# Patient Record
Sex: Male | Born: 1955 | Race: Black or African American | Hispanic: No | Marital: Single
Health system: Southern US, Community
[De-identification: ages and names within clinical notes are randomized; demographics above are authoritative.]

---

## 2004-05-12 ENCOUNTER — Other Ambulatory Visit: Payer: Self-pay

## 2004-06-02 ENCOUNTER — Ambulatory Visit: Payer: Self-pay | Admitting: Family Medicine

## 2004-06-14 ENCOUNTER — Ambulatory Visit: Payer: Self-pay | Admitting: Family Medicine

## 2004-07-14 ENCOUNTER — Ambulatory Visit: Payer: Self-pay | Admitting: Family Medicine

## 2004-09-04 ENCOUNTER — Inpatient Hospital Stay: Payer: Self-pay | Admitting: Anesthesiology

## 2004-11-14 ENCOUNTER — Emergency Department: Payer: Self-pay | Admitting: Internal Medicine

## 2007-03-08 ENCOUNTER — Other Ambulatory Visit: Payer: Self-pay

## 2007-03-08 ENCOUNTER — Inpatient Hospital Stay: Payer: Self-pay | Admitting: Internal Medicine

## 2008-03-02 ENCOUNTER — Ambulatory Visit: Payer: Self-pay | Admitting: Family Medicine

## 2008-12-02 ENCOUNTER — Emergency Department: Payer: Self-pay | Admitting: Emergency Medicine

## 2008-12-05 ENCOUNTER — Emergency Department: Payer: Self-pay | Admitting: Emergency Medicine

## 2009-04-26 ENCOUNTER — Ambulatory Visit: Payer: Self-pay | Admitting: Gastroenterology

## 2010-02-15 ENCOUNTER — Ambulatory Visit: Payer: Self-pay | Admitting: Family Medicine

## 2010-07-04 ENCOUNTER — Emergency Department: Payer: Self-pay | Admitting: Emergency Medicine

## 2010-10-16 ENCOUNTER — Emergency Department: Payer: Self-pay | Admitting: Emergency Medicine

## 2010-12-31 ENCOUNTER — Emergency Department: Payer: Self-pay | Admitting: Emergency Medicine

## 2011-01-03 ENCOUNTER — Encounter: Payer: Self-pay | Admitting: Student

## 2011-01-13 ENCOUNTER — Encounter: Payer: Self-pay | Admitting: Student

## 2011-02-06 ENCOUNTER — Emergency Department: Payer: Self-pay | Admitting: Emergency Medicine

## 2011-06-29 ENCOUNTER — Ambulatory Visit: Payer: Self-pay | Admitting: Family Medicine

## 2012-05-01 ENCOUNTER — Inpatient Hospital Stay: Payer: Self-pay | Admitting: Specialist

## 2012-05-01 LAB — CBC WITH DIFFERENTIAL/PLATELET
Basophil #: 0 10*3/uL (ref 0.0–0.1)
Eosinophil #: 0.2 10*3/uL (ref 0.0–0.7)
Eosinophil %: 0.8 %
HCT: 51.1 % (ref 40.0–52.0)
Lymphocyte %: 3.5 %
MCV: 78 fL — ABNORMAL LOW (ref 80–100)
Neutrophil #: 19.6 10*3/uL — ABNORMAL HIGH (ref 1.4–6.5)
Neutrophil %: 89.3 %
RDW: 16.9 % — ABNORMAL HIGH (ref 11.5–14.5)
WBC: 22 10*3/uL — ABNORMAL HIGH (ref 3.8–10.6)

## 2012-05-01 LAB — URINALYSIS, COMPLETE
Nitrite: NEGATIVE
Ph: 6 (ref 4.5–8.0)
Protein: 100
Specific Gravity: 1.016 (ref 1.003–1.030)

## 2012-05-01 LAB — COMPREHENSIVE METABOLIC PANEL
BUN: 70 mg/dL — ABNORMAL HIGH (ref 7–18)
Bilirubin,Total: 0.9 mg/dL (ref 0.2–1.0)
Chloride: 80 mmol/L — ABNORMAL LOW (ref 98–107)
Co2: 15 mmol/L — ABNORMAL LOW (ref 21–32)
Creatinine: 2.88 mg/dL — ABNORMAL HIGH (ref 0.60–1.30)
EGFR (African American): 27 — ABNORMAL LOW
Glucose: 753 mg/dL (ref 65–99)
Potassium: 4.4 mmol/L (ref 3.5–5.1)
SGOT(AST): 15 U/L (ref 15–37)
Sodium: 123 mmol/L — ABNORMAL LOW (ref 136–145)
Total Protein: 7.5 g/dL (ref 6.4–8.2)

## 2012-05-01 LAB — TROPONIN I: Troponin-I: <0.02 ng/mL

## 2012-05-02 LAB — COMPREHENSIVE METABOLIC PANEL
Anion Gap: 9 (ref 7–16)
BUN: 73 mg/dL — ABNORMAL HIGH (ref 7–18)
Bilirubin,Total: 0.4 mg/dL (ref 0.2–1.0)
Chloride: 102 mmol/L (ref 98–107)
Co2: 29 mmol/L (ref 21–32)
Creatinine: 2.78 mg/dL — ABNORMAL HIGH (ref 0.60–1.30)
EGFR (African American): 29 — ABNORMAL LOW
EGFR (Non-African Amer.): 25 — ABNORMAL LOW
Glucose: 67 mg/dL (ref 65–99)
Potassium: 3.7 mmol/L (ref 3.5–5.1)
SGOT(AST): 19 U/L (ref 15–37)
SGPT (ALT): 28 U/L (ref 12–78)
Sodium: 140 mmol/L (ref 136–145)
Total Protein: 6.1 g/dL — ABNORMAL LOW (ref 6.4–8.2)

## 2012-05-02 LAB — CBC WITH DIFFERENTIAL/PLATELET
Basophil #: 0 10*3/uL (ref 0.0–0.1)
Basophil %: 0.1 %
Eosinophil #: 0.2 10*3/uL (ref 0.0–0.7)
HCT: 40.8 % (ref 40.0–52.0)
HGB: 13.4 g/dL (ref 13.0–18.0)
Lymphocyte #: 1.4 10*3/uL (ref 1.0–3.6)
MCH: 23.5 pg — ABNORMAL LOW (ref 26.0–34.0)
MCHC: 32.9 g/dL (ref 32.0–36.0)
Monocyte #: 3 x10 3/mm — ABNORMAL HIGH (ref 0.2–1.0)
Neutrophil #: 18.4 10*3/uL — ABNORMAL HIGH (ref 1.4–6.5)
RBC: 5.73 10*6/uL (ref 4.40–5.90)
RDW: 15.7 % — ABNORMAL HIGH (ref 11.5–14.5)

## 2012-05-02 LAB — LIPID PANEL
Cholesterol: 114 mg/dL (ref 0–200)
HDL Cholesterol: 6 mg/dL — ABNORMAL LOW (ref 40–60)
Triglycerides: 191 mg/dL (ref 0–200)
VLDL Cholesterol, Calc: 38 mg/dL (ref 5–40)

## 2012-05-02 LAB — PHOSPHORUS: Phosphorus: 1.4 mg/dL — ABNORMAL LOW (ref 2.5–4.9)

## 2012-05-02 LAB — MAGNESIUM: Magnesium: 2.3 mg/dL

## 2012-05-03 LAB — CBC WITH DIFFERENTIAL/PLATELET
Basophil #: 0.1 10*3/uL (ref 0.0–0.1)
Eosinophil #: 0.3 10*3/uL (ref 0.0–0.7)
Eosinophil %: 1.8 %
HCT: 38.9 % — ABNORMAL LOW (ref 40.0–52.0)
HGB: 12.4 g/dL — ABNORMAL LOW (ref 13.0–18.0)
Lymphocyte %: 12 %
Monocyte %: 10.4 %
Neutrophil #: 11 10*3/uL — ABNORMAL HIGH (ref 1.4–6.5)
Neutrophil %: 75.4 %
Platelet: 79 10*3/uL — ABNORMAL LOW (ref 150–440)
RBC: 5.43 10*6/uL (ref 4.40–5.90)
RDW: 16.2 % — ABNORMAL HIGH (ref 11.5–14.5)
WBC: 14.6 10*3/uL — ABNORMAL HIGH (ref 3.8–10.6)

## 2012-05-03 LAB — BASIC METABOLIC PANEL
Anion Gap: 7 (ref 7–16)
BUN: 48 mg/dL — ABNORMAL HIGH (ref 7–18)
EGFR (African American): 57 — ABNORMAL LOW
EGFR (Non-African Amer.): 49 — ABNORMAL LOW
Glucose: 238 mg/dL — ABNORMAL HIGH (ref 65–99)
Osmolality: 296 (ref 275–301)
Sodium: 138 mmol/L (ref 136–145)

## 2012-05-03 LAB — URINE CULTURE

## 2012-09-21 ENCOUNTER — Inpatient Hospital Stay: Payer: Self-pay | Admitting: Internal Medicine

## 2012-09-21 LAB — COMPREHENSIVE METABOLIC PANEL
Albumin: 2.3 g/dL — ABNORMAL LOW (ref 3.4–5.0)
Bilirubin,Total: 0.2 mg/dL (ref 0.2–1.0)
Calcium, Total: 8.3 mg/dL — ABNORMAL LOW (ref 8.5–10.1)
Chloride: 109 mmol/L — ABNORMAL HIGH (ref 98–107)
Co2: 25 mmol/L (ref 21–32)
Creatinine: 2.07 mg/dL — ABNORMAL HIGH (ref 0.60–1.30)
EGFR (Non-African Amer.): 35 — ABNORMAL LOW
Glucose: 149 mg/dL — ABNORMAL HIGH (ref 65–99)
Potassium: 4.8 mmol/L (ref 3.5–5.1)
SGOT(AST): 69 U/L — ABNORMAL HIGH (ref 15–37)
SGPT (ALT): 36 U/L (ref 12–78)
Total Protein: 6.6 g/dL (ref 6.4–8.2)

## 2012-09-21 LAB — CBC WITH DIFFERENTIAL/PLATELET
Basophil #: 0.1 x10 3/mm 3 (ref 0.0–0.1)
Basophil %: 1 %
Eosinophil #: 0.1 x10 3/mm 3 (ref 0.0–0.7)
Eosinophil %: 0.5 %
HCT: 37.3 % — ABNORMAL LOW (ref 40.0–52.0)
HGB: 11.9 g/dL — ABNORMAL LOW (ref 13.0–18.0)
Lymphocyte %: 17.6 %
Lymphs Abs: 2.3 x10 3/mm 3 (ref 1.0–3.6)
MCH: 23.4 pg — ABNORMAL LOW (ref 26.0–34.0)
MCHC: 32 g/dL (ref 32.0–36.0)
MCV: 73 fL — ABNORMAL LOW (ref 80–100)
Monocyte #: 0.6 x10 3/mm (ref 0.2–1.0)
Monocyte %: 4.4 %
Neutrophil #: 10 x10 3/mm 3 — ABNORMAL HIGH (ref 1.4–6.5)
Neutrophil %: 76.5 %
Platelet: 248 x10 3/mm 3 (ref 150–440)
RBC: 5.1 x10 6/mm 3 (ref 4.40–5.90)
RDW: 14.8 % — ABNORMAL HIGH (ref 11.5–14.5)
WBC: 13.1 x10 3/mm 3 — ABNORMAL HIGH (ref 3.8–10.6)

## 2012-09-21 LAB — URINALYSIS, COMPLETE
Ketone: NEGATIVE
Nitrite: POSITIVE
Ph: 6 (ref 4.5–8.0)
Protein: 100
Specific Gravity: 1.01 (ref 1.003–1.030)
Squamous Epithelial: 2

## 2012-09-22 LAB — CBC WITH DIFFERENTIAL/PLATELET
Basophil: 4 %
Eosinophil: 6 %
HCT: 35.7 % — ABNORMAL LOW (ref 40.0–52.0)
HGB: 11.5 g/dL — ABNORMAL LOW (ref 13.0–18.0)
Lymphocytes: 40 %
MCH: 23.9 pg — ABNORMAL LOW (ref 26.0–34.0)
MCHC: 32.2 g/dL (ref 32.0–36.0)
MCV: 74 fL — ABNORMAL LOW (ref 80–100)
Monocytes: 5 %
Platelet: 273 10*3/uL (ref 150–440)
RBC: 4.82 10*6/uL (ref 4.40–5.90)
RDW: 15 % — ABNORMAL HIGH (ref 11.5–14.5)
Segmented Neutrophils: 31 %
Variant Lymphocyte - H1-Rlymph: 14 %
WBC: 10.3 10*3/uL (ref 3.8–10.6)

## 2012-09-22 LAB — BASIC METABOLIC PANEL
Calcium, Total: 7.9 mg/dL — ABNORMAL LOW (ref 8.5–10.1)
Chloride: 113 mmol/L — ABNORMAL HIGH (ref 98–107)
Co2: 24 mmol/L (ref 21–32)
Creatinine: 2.11 mg/dL — ABNORMAL HIGH (ref 0.60–1.30)
EGFR (African American): 40 — ABNORMAL LOW
EGFR (Non-African Amer.): 34 — ABNORMAL LOW
Osmolality: 282 (ref 275–301)
Potassium: 5.1 mmol/L (ref 3.5–5.1)
Sodium: 142 mmol/L (ref 136–145)

## 2012-09-22 LAB — CK: CK, Total: 1458 U/L — ABNORMAL HIGH (ref 35–232)

## 2012-09-23 LAB — BASIC METABOLIC PANEL
Creatinine: 2 mg/dL — ABNORMAL HIGH (ref 0.60–1.30)
EGFR (Non-African Amer.): 37 — ABNORMAL LOW
Osmolality: 292 (ref 275–301)
Sodium: 142 mmol/L (ref 136–145)

## 2012-09-23 LAB — CK: CK, Total: 813 U/L — ABNORMAL HIGH (ref 35–232)

## 2012-09-24 LAB — BASIC METABOLIC PANEL
BUN: 28 mg/dL — ABNORMAL HIGH (ref 7–18)
Chloride: 109 mmol/L — ABNORMAL HIGH (ref 98–107)
Co2: 21 mmol/L (ref 21–32)
Creatinine: 2.26 mg/dL — ABNORMAL HIGH (ref 0.60–1.30)
EGFR (African American): 37 — ABNORMAL LOW
Glucose: 409 mg/dL — ABNORMAL HIGH (ref 65–99)
Osmolality: 297 (ref 275–301)
Potassium: 5.4 mmol/L — ABNORMAL HIGH (ref 3.5–5.1)
Sodium: 137 mmol/L (ref 136–145)

## 2012-09-24 LAB — CBC WITH DIFFERENTIAL/PLATELET
Basophil #: 0 10*3/uL (ref 0.0–0.1)
Basophil %: 0.4 %
Eosinophil #: 0 10*3/uL (ref 0.0–0.7)
Eosinophil %: 0.1 %
Lymphocyte %: 17.5 %
MCHC: 31.9 g/dL — ABNORMAL LOW (ref 32.0–36.0)
MCV: 74 fL — ABNORMAL LOW (ref 80–100)
Monocyte #: 0.4 x10 3/mm (ref 0.2–1.0)
Monocyte %: 3.4 %
Neutrophil #: 8.1 10*3/uL — ABNORMAL HIGH (ref 1.4–6.5)
Neutrophil %: 78.6 %
Platelet: 222 10*3/uL (ref 150–440)
RBC: 4.79 10*6/uL (ref 4.40–5.90)
WBC: 10.3 10*3/uL (ref 3.8–10.6)

## 2012-09-24 LAB — POTASSIUM: Potassium: 5 mmol/L (ref 3.5–5.1)

## 2012-09-26 LAB — CULTURE, BLOOD (SINGLE)

## 2012-11-09 ENCOUNTER — Other Ambulatory Visit: Payer: Self-pay

## 2012-11-09 LAB — URINALYSIS, COMPLETE
Glucose,UR: NEGATIVE mg/dL (ref 0–75)
Hyaline Cast: 5
Ketone: NEGATIVE
Nitrite: NEGATIVE
Protein: >=500
Squamous Epithelial: 1

## 2012-11-11 LAB — URINE CULTURE

## 2012-11-23 ENCOUNTER — Other Ambulatory Visit: Payer: Self-pay

## 2012-11-23 LAB — BASIC METABOLIC PANEL
Anion Gap: 2 — ABNORMAL LOW (ref 7–16)
Chloride: 111 mmol/L — ABNORMAL HIGH (ref 98–107)
Creatinine: 2.12 mg/dL — ABNORMAL HIGH (ref 0.60–1.30)
EGFR (African American): 39 — ABNORMAL LOW
EGFR (Non-African Amer.): 34 — ABNORMAL LOW
Potassium: 5.2 mmol/L — ABNORMAL HIGH (ref 3.5–5.1)
Sodium: 138 mmol/L (ref 136–145)

## 2012-12-22 LAB — CBC WITH DIFFERENTIAL/PLATELET
Basophil #: 0.1 10*3/uL (ref 0.0–0.1)
Basophil %: 0.7 %
Eosinophil %: 0.4 %
HCT: 41.3 % (ref 40.0–52.0)
Lymphocyte #: 2.2 10*3/uL (ref 1.0–3.6)
MCH: 23.3 pg — ABNORMAL LOW (ref 26.0–34.0)
MCHC: 32.3 g/dL (ref 32.0–36.0)
MCV: 72 fL — ABNORMAL LOW (ref 80–100)
Monocyte %: 7.4 %
Neutrophil %: 74 %
Platelet: 258 10*3/uL (ref 150–440)
RDW: 16.1 % — ABNORMAL HIGH (ref 11.5–14.5)
WBC: 12.7 10*3/uL — ABNORMAL HIGH (ref 3.8–10.6)

## 2012-12-22 LAB — URINALYSIS, COMPLETE
Glucose,UR: >=500 mg/dL (ref 0–75)
Leukocyte Esterase: NEGATIVE
Nitrite: NEGATIVE
RBC,UR: 13 /HPF (ref 0–5)
Specific Gravity: 1.014 (ref 1.003–1.030)
Squamous Epithelial: <1

## 2012-12-22 LAB — COMPREHENSIVE METABOLIC PANEL
Albumin: 2.2 g/dL — ABNORMAL LOW (ref 3.4–5.0)
Anion Gap: 6 — ABNORMAL LOW (ref 7–16)
Bilirubin,Total: 0.3 mg/dL (ref 0.2–1.0)
Co2: 25 mmol/L (ref 21–32)
EGFR (Non-African Amer.): 34 — ABNORMAL LOW
Glucose: 413 mg/dL — ABNORMAL HIGH (ref 65–99)
Osmolality: 297 (ref 275–301)
SGPT (ALT): 35 U/L (ref 12–78)
Total Protein: 6.3 g/dL — ABNORMAL LOW (ref 6.4–8.2)

## 2012-12-22 LAB — TSH: Thyroid Stimulating Horm: 1.26 u[IU]/mL

## 2012-12-22 LAB — BETA-HYDROXYBUTYRIC ACID: Beta-Hydroxybutyrate: 3.2 mg/dL — ABNORMAL HIGH (ref 0.2–2.8)

## 2012-12-23 ENCOUNTER — Inpatient Hospital Stay: Payer: Self-pay | Admitting: Internal Medicine

## 2012-12-24 LAB — DRUG SCREEN, URINE
Barbiturates, Ur Screen: NEGATIVE (ref ?–200)
Benzodiazepine, Ur Scrn: NEGATIVE (ref ?–200)
Cannabinoid 50 Ng, Ur ~~LOC~~: NEGATIVE (ref ?–50)
Opiate, Ur Screen: NEGATIVE (ref ?–300)

## 2012-12-24 LAB — CBC WITH DIFFERENTIAL/PLATELET
Basophil #: 0.1 10*3/uL (ref 0.0–0.1)
Eosinophil #: 0.3 10*3/uL (ref 0.0–0.7)
Eosinophil %: 2.6 %
Lymphocyte %: 34.5 %
MCH: 23.5 pg — ABNORMAL LOW (ref 26.0–34.0)
MCHC: 32.7 g/dL (ref 32.0–36.0)
MCV: 72 fL — ABNORMAL LOW (ref 80–100)
Monocyte #: 0.7 x10 3/mm (ref 0.2–1.0)
Neutrophil #: 5.8 10*3/uL (ref 1.4–6.5)
Neutrophil %: 54.9 %
Platelet: 253 10*3/uL (ref 150–440)
RBC: 5.81 10*6/uL (ref 4.40–5.90)

## 2012-12-24 LAB — BASIC METABOLIC PANEL
Anion Gap: 7 (ref 7–16)
BUN: 32 mg/dL — ABNORMAL HIGH (ref 7–18)
Chloride: 110 mmol/L — ABNORMAL HIGH (ref 98–107)
Co2: 22 mmol/L (ref 21–32)
Creatinine: 2.14 mg/dL — ABNORMAL HIGH (ref 0.60–1.30)
EGFR (African American): 39 — ABNORMAL LOW
EGFR (Non-African Amer.): 34 — ABNORMAL LOW
Glucose: 263 mg/dL — ABNORMAL HIGH (ref 65–99)
Sodium: 139 mmol/L (ref 136–145)

## 2012-12-28 LAB — CULTURE, BLOOD (SINGLE)

## 2013-02-20 ENCOUNTER — Inpatient Hospital Stay: Payer: Self-pay | Admitting: Specialist

## 2013-02-20 LAB — URINALYSIS, COMPLETE
Bilirubin,UR: NEGATIVE
Glucose,UR: NEGATIVE mg/dL (ref 0–75)
Nitrite: NEGATIVE
Ph: 5 (ref 4.5–8.0)
Protein: >=500
Specific Gravity: 1.018 (ref 1.003–1.030)
WBC UR: 688 /HPF (ref 0–5)

## 2013-02-20 LAB — DRUG SCREEN, URINE
Amphetamines, Ur Screen: NEGATIVE (ref ?–1000)
Barbiturates, Ur Screen: NEGATIVE (ref ?–200)
Benzodiazepine, Ur Scrn: NEGATIVE (ref ?–200)
Cocaine Metabolite,Ur ~~LOC~~: POSITIVE (ref ?–300)
Methadone, Ur Screen: NEGATIVE (ref ?–300)
Opiate, Ur Screen: NEGATIVE (ref ?–300)
Phencyclidine (PCP) Ur S: NEGATIVE (ref ?–25)

## 2013-02-20 LAB — BASIC METABOLIC PANEL
Anion Gap: 5 — ABNORMAL LOW (ref 7–16)
Chloride: 113 mmol/L — ABNORMAL HIGH (ref 98–107)
Creatinine: 2.21 mg/dL — ABNORMAL HIGH (ref 0.60–1.30)
EGFR (African American): 37 — ABNORMAL LOW
EGFR (Non-African Amer.): 32 — ABNORMAL LOW
Glucose: 41 mg/dL — ABNORMAL LOW (ref 65–99)
Osmolality: 289 (ref 275–301)
Potassium: 5.2 mmol/L — ABNORMAL HIGH (ref 3.5–5.1)
Sodium: 144 mmol/L (ref 136–145)

## 2013-02-20 LAB — CBC
HCT: 40.9 % (ref 40.0–52.0)
HGB: 13.2 g/dL (ref 13.0–18.0)
MCHC: 32.2 g/dL (ref 32.0–36.0)
MCV: 72 fL — ABNORMAL LOW (ref 80–100)
Platelet: 355 10*3/uL (ref 150–440)
RDW: 16 % — ABNORMAL HIGH (ref 11.5–14.5)

## 2013-02-20 LAB — DIFFERENTIAL
Eosinophil #: 0 10*3/uL (ref 0.0–0.7)
Lymphocyte #: 1.9 10*3/uL (ref 1.0–3.6)
Neutrophil %: 90.9 %

## 2013-02-21 LAB — CBC WITH DIFFERENTIAL/PLATELET
Basophil %: 0.9 %
Eosinophil #: 0.1 10*3/uL (ref 0.0–0.7)
HGB: 11.8 g/dL — ABNORMAL LOW (ref 13.0–18.0)
Lymphocyte #: 3.1 10*3/uL (ref 1.0–3.6)
MCH: 23.3 pg — ABNORMAL LOW (ref 26.0–34.0)
MCHC: 32 g/dL (ref 32.0–36.0)
MCV: 73 fL — ABNORMAL LOW (ref 80–100)
Monocyte #: 1.3 x10 3/mm — ABNORMAL HIGH (ref 0.2–1.0)
Monocyte %: 7.2 %
RBC: 5.07 10*6/uL (ref 4.40–5.90)

## 2013-02-21 LAB — BASIC METABOLIC PANEL
Anion Gap: 8 (ref 7–16)
BUN: 32 mg/dL — ABNORMAL HIGH (ref 7–18)
Calcium, Total: 7.9 mg/dL — ABNORMAL LOW (ref 8.5–10.1)
Chloride: 113 mmol/L — ABNORMAL HIGH (ref 98–107)
Creatinine: 2.44 mg/dL — ABNORMAL HIGH (ref 0.60–1.30)
EGFR (Non-African Amer.): 29 — ABNORMAL LOW
Glucose: 16 mg/dL — CL (ref 65–99)
Osmolality: 295 (ref 275–301)
Potassium: 3.9 mmol/L (ref 3.5–5.1)
Sodium: 147 mmol/L — ABNORMAL HIGH (ref 136–145)

## 2013-02-21 LAB — MAGNESIUM: Magnesium: 1.8 mg/dL

## 2013-02-21 LAB — HEMOGLOBIN A1C: Hemoglobin A1C: 9.3 % — ABNORMAL HIGH (ref 4.2–6.3)

## 2013-02-22 LAB — CBC WITH DIFFERENTIAL/PLATELET
Basophil #: 0.2 10*3/uL — ABNORMAL HIGH (ref 0.0–0.1)
Eosinophil #: 0.4 10*3/uL (ref 0.0–0.7)
Eosinophil %: 3 %
HCT: 35.4 % — ABNORMAL LOW (ref 40.0–52.0)
Lymphocyte #: 5.1 10*3/uL — ABNORMAL HIGH (ref 1.0–3.6)
MCV: 72 fL — ABNORMAL LOW (ref 80–100)
Monocyte %: 8 %
Neutrophil #: 7.8 10*3/uL — ABNORMAL HIGH (ref 1.4–6.5)
Neutrophil %: 53.2 %
Platelet: 277 10*3/uL (ref 150–440)
RBC: 4.9 10*6/uL (ref 4.40–5.90)
RDW: 15.7 % — ABNORMAL HIGH (ref 11.5–14.5)

## 2013-02-22 LAB — BASIC METABOLIC PANEL
Anion Gap: 4 — ABNORMAL LOW (ref 7–16)
Calcium, Total: 7.8 mg/dL — ABNORMAL LOW (ref 8.5–10.1)
EGFR (African American): 30 — ABNORMAL LOW
EGFR (Non-African Amer.): 26 — ABNORMAL LOW
Glucose: 124 mg/dL — ABNORMAL HIGH (ref 65–99)
Osmolality: 293 (ref 275–301)

## 2013-02-22 LAB — URINE CULTURE

## 2013-02-23 LAB — CBC WITH DIFFERENTIAL/PLATELET
Eosinophil #: 0.7 10*3/uL (ref 0.0–0.7)
HCT: 36.7 % — ABNORMAL LOW (ref 40.0–52.0)
HGB: 11.7 g/dL — ABNORMAL LOW (ref 13.0–18.0)
Lymphocyte #: 4 10*3/uL — ABNORMAL HIGH (ref 1.0–3.6)
MCHC: 31.9 g/dL — ABNORMAL LOW (ref 32.0–36.0)
MCV: 72 fL — ABNORMAL LOW (ref 80–100)
Monocyte #: 1 x10 3/mm (ref 0.2–1.0)
Monocyte %: 7.9 %
RBC: 5.09 10*6/uL (ref 4.40–5.90)
WBC: 12.7 10*3/uL — ABNORMAL HIGH (ref 3.8–10.6)

## 2013-02-23 LAB — BASIC METABOLIC PANEL
BUN: 31 mg/dL — ABNORMAL HIGH (ref 7–18)
Co2: 26 mmol/L (ref 21–32)
Osmolality: 294 (ref 275–301)
Sodium: 143 mmol/L (ref 136–145)

## 2013-05-13 ENCOUNTER — Inpatient Hospital Stay: Payer: Self-pay | Admitting: Internal Medicine

## 2013-05-13 LAB — PROTIME-INR
INR: 1.1
Prothrombin Time: 14.4 secs (ref 11.5–14.7)

## 2013-05-13 LAB — BASIC METABOLIC PANEL
Chloride: 114 mmol/L — ABNORMAL HIGH (ref 98–107)
EGFR (African American): 30 — ABNORMAL LOW
EGFR (Non-African Amer.): 26 — ABNORMAL LOW
Osmolality: 284 (ref 275–301)
Potassium: 4.4 mmol/L (ref 3.5–5.1)
Sodium: 143 mmol/L (ref 136–145)

## 2013-05-13 LAB — CBC WITH DIFFERENTIAL/PLATELET
Basophil #: 0.1 10*3/uL (ref 0.0–0.1)
Eosinophil %: 3.7 %
HCT: 38.4 % — ABNORMAL LOW (ref 40.0–52.0)
Lymphocyte #: 4.3 10*3/uL — ABNORMAL HIGH (ref 1.0–3.6)
Lymphocyte %: 41.9 %
MCH: 23.2 pg — ABNORMAL LOW (ref 26.0–34.0)
MCV: 72 fL — ABNORMAL LOW (ref 80–100)
Neutrophil #: 4.7 10*3/uL (ref 1.4–6.5)
RBC: 5.32 10*6/uL (ref 4.40–5.90)
RDW: 15.9 % — ABNORMAL HIGH (ref 11.5–14.5)

## 2013-05-13 LAB — MAGNESIUM: Magnesium: 1.5 mg/dL — ABNORMAL LOW

## 2013-05-13 LAB — APTT: Activated PTT: 36.5 secs — ABNORMAL HIGH (ref 23.6–35.9)

## 2013-05-14 LAB — TSH: Thyroid Stimulating Horm: 0.754 u[IU]/mL

## 2013-05-14 LAB — BASIC METABOLIC PANEL
Anion Gap: 5 — ABNORMAL LOW (ref 7–16)
Calcium, Total: 8.5 mg/dL (ref 8.5–10.1)
Co2: 24 mmol/L (ref 21–32)
EGFR (African American): 39 — ABNORMAL LOW
EGFR (Non-African Amer.): 34 — ABNORMAL LOW
Glucose: 134 mg/dL — ABNORMAL HIGH (ref 65–99)
Osmolality: 284 (ref 275–301)

## 2013-05-14 LAB — CBC WITH DIFFERENTIAL/PLATELET
HCT: 37.9 % — ABNORMAL LOW (ref 40.0–52.0)
HGB: 12.3 g/dL — ABNORMAL LOW (ref 13.0–18.0)
Lymphocyte #: 3.2 10*3/uL (ref 1.0–3.6)
Lymphocyte %: 22.9 %
MCH: 23.3 pg — ABNORMAL LOW (ref 26.0–34.0)
MCHC: 32.4 g/dL (ref 32.0–36.0)
MCV: 72 fL — ABNORMAL LOW (ref 80–100)
Monocyte #: 0.7 x10 3/mm (ref 0.2–1.0)
Neutrophil #: 9.8 10*3/uL — ABNORMAL HIGH (ref 1.4–6.5)
Neutrophil %: 69.6 %
Platelet: 284 10*3/uL (ref 150–440)
WBC: 14.1 10*3/uL — ABNORMAL HIGH (ref 3.8–10.6)

## 2013-05-14 LAB — LIPID PANEL
Cholesterol: 133 mg/dL (ref 0–200)
HDL Cholesterol: 29 mg/dL — ABNORMAL LOW (ref 40–60)
Ldl Cholesterol, Calc: 85 mg/dL (ref 0–100)
Triglycerides: 93 mg/dL (ref 0–200)

## 2013-05-16 ENCOUNTER — Ambulatory Visit: Payer: Self-pay | Admitting: Internal Medicine

## 2013-05-16 LAB — CBC WITH DIFFERENTIAL/PLATELET
Basophil #: 0.1 10*3/uL (ref 0.0–0.1)
Basophil %: 0.6 %
Eosinophil %: 0.9 %
HCT: 38.2 % — ABNORMAL LOW (ref 40.0–52.0)
HGB: 12.2 g/dL — ABNORMAL LOW (ref 13.0–18.0)
Lymphocyte #: 2.6 10*3/uL (ref 1.0–3.6)
Lymphocyte %: 18.7 %
MCH: 23.2 pg — ABNORMAL LOW (ref 26.0–34.0)
MCHC: 32 g/dL (ref 32.0–36.0)
Monocyte #: 0.9 x10 3/mm (ref 0.2–1.0)
Neutrophil #: 10.1 10*3/uL — ABNORMAL HIGH (ref 1.4–6.5)
Neutrophil %: 73.4 %
RBC: 5.26 10*6/uL (ref 4.40–5.90)

## 2013-05-16 LAB — BASIC METABOLIC PANEL
Anion Gap: 10 (ref 7–16)
Calcium, Total: 8.8 mg/dL (ref 8.5–10.1)
Chloride: 117 mmol/L — ABNORMAL HIGH (ref 98–107)
Co2: 18 mmol/L — ABNORMAL LOW (ref 21–32)
Creatinine: 2.29 mg/dL — ABNORMAL HIGH (ref 0.60–1.30)
EGFR (African American): 36 — ABNORMAL LOW
Glucose: 188 mg/dL — ABNORMAL HIGH (ref 65–99)
Osmolality: 298 (ref 275–301)
Potassium: 5.6 mmol/L — ABNORMAL HIGH (ref 3.5–5.1)

## 2013-05-16 LAB — MAGNESIUM: Magnesium: 2.1 mg/dL

## 2013-05-16 LAB — POTASSIUM: Potassium: 5.7 mmol/L — ABNORMAL HIGH (ref 3.5–5.1)

## 2013-05-17 LAB — CBC WITH DIFFERENTIAL/PLATELET
Basophil #: 0.1 10*3/uL (ref 0.0–0.1)
Basophil %: 0.5 %
Eosinophil %: 2.9 %
HGB: 11.8 g/dL — ABNORMAL LOW (ref 13.0–18.0)
Lymphocyte %: 25.8 %
MCH: 23.4 pg — ABNORMAL LOW (ref 26.0–34.0)
MCHC: 32.3 g/dL (ref 32.0–36.0)
Monocyte #: 1 x10 3/mm (ref 0.2–1.0)
Monocyte %: 7.9 %
Neutrophil %: 62.9 %
Platelet: 286 10*3/uL (ref 150–440)
RDW: 16 % — ABNORMAL HIGH (ref 11.5–14.5)
WBC: 12.2 10*3/uL — ABNORMAL HIGH (ref 3.8–10.6)

## 2013-05-17 LAB — BASIC METABOLIC PANEL
Anion Gap: 10 (ref 7–16)
BUN: 25 mg/dL — ABNORMAL HIGH (ref 7–18)
Calcium, Total: 8.7 mg/dL (ref 8.5–10.1)
Chloride: 122 mmol/L — ABNORMAL HIGH (ref 98–107)
Co2: 18 mmol/L — ABNORMAL LOW (ref 21–32)
EGFR (African American): 36 — ABNORMAL LOW
EGFR (Non-African Amer.): 31 — ABNORMAL LOW
Glucose: 165 mg/dL — ABNORMAL HIGH (ref 65–99)
Potassium: 5 mmol/L (ref 3.5–5.1)
Sodium: 150 mmol/L — ABNORMAL HIGH (ref 136–145)

## 2013-05-18 LAB — BASIC METABOLIC PANEL
Anion Gap: 11 (ref 7–16)
BUN: 23 mg/dL — ABNORMAL HIGH (ref 7–18)
Calcium, Total: 8.8 mg/dL (ref 8.5–10.1)
Chloride: 119 mmol/L — ABNORMAL HIGH (ref 98–107)
Creatinine: 2.2 mg/dL — ABNORMAL HIGH (ref 0.60–1.30)
EGFR (African American): 38 — ABNORMAL LOW
EGFR (Non-African Amer.): 33 — ABNORMAL LOW
Glucose: 198 mg/dL — ABNORMAL HIGH (ref 65–99)
Osmolality: 303 (ref 275–301)
Potassium: 4.4 mmol/L (ref 3.5–5.1)

## 2013-05-19 LAB — BASIC METABOLIC PANEL
Anion Gap: 9 (ref 7–16)
BUN: 25 mg/dL — ABNORMAL HIGH (ref 7–18)
Calcium, Total: 8.3 mg/dL — ABNORMAL LOW (ref 8.5–10.1)
Chloride: 122 mmol/L — ABNORMAL HIGH (ref 98–107)
Co2: 17 mmol/L — ABNORMAL LOW (ref 21–32)
Creatinine: 3.8 mg/dL — ABNORMAL HIGH (ref 0.60–1.30)
EGFR (Non-African Amer.): 17 — ABNORMAL LOW
Osmolality: 307 (ref 275–301)
Potassium: 5.8 mmol/L — ABNORMAL HIGH (ref 3.5–5.1)

## 2013-05-20 LAB — BASIC METABOLIC PANEL
Anion Gap: 7 (ref 7–16)
Chloride: 113 mmol/L — ABNORMAL HIGH (ref 98–107)
Co2: 21 mmol/L (ref 21–32)
Creatinine: 2.14 mg/dL — ABNORMAL HIGH (ref 0.60–1.30)
EGFR (Non-African Amer.): 34 — ABNORMAL LOW
Osmolality: 290 (ref 275–301)
Potassium: 3.9 mmol/L (ref 3.5–5.1)
Sodium: 141 mmol/L (ref 136–145)

## 2013-05-20 LAB — PHOSPHORUS: Phosphorus: 3.1 mg/dL (ref 2.5–4.9)

## 2013-05-21 LAB — PLATELET COUNT: Platelet: 202 10*3/uL (ref 150–440)

## 2013-06-04 LAB — COMPREHENSIVE METABOLIC PANEL
Albumin: 2.9 g/dL — ABNORMAL LOW (ref 3.4–5.0)
Anion Gap: 3 — ABNORMAL LOW (ref 7–16)
Calcium, Total: 10 mg/dL (ref 8.5–10.1)
Co2: 24 mmol/L (ref 21–32)
Creatinine: 3.69 mg/dL — ABNORMAL HIGH (ref 0.60–1.30)
EGFR (African American): 20 — ABNORMAL LOW
Glucose: 306 mg/dL — ABNORMAL HIGH (ref 65–99)
Osmolality: 325 (ref 275–301)
Potassium: 7.7 mmol/L (ref 3.5–5.1)
Sodium: 144 mmol/L (ref 136–145)

## 2013-06-04 LAB — CBC WITH DIFFERENTIAL/PLATELET
Eosinophil #: 0.1 10*3/uL (ref 0.0–0.7)
Eosinophil %: 0.8 %
HCT: 46.3 % (ref 40.0–52.0)
HGB: 14.7 g/dL (ref 13.0–18.0)
Lymphocyte #: 2.7 10*3/uL (ref 1.0–3.6)
MCHC: 31.7 g/dL — ABNORMAL LOW (ref 32.0–36.0)
MCV: 74 fL — ABNORMAL LOW (ref 80–100)
Monocyte #: 0.8 x10 3/mm (ref 0.2–1.0)
Monocyte %: 4.6 %
Neutrophil #: 13.9 10*3/uL — ABNORMAL HIGH (ref 1.4–6.5)
Neutrophil %: 79 %
RBC: 6.26 10*6/uL — ABNORMAL HIGH (ref 4.40–5.90)
WBC: 17.6 10*3/uL — ABNORMAL HIGH (ref 3.8–10.6)

## 2013-06-05 ENCOUNTER — Inpatient Hospital Stay: Payer: Self-pay | Admitting: Internal Medicine

## 2013-06-05 LAB — URINALYSIS, COMPLETE
Bilirubin,UR: NEGATIVE
Glucose,UR: NEGATIVE mg/dL (ref 0–75)
Hyaline Cast: 4
Ketone: NEGATIVE
Nitrite: NEGATIVE
Ph: 6 (ref 4.5–8.0)
RBC,UR: 6 /HPF (ref 0–5)

## 2013-06-05 LAB — BASIC METABOLIC PANEL
Anion Gap: 4 — ABNORMAL LOW (ref 7–16)
Anion Gap: 5 — ABNORMAL LOW (ref 7–16)
BUN: 90 mg/dL — ABNORMAL HIGH (ref 7–18)
Calcium, Total: 9.3 mg/dL (ref 8.5–10.1)
Chloride: 119 mmol/L — ABNORMAL HIGH (ref 98–107)
Chloride: 119 mmol/L — ABNORMAL HIGH (ref 98–107)
Co2: 24 mmol/L (ref 21–32)
Creatinine: 3.78 mg/dL — ABNORMAL HIGH (ref 0.60–1.30)
Creatinine: 3.85 mg/dL — ABNORMAL HIGH (ref 0.60–1.30)
EGFR (African American): 20 — ABNORMAL LOW
EGFR (African American): 19 — ABNORMAL LOW
Glucose: 101 mg/dL — ABNORMAL HIGH (ref 65–99)
Osmolality: 330 (ref 275–301)
Osmolality: 322 (ref 275–301)
Potassium: 7.1 mmol/L (ref 3.5–5.1)
Potassium: 7.9 mmol/L (ref 3.5–5.1)
Sodium: 148 mmol/L — ABNORMAL HIGH (ref 136–145)
Sodium: 148 mmol/L — ABNORMAL HIGH (ref 136–145)

## 2013-06-05 LAB — POTASSIUM
Potassium: 7.3 mmol/L (ref 3.5–5.1)
Potassium: 7.4 mmol/L (ref 3.5–5.1)

## 2013-06-05 LAB — CK: CK, Total: 166 U/L (ref 35–232)

## 2013-06-06 LAB — RENAL FUNCTION PANEL
Anion Gap: 6 — ABNORMAL LOW (ref 7–16)
Co2: 27 mmol/L (ref 21–32)
Creatinine: 3.74 mg/dL — ABNORMAL HIGH (ref 0.60–1.30)
EGFR (African American): 20 — ABNORMAL LOW
EGFR (Non-African Amer.): 17 — ABNORMAL LOW
Glucose: 483 mg/dL — ABNORMAL HIGH (ref 65–99)
Osmolality: 333 (ref 275–301)
Sodium: 146 mmol/L — ABNORMAL HIGH (ref 136–145)

## 2013-06-06 LAB — CBC WITH DIFFERENTIAL/PLATELET
Basophil #: 0.2 10*3/uL — ABNORMAL HIGH (ref 0.0–0.1)
Eosinophil %: 0.7 %
HCT: 36.4 % — ABNORMAL LOW (ref 40.0–52.0)
HGB: 11.3 g/dL — ABNORMAL LOW (ref 13.0–18.0)
Lymphocyte #: 1.8 10*3/uL (ref 1.0–3.6)
MCHC: 30.9 g/dL — ABNORMAL LOW (ref 32.0–36.0)
Monocyte #: 1.4 x10 3/mm — ABNORMAL HIGH (ref 0.2–1.0)
Neutrophil #: 17.8 10*3/uL — ABNORMAL HIGH (ref 1.4–6.5)
Platelet: 280 10*3/uL (ref 150–440)
RBC: 4.87 10*6/uL (ref 4.40–5.90)
RDW: 16 % — ABNORMAL HIGH (ref 11.5–14.5)
WBC: 21.3 10*3/uL — ABNORMAL HIGH (ref 3.8–10.6)

## 2013-06-07 LAB — RENAL FUNCTION PANEL
Albumin: 2 g/dL — ABNORMAL LOW (ref 3.4–5.0)
Anion Gap: 5 — ABNORMAL LOW (ref 7–16)
Calcium, Total: 8.5 mg/dL (ref 8.5–10.1)
Chloride: 110 mmol/L — ABNORMAL HIGH (ref 98–107)
Creatinine: 2.96 mg/dL — ABNORMAL HIGH (ref 0.60–1.30)
EGFR (African American): 26 — ABNORMAL LOW
EGFR (Non-African Amer.): 23 — ABNORMAL LOW
Glucose: 306 mg/dL — ABNORMAL HIGH (ref 65–99)
Osmolality: 313 (ref 275–301)
Potassium: 4.2 mmol/L (ref 3.5–5.1)

## 2013-06-08 LAB — CBC WITH DIFFERENTIAL/PLATELET
Basophil #: 0.1 10*3/uL (ref 0.0–0.1)
Basophil %: 0.8 %
HGB: 10.5 g/dL — ABNORMAL LOW (ref 13.0–18.0)
Lymphocyte #: 2.3 10*3/uL (ref 1.0–3.6)
Lymphocyte %: 12.7 %
MCHC: 31 g/dL — ABNORMAL LOW (ref 32.0–36.0)
MCV: 74 fL — ABNORMAL LOW (ref 80–100)
Monocyte #: 1.9 x10 3/mm — ABNORMAL HIGH (ref 0.2–1.0)
Neutrophil %: 74.6 %
Platelet: 224 10*3/uL (ref 150–440)

## 2013-06-08 LAB — BASIC METABOLIC PANEL
Anion Gap: 9 (ref 7–16)
BUN: 51 mg/dL — ABNORMAL HIGH (ref 7–18)
Co2: 25 mmol/L (ref 21–32)
Creatinine: 3.26 mg/dL — ABNORMAL HIGH (ref 0.60–1.30)
EGFR (African American): 23 — ABNORMAL LOW
EGFR (Non-African Amer.): 20 — ABNORMAL LOW
Glucose: 276 mg/dL — ABNORMAL HIGH (ref 65–99)
Osmolality: 318 (ref 275–301)
Potassium: 4.2 mmol/L (ref 3.5–5.1)
Sodium: 148 mmol/L — ABNORMAL HIGH (ref 136–145)

## 2013-06-09 LAB — CBC WITH DIFFERENTIAL/PLATELET
Basophil #: 0.2 10*3/uL — ABNORMAL HIGH (ref 0.0–0.1)
Eosinophil #: 0.9 10*3/uL — ABNORMAL HIGH (ref 0.0–0.7)
Eosinophil %: 6.5 %
HCT: 35.9 % — ABNORMAL LOW (ref 40.0–52.0)
HGB: 11.2 g/dL — ABNORMAL LOW (ref 13.0–18.0)
Lymphocyte #: 2 10*3/uL (ref 1.0–3.6)
MCHC: 31.1 g/dL — ABNORMAL LOW (ref 32.0–36.0)
MCV: 74 fL — ABNORMAL LOW (ref 80–100)
Monocyte %: 11.7 %
Neutrophil %: 66.5 %
Platelet: 234 10*3/uL (ref 150–440)
RDW: 16.1 % — ABNORMAL HIGH (ref 11.5–14.5)

## 2013-06-09 LAB — BASIC METABOLIC PANEL
Chloride: 116 mmol/L — ABNORMAL HIGH (ref 98–107)
Co2: 26 mmol/L (ref 21–32)
Creatinine: 2.9 mg/dL — ABNORMAL HIGH (ref 0.60–1.30)
EGFR (African American): 27 — ABNORMAL LOW
EGFR (Non-African Amer.): 23 — ABNORMAL LOW
Potassium: 3.7 mmol/L (ref 3.5–5.1)
Sodium: 150 mmol/L — ABNORMAL HIGH (ref 136–145)

## 2013-06-10 LAB — BASIC METABOLIC PANEL
Anion Gap: 5 — ABNORMAL LOW (ref 7–16)
Calcium, Total: 8.5 mg/dL (ref 8.5–10.1)
Chloride: 114 mmol/L — ABNORMAL HIGH (ref 98–107)
EGFR (African American): 27 — ABNORMAL LOW
Glucose: 260 mg/dL — ABNORMAL HIGH (ref 65–99)
Potassium: 3.5 mmol/L (ref 3.5–5.1)

## 2013-06-10 LAB — CULTURE, BLOOD (SINGLE)

## 2013-06-10 LAB — CBC WITH DIFFERENTIAL/PLATELET
Basophil #: 0.1 10*3/uL (ref 0.0–0.1)
Basophil %: 0.6 %
Eosinophil #: 1 10*3/uL — ABNORMAL HIGH (ref 0.0–0.7)
Eosinophil %: 8.7 %
HCT: 31.6 % — ABNORMAL LOW (ref 40.0–52.0)
HGB: 10.1 g/dL — ABNORMAL LOW (ref 13.0–18.0)
Lymphocyte #: 2.5 10*3/uL (ref 1.0–3.6)
Lymphocyte %: 22.6 %
MCH: 23.4 pg — ABNORMAL LOW (ref 26.0–34.0)
Monocyte %: 13 %
Neutrophil #: 6.1 10*3/uL (ref 1.4–6.5)
WBC: 11 10*3/uL — ABNORMAL HIGH (ref 3.8–10.6)

## 2013-06-11 LAB — BASIC METABOLIC PANEL
Anion Gap: 4 — ABNORMAL LOW (ref 7–16)
BUN: 33 mg/dL — ABNORMAL HIGH (ref 7–18)
Calcium, Total: 8.4 mg/dL — ABNORMAL LOW (ref 8.5–10.1)
Chloride: 111 mmol/L — ABNORMAL HIGH (ref 98–107)
EGFR (Non-African Amer.): 27 — ABNORMAL LOW
Glucose: 80 mg/dL (ref 65–99)
Osmolality: 295 (ref 275–301)

## 2013-06-11 LAB — CBC WITH DIFFERENTIAL/PLATELET
Basophil %: 1.1 %
Eosinophil #: 1.3 10*3/uL — ABNORMAL HIGH (ref 0.0–0.7)
HGB: 10.9 g/dL — ABNORMAL LOW (ref 13.0–18.0)
Lymphocyte %: 25.7 %
MCHC: 31.5 g/dL — ABNORMAL LOW (ref 32.0–36.0)
MCV: 73 fL — ABNORMAL LOW (ref 80–100)
Monocyte #: 1.5 x10 3/mm — ABNORMAL HIGH (ref 0.2–1.0)
Monocyte %: 12.1 %
Neutrophil #: 6.6 10*3/uL — ABNORMAL HIGH (ref 1.4–6.5)
Platelet: 247 10*3/uL (ref 150–440)
RBC: 4.7 10*6/uL (ref 4.40–5.90)
RDW: 15.5 % — ABNORMAL HIGH (ref 11.5–14.5)
WBC: 12.8 10*3/uL — ABNORMAL HIGH (ref 3.8–10.6)

## 2013-06-12 LAB — BASIC METABOLIC PANEL
Anion Gap: 6 — ABNORMAL LOW (ref 7–16)
BUN: 30 mg/dL — ABNORMAL HIGH (ref 7–18)
Calcium, Total: 8 mg/dL — ABNORMAL LOW (ref 8.5–10.1)
Co2: 26 mmol/L (ref 21–32)
Creatinine: 2.42 mg/dL — ABNORMAL HIGH (ref 0.60–1.30)
EGFR (African American): 34 — ABNORMAL LOW
EGFR (Non-African Amer.): 29 — ABNORMAL LOW
Glucose: 224 mg/dL — ABNORMAL HIGH (ref 65–99)
Potassium: 3.7 mmol/L (ref 3.5–5.1)

## 2013-06-13 LAB — BASIC METABOLIC PANEL
Anion Gap: 4 — ABNORMAL LOW (ref 7–16)
BUN: 25 mg/dL — ABNORMAL HIGH (ref 7–18)
Chloride: 107 mmol/L (ref 98–107)
Co2: 29 mmol/L (ref 21–32)
Creatinine: 2.12 mg/dL — ABNORMAL HIGH (ref 0.60–1.30)
EGFR (African American): 39 — ABNORMAL LOW
Glucose: 70 mg/dL (ref 65–99)
Osmolality: 282 (ref 275–301)
Potassium: 3.3 mmol/L — ABNORMAL LOW (ref 3.5–5.1)

## 2013-06-14 ENCOUNTER — Ambulatory Visit: Payer: Self-pay | Admitting: Internal Medicine

## 2013-06-14 LAB — CBC WITH DIFFERENTIAL/PLATELET
Basophil %: 1.4 %
Eosinophil #: 0.8 10*3/uL — ABNORMAL HIGH (ref 0.0–0.7)
HGB: 10.4 g/dL — ABNORMAL LOW (ref 13.0–18.0)
Lymphocyte #: 2.9 10*3/uL (ref 1.0–3.6)
Lymphocyte %: 31.4 %
MCH: 23.1 pg — ABNORMAL LOW (ref 26.0–34.0)
MCV: 72 fL — ABNORMAL LOW (ref 80–100)
Monocyte %: 8.2 %
Neutrophil #: 4.6 10*3/uL (ref 1.4–6.5)
Neutrophil %: 49.8 %
Platelet: 251 10*3/uL (ref 150–440)
RBC: 4.49 10*6/uL (ref 4.40–5.90)

## 2013-06-14 LAB — BASIC METABOLIC PANEL
Anion Gap: 3 — ABNORMAL LOW (ref 7–16)
Chloride: 106 mmol/L (ref 98–107)
Co2: 28 mmol/L (ref 21–32)
EGFR (Non-African Amer.): 38 — ABNORMAL LOW
Glucose: 348 mg/dL — ABNORMAL HIGH (ref 65–99)
Sodium: 137 mmol/L (ref 136–145)

## 2013-06-15 LAB — BASIC METABOLIC PANEL
BUN: 32 mg/dL — ABNORMAL HIGH (ref 7–18)
Calcium, Total: 8.3 mg/dL — ABNORMAL LOW (ref 8.5–10.1)
Chloride: 109 mmol/L — ABNORMAL HIGH (ref 98–107)
Creatinine: 2 mg/dL — ABNORMAL HIGH (ref 0.60–1.30)
EGFR (African American): 42 — ABNORMAL LOW
Osmolality: 298 (ref 275–301)
Potassium: 4.2 mmol/L (ref 3.5–5.1)
Sodium: 142 mmol/L (ref 136–145)

## 2013-06-16 LAB — CBC WITH DIFFERENTIAL/PLATELET
Basophil #: 0 10*3/uL (ref 0.0–0.1)
Basophil %: 0.2 %
Eosinophil %: 6.6 %
HCT: 32.6 % — ABNORMAL LOW (ref 40.0–52.0)
HGB: 10.4 g/dL — ABNORMAL LOW (ref 13.0–18.0)
Lymphocyte #: 3.3 10*3/uL (ref 1.0–3.6)
MCH: 23.3 pg — ABNORMAL LOW (ref 26.0–34.0)
MCHC: 32 g/dL (ref 32.0–36.0)
Monocyte %: 7.5 %
Neutrophil #: 7.7 10*3/uL — ABNORMAL HIGH (ref 1.4–6.5)
Platelet: 292 10*3/uL (ref 150–440)

## 2013-06-20 ENCOUNTER — Inpatient Hospital Stay: Payer: Self-pay | Admitting: Family Medicine

## 2013-06-20 LAB — CBC
HCT: 40.9 % (ref 40.0–52.0)
HGB: 13.1 g/dL (ref 13.0–18.0)
MCH: 23.2 pg — ABNORMAL LOW (ref 26.0–34.0)
MCHC: 32 g/dL (ref 32.0–36.0)
Platelet: 436 10*3/uL (ref 150–440)
RDW: 15.9 % — ABNORMAL HIGH (ref 11.5–14.5)
WBC: 25.1 10*3/uL — ABNORMAL HIGH (ref 3.8–10.6)

## 2013-06-20 LAB — COMPREHENSIVE METABOLIC PANEL
BUN: 47 mg/dL — ABNORMAL HIGH (ref 7–18)
Co2: 32 mmol/L (ref 21–32)
EGFR (African American): 28 — ABNORMAL LOW
EGFR (Non-African Amer.): 24 — ABNORMAL LOW
Glucose: 116 mg/dL — ABNORMAL HIGH (ref 65–99)
Osmolality: 315 (ref 275–301)
SGOT(AST): 31 U/L (ref 15–37)
SGPT (ALT): 43 U/L (ref 12–78)
Total Protein: 7.6 g/dL (ref 6.4–8.2)

## 2013-06-21 LAB — CLOSTRIDIUM DIFFICILE(ARMC)

## 2013-06-21 LAB — CBC WITH DIFFERENTIAL/PLATELET
Basophil #: 0.2 10*3/uL — ABNORMAL HIGH (ref 0.0–0.1)
Eosinophil #: 0.2 10*3/uL (ref 0.0–0.7)
Eosinophil %: 0.6 %
Lymphocyte #: 2.7 10*3/uL (ref 1.0–3.6)
Lymphocyte %: 9.7 %
MCH: 23.2 pg — ABNORMAL LOW (ref 26.0–34.0)
MCHC: 31.5 g/dL — ABNORMAL LOW (ref 32.0–36.0)
Neutrophil %: 84.8 %
Platelet: 355 10*3/uL (ref 150–440)
WBC: 28 10*3/uL — ABNORMAL HIGH (ref 3.8–10.6)

## 2013-06-21 LAB — BASIC METABOLIC PANEL
Anion Gap: 3 — ABNORMAL LOW (ref 7–16)
Calcium, Total: 8.8 mg/dL (ref 8.5–10.1)
Chloride: 118 mmol/L — ABNORMAL HIGH (ref 98–107)
Co2: 29 mmol/L (ref 21–32)
Creatinine: 3.23 mg/dL — ABNORMAL HIGH (ref 0.60–1.30)
EGFR (Non-African Amer.): 20 — ABNORMAL LOW
Glucose: 308 mg/dL — ABNORMAL HIGH (ref 65–99)
Osmolality: 325 (ref 275–301)

## 2013-06-22 LAB — SODIUM: Sodium: 155 mmol/L — ABNORMAL HIGH (ref 136–145)

## 2013-06-22 LAB — CBC WITH DIFFERENTIAL/PLATELET
Basophil #: 0.2 10*3/uL — ABNORMAL HIGH (ref 0.0–0.1)
Eosinophil #: 0.5 10*3/uL (ref 0.0–0.7)
Eosinophil %: 2.1 %
Eosinophil: 2 %
HCT: 30.6 % — ABNORMAL LOW (ref 40.0–52.0)
Lymphocyte #: 2.6 10*3/uL (ref 1.0–3.6)
Lymphocyte %: 10.8 %
Lymphocytes: 25 %
MCH: 23 pg — ABNORMAL LOW (ref 26.0–34.0)
MCV: 75 fL — ABNORMAL LOW (ref 80–100)
Monocyte #: 1.4 x10 3/mm — ABNORMAL HIGH (ref 0.2–1.0)
Monocytes: 9 %
Neutrophil #: 19.4 10*3/uL — ABNORMAL HIGH (ref 1.4–6.5)
Platelet: 336 10*3/uL (ref 150–440)

## 2013-06-22 LAB — HEPATIC FUNCTION PANEL A (ARMC)
Albumin: 1.8 g/dL — ABNORMAL LOW (ref 3.4–5.0)
Bilirubin,Total: 0.4 mg/dL (ref 0.2–1.0)
SGOT(AST): 22 U/L (ref 15–37)
SGPT (ALT): 20 U/L (ref 12–78)
Total Protein: 5.8 g/dL — ABNORMAL LOW (ref 6.4–8.2)

## 2013-06-22 LAB — URINALYSIS, COMPLETE
Bilirubin,UR: NEGATIVE
Ketone: NEGATIVE
Nitrite: NEGATIVE
Ph: 7 (ref 4.5–8.0)
Protein: >=500
RBC,UR: 39 /HPF (ref 0–5)
WBC UR: 12 /HPF (ref 0–5)

## 2013-06-22 LAB — PROTIME-INR
INR: 1.7
INR: 1.8
Prothrombin Time: 19.5 secs — ABNORMAL HIGH (ref 11.5–14.7)

## 2013-06-22 LAB — BASIC METABOLIC PANEL
Calcium, Total: 8.6 mg/dL (ref 8.5–10.1)
Glucose: 291 mg/dL — ABNORMAL HIGH (ref 65–99)
Sodium: 155 mmol/L — ABNORMAL HIGH (ref 136–145)

## 2013-06-22 LAB — AMMONIA: Ammonia, Plasma: <25 mcmol/L (ref 11–32)

## 2013-06-23 LAB — CBC WITH DIFFERENTIAL/PLATELET
Basophil %: 1 %
Eosinophil #: 0.6 10*3/uL (ref 0.0–0.7)
Eosinophil %: 2.6 %
HCT: 32.3 % — ABNORMAL LOW (ref 40.0–52.0)
Lymphocyte #: 3.6 10*3/uL (ref 1.0–3.6)
MCHC: 31.2 g/dL — ABNORMAL LOW (ref 32.0–36.0)
Monocyte #: 1.5 x10 3/mm — ABNORMAL HIGH (ref 0.2–1.0)
Monocyte %: 6.7 %
Neutrophil #: 16 10*3/uL — ABNORMAL HIGH (ref 1.4–6.5)
Neutrophil %: 73.1 %
Platelet: 352 10*3/uL (ref 150–440)
RBC: 4.32 10*6/uL — ABNORMAL LOW (ref 4.40–5.90)
RDW: 16.4 % — ABNORMAL HIGH (ref 11.5–14.5)
WBC: 21.8 10*3/uL — ABNORMAL HIGH (ref 3.8–10.6)

## 2013-06-23 LAB — COMPREHENSIVE METABOLIC PANEL
Albumin: 1.9 g/dL — ABNORMAL LOW (ref 3.4–5.0)
Alkaline Phosphatase: 215 U/L — ABNORMAL HIGH (ref 50–136)
Anion Gap: 5 — ABNORMAL LOW (ref 7–16)
BUN: 51 mg/dL — ABNORMAL HIGH (ref 7–18)
Calcium, Total: 8.6 mg/dL (ref 8.5–10.1)
Chloride: 122 mmol/L — ABNORMAL HIGH (ref 98–107)
Co2: 29 mmol/L (ref 21–32)
Creatinine: 3.71 mg/dL — ABNORMAL HIGH (ref 0.60–1.30)
EGFR (Non-African Amer.): 17 — ABNORMAL LOW
Osmolality: 327 (ref 275–301)
Potassium: 4.4 mmol/L (ref 3.5–5.1)
SGOT(AST): 28 U/L (ref 15–37)
SGPT (ALT): 20 U/L (ref 12–78)
Sodium: 156 mmol/L — ABNORMAL HIGH (ref 136–145)

## 2013-06-23 LAB — MAGNESIUM: Magnesium: 2.2 mg/dL

## 2013-06-23 LAB — PROTIME-INR
INR: 1.5
Prothrombin Time: 18.4 secs — ABNORMAL HIGH (ref 11.5–14.7)

## 2013-06-24 LAB — COMPREHENSIVE METABOLIC PANEL
Alkaline Phosphatase: 187 U/L — ABNORMAL HIGH (ref 50–136)
Anion Gap: 3 — ABNORMAL LOW (ref 7–16)
Calcium, Total: 8.1 mg/dL — ABNORMAL LOW (ref 8.5–10.1)
Chloride: 114 mmol/L — ABNORMAL HIGH (ref 98–107)
Co2: 31 mmol/L (ref 21–32)
EGFR (African American): 20 — ABNORMAL LOW
EGFR (Non-African Amer.): 18 — ABNORMAL LOW
Glucose: 229 mg/dL — ABNORMAL HIGH (ref 65–99)
Osmolality: 312 (ref 275–301)
Potassium: 4.2 mmol/L (ref 3.5–5.1)
SGOT(AST): 19 U/L (ref 15–37)
SGPT (ALT): 16 U/L (ref 12–78)
Total Protein: 6 g/dL — ABNORMAL LOW (ref 6.4–8.2)

## 2013-06-24 LAB — MAGNESIUM: Magnesium: 1.9 mg/dL

## 2013-06-24 LAB — CBC WITH DIFFERENTIAL/PLATELET
Basophil #: 0.2 10*3/uL — ABNORMAL HIGH (ref 0.0–0.1)
Basophil %: 1.7 %
Eosinophil #: 0.9 10*3/uL — ABNORMAL HIGH (ref 0.0–0.7)
HGB: 9.6 g/dL — ABNORMAL LOW (ref 13.0–18.0)
Lymphocyte #: 2.8 10*3/uL (ref 1.0–3.6)
Lymphocyte %: 20.3 %
MCH: 23.1 pg — ABNORMAL LOW (ref 26.0–34.0)
Monocyte %: 8.3 %
Neutrophil #: 8.6 10*3/uL — ABNORMAL HIGH (ref 1.4–6.5)
Neutrophil %: 63.1 %
Platelet: 326 10*3/uL (ref 150–440)
RDW: 16.1 % — ABNORMAL HIGH (ref 11.5–14.5)
WBC: 13.7 10*3/uL — ABNORMAL HIGH (ref 3.8–10.6)

## 2013-06-24 LAB — CLOSTRIDIUM DIFFICILE(ARMC)

## 2013-06-25 ENCOUNTER — Inpatient Hospital Stay
Admission: AD | Admit: 2013-06-25 | Discharge: 2013-08-11 | Disposition: A | Discharge status: Skilled Nursing Facility | Payer: Medicare Other | Source: Ambulatory Visit | Attending: Internal Medicine | Admitting: Internal Medicine

## 2013-06-25 ENCOUNTER — Other Ambulatory Visit (HOSPITAL_COMMUNITY): Payer: Medicare Other

## 2013-06-25 DIAGNOSIS — J9600 Acute respiratory failure, unspecified whether with hypoxia or hypercapnia: Secondary | ICD-10-CM

## 2013-06-25 DIAGNOSIS — J690 Pneumonitis due to inhalation of food and vomit: Secondary | ICD-10-CM

## 2013-06-25 LAB — CBC WITH DIFFERENTIAL/PLATELET
Eosinophil #: 1 10*3/uL — ABNORMAL HIGH (ref 0.0–0.7)
Eosinophil %: 9.8 %
Lymphocyte #: 2.6 10*3/uL (ref 1.0–3.6)
MCHC: 32.1 g/dL (ref 32.0–36.0)
MCV: 74 fL — ABNORMAL LOW (ref 80–100)
Neutrophil #: 5.9 10*3/uL (ref 1.4–6.5)
Neutrophil %: 56.9 %
Platelet: 274 10*3/uL (ref 150–440)
RBC: 3.98 10*6/uL — ABNORMAL LOW (ref 4.40–5.90)
RDW: 16.1 % — ABNORMAL HIGH (ref 11.5–14.5)
WBC: 10.4 10*3/uL (ref 3.8–10.6)

## 2013-06-25 LAB — CULTURE, BLOOD (SINGLE)

## 2013-06-25 LAB — BASIC METABOLIC PANEL
BUN: 43 mg/dL — ABNORMAL HIGH (ref 7–18)
Calcium, Total: 7.7 mg/dL — ABNORMAL LOW (ref 8.5–10.1)
Chloride: 109 mmol/L — ABNORMAL HIGH (ref 98–107)
Creatinine: 3.61 mg/dL — ABNORMAL HIGH (ref 0.60–1.30)
Glucose: 216 mg/dL — ABNORMAL HIGH (ref 65–99)
Osmolality: 299 (ref 275–301)
Sodium: 141 mmol/L (ref 136–145)

## 2013-06-25 MED ORDER — IOHEXOL 300 MG/ML  SOLN
30.0000 mL | Freq: Once | INTRAMUSCULAR | Status: AC | PRN
  Administered 2013-06-25: 30 mL

## 2013-06-26 LAB — CBC
HCT: 30.7 % — ABNORMAL LOW (ref 39.0–52.0)
MCHC: 31.9 g/dL (ref 30.0–36.0)
MCV: 73.6 fL — ABNORMAL LOW (ref 78.0–100.0)
Platelets: 312 10*3/uL (ref 150–400)
RBC: 4.17 MIL/uL — ABNORMAL LOW (ref 4.22–5.81)
RDW: 15.5 % (ref 11.5–15.5)
WBC: 10.8 10*3/uL — ABNORMAL HIGH (ref 4.0–10.5)

## 2013-06-26 LAB — COMPREHENSIVE METABOLIC PANEL
Albumin: 1.8 g/dL — ABNORMAL LOW (ref 3.5–5.2)
BUN: 42 mg/dL — ABNORMAL HIGH (ref 6–23)
Calcium: 7.8 mg/dL — ABNORMAL LOW (ref 8.4–10.5)
Chloride: 103 mEq/L (ref 96–112)
Creatinine, Ser: 3.52 mg/dL — ABNORMAL HIGH (ref 0.50–1.35)
GFR calc Af Amer: 21 mL/min — ABNORMAL LOW (ref >90)
Potassium: 3.7 mEq/L (ref 3.5–5.1)
Total Bilirubin: 0.2 mg/dL — ABNORMAL LOW (ref 0.3–1.2)
Total Protein: 5.8 g/dL — ABNORMAL LOW (ref 6.0–8.3)

## 2013-06-26 LAB — PROTIME-INR
INR: 1.29 (ref 0.00–1.49)
Prothrombin Time: 15.8 seconds — ABNORMAL HIGH (ref 11.6–15.2)

## 2013-06-26 LAB — CALCIUM, IONIZED: Calcium, Ion: 1.09 mmol/L — ABNORMAL LOW (ref 1.12–1.23)

## 2013-06-26 LAB — PHOSPHORUS: Phosphorus: 3.7 mg/dL (ref 2.3–4.6)

## 2013-06-27 LAB — ALBUMIN: Albumin: 1.6 g/dL — ABNORMAL LOW (ref 3.5–5.2)

## 2013-06-27 LAB — CULTURE, BLOOD (SINGLE)

## 2013-06-27 LAB — BASIC METABOLIC PANEL
CO2: 22 mEq/L (ref 19–32)
Calcium: 7.9 mg/dL — ABNORMAL LOW (ref 8.4–10.5)
Creatinine, Ser: 3.89 mg/dL — ABNORMAL HIGH (ref 0.50–1.35)
GFR calc Af Amer: 19 mL/min — ABNORMAL LOW (ref >90)

## 2013-06-27 LAB — MAGNESIUM: Magnesium: 2.3 mg/dL (ref 1.5–2.5)

## 2013-06-28 LAB — BASIC METABOLIC PANEL
BUN: 44 mg/dL — ABNORMAL HIGH (ref 6–23)
CO2: 23 mEq/L (ref 19–32)
Chloride: 105 mEq/L (ref 96–112)
GFR calc Af Amer: 19 mL/min — ABNORMAL LOW (ref >90)
GFR calc non Af Amer: 17 mL/min — ABNORMAL LOW (ref >90)
Potassium: 3.6 mEq/L (ref 3.5–5.1)
Sodium: 139 mEq/L (ref 135–145)

## 2013-06-29 LAB — CBC
HCT: 29.3 % — ABNORMAL LOW (ref 39.0–52.0)
Hemoglobin: 9.3 g/dL — ABNORMAL LOW (ref 13.0–17.0)
MCHC: 31.7 g/dL (ref 30.0–36.0)
RBC: 4 MIL/uL — ABNORMAL LOW (ref 4.22–5.81)
WBC: 12.7 10*3/uL — ABNORMAL HIGH (ref 4.0–10.5)

## 2013-06-29 LAB — HEMOGLOBIN A1C
Hgb A1c MFr Bld: 8.7 % — ABNORMAL HIGH (ref <5.7)
Mean Plasma Glucose: 203 mg/dL — ABNORMAL HIGH (ref <117)

## 2013-07-01 LAB — CBC
HCT: 28.3 % — ABNORMAL LOW (ref 39.0–52.0)
Hemoglobin: 9.2 g/dL — ABNORMAL LOW (ref 13.0–17.0)
MCHC: 32.5 g/dL (ref 30.0–36.0)
RBC: 3.92 MIL/uL — ABNORMAL LOW (ref 4.22–5.81)
WBC: 14.3 10*3/uL — ABNORMAL HIGH (ref 4.0–10.5)

## 2013-07-01 LAB — BASIC METABOLIC PANEL
BUN: 39 mg/dL — ABNORMAL HIGH (ref 6–23)
Calcium: 8.7 mg/dL (ref 8.4–10.5)
Chloride: 108 mEq/L (ref 96–112)
GFR calc non Af Amer: 23 mL/min — ABNORMAL LOW (ref >90)
Glucose, Bld: 49 mg/dL — ABNORMAL LOW (ref 70–99)
Potassium: 3.3 mEq/L — ABNORMAL LOW (ref 3.5–5.1)
Sodium: 144 mEq/L (ref 135–145)

## 2013-07-03 LAB — BASIC METABOLIC PANEL
BUN: 35 mg/dL — ABNORMAL HIGH (ref 6–23)
CO2: 26 mEq/L (ref 19–32)
Calcium: 8.7 mg/dL (ref 8.4–10.5)
GFR calc non Af Amer: 35 mL/min — ABNORMAL LOW (ref >90)
Glucose, Bld: 118 mg/dL — ABNORMAL HIGH (ref 70–99)
Potassium: 4.8 mEq/L (ref 3.5–5.1)
Sodium: 142 mEq/L (ref 135–145)

## 2013-07-03 LAB — CBC
HCT: 28.9 % — ABNORMAL LOW (ref 39.0–52.0)
Hemoglobin: 9.2 g/dL — ABNORMAL LOW (ref 13.0–17.0)
MCH: 23.5 pg — ABNORMAL LOW (ref 26.0–34.0)
MCHC: 31.8 g/dL (ref 30.0–36.0)
Platelets: 477 10*3/uL — ABNORMAL HIGH (ref 150–400)
RBC: 3.91 MIL/uL — ABNORMAL LOW (ref 4.22–5.81)

## 2013-07-06 LAB — BASIC METABOLIC PANEL
CO2: 27 mEq/L (ref 19–32)
Calcium: 8.7 mg/dL (ref 8.4–10.5)
Creatinine, Ser: 1.93 mg/dL — ABNORMAL HIGH (ref 0.50–1.35)
GFR calc non Af Amer: 37 mL/min — ABNORMAL LOW (ref >90)
Glucose, Bld: 161 mg/dL — ABNORMAL HIGH (ref 70–99)

## 2013-07-06 LAB — CBC
Hemoglobin: 9.3 g/dL — ABNORMAL LOW (ref 13.0–17.0)
MCH: 24 pg — ABNORMAL LOW (ref 26.0–34.0)
MCHC: 33 g/dL (ref 30.0–36.0)
MCV: 72.9 fL — ABNORMAL LOW (ref 78.0–100.0)
RBC: 3.87 MIL/uL — ABNORMAL LOW (ref 4.22–5.81)

## 2013-07-09 LAB — CBC
Hemoglobin: 9.1 g/dL — ABNORMAL LOW (ref 13.0–17.0)
MCH: 23.6 pg — ABNORMAL LOW (ref 26.0–34.0)
MCHC: 31.6 g/dL (ref 30.0–36.0)
Platelets: 406 10*3/uL — ABNORMAL HIGH (ref 150–400)
RDW: 16.8 % — ABNORMAL HIGH (ref 11.5–15.5)

## 2013-07-09 LAB — BASIC METABOLIC PANEL
BUN: 40 mg/dL — ABNORMAL HIGH (ref 6–23)
CO2: 28 mEq/L (ref 19–32)
Calcium: 8.8 mg/dL (ref 8.4–10.5)
Creatinine, Ser: 1.89 mg/dL — ABNORMAL HIGH (ref 0.50–1.35)
GFR calc Af Amer: 44 mL/min — ABNORMAL LOW (ref >90)
GFR calc non Af Amer: 38 mL/min — ABNORMAL LOW (ref >90)
Glucose, Bld: 296 mg/dL — ABNORMAL HIGH (ref 70–99)
Potassium: 4.5 mEq/L (ref 3.5–5.1)

## 2013-07-11 LAB — CBC
HCT: 28.7 % — ABNORMAL LOW (ref 39.0–52.0)
MCV: 74 fL — ABNORMAL LOW (ref 78.0–100.0)
Platelets: 378 10*3/uL (ref 150–400)
RDW: 17 % — ABNORMAL HIGH (ref 11.5–15.5)
WBC: 10.3 10*3/uL (ref 4.0–10.5)

## 2013-07-11 LAB — BASIC METABOLIC PANEL
Chloride: 103 mEq/L (ref 96–112)
Creatinine, Ser: 1.84 mg/dL — ABNORMAL HIGH (ref 0.50–1.35)
GFR calc Af Amer: 46 mL/min — ABNORMAL LOW (ref >90)
Potassium: 4 mEq/L (ref 3.5–5.1)
Sodium: 140 mEq/L (ref 135–145)

## 2013-07-11 LAB — LIPID PANEL
Cholesterol: 125 mg/dL (ref 0–200)
HDL: 28 mg/dL — ABNORMAL LOW (ref >39)
Total CHOL/HDL Ratio: 4.5 RATIO
Triglycerides: 127 mg/dL (ref <150)

## 2013-07-14 ENCOUNTER — Other Ambulatory Visit (HOSPITAL_COMMUNITY): Payer: Self-pay

## 2013-07-14 ENCOUNTER — Other Ambulatory Visit (HOSPITAL_COMMUNITY): Payer: Medicare Other

## 2013-07-14 ENCOUNTER — Ambulatory Visit: Payer: Self-pay | Admitting: Internal Medicine

## 2013-07-14 LAB — URINALYSIS, ROUTINE W REFLEX MICROSCOPIC
Ketones, ur: NEGATIVE mg/dL
Nitrite: NEGATIVE
Specific Gravity, Urine: 1.018 (ref 1.005–1.030)
pH: 6 (ref 5.0–8.0)

## 2013-07-14 LAB — CBC WITH DIFFERENTIAL/PLATELET
Eosinophils Absolute: 0.3 10*3/uL (ref 0.0–0.7)
Eosinophils Relative: 2 % (ref 0–5)
HCT: 32.3 % — ABNORMAL LOW (ref 39.0–52.0)
Hemoglobin: 10.4 g/dL — ABNORMAL LOW (ref 13.0–17.0)
Lymphs Abs: 3.1 10*3/uL (ref 0.7–4.0)
MCH: 23.8 pg — ABNORMAL LOW (ref 26.0–34.0)
MCV: 73.9 fL — ABNORMAL LOW (ref 78.0–100.0)
Monocytes Relative: 8 % (ref 3–12)
Platelets: 361 10*3/uL (ref 150–400)
RBC: 4.37 MIL/uL (ref 4.22–5.81)
WBC: 19.2 10*3/uL — ABNORMAL HIGH (ref 4.0–10.5)

## 2013-07-14 LAB — PREALBUMIN: Prealbumin: 16.3 mg/dL — ABNORMAL LOW (ref 17.0–34.0)

## 2013-07-14 LAB — COMPREHENSIVE METABOLIC PANEL
AST: 26 U/L (ref 0–37)
BUN: 48 mg/dL — ABNORMAL HIGH (ref 6–23)
CO2: 28 mEq/L (ref 19–32)
Calcium: 8.5 mg/dL (ref 8.4–10.5)
Chloride: 101 mEq/L (ref 96–112)
GFR calc Af Amer: 41 mL/min — ABNORMAL LOW (ref >90)
Glucose, Bld: 175 mg/dL — ABNORMAL HIGH (ref 70–99)
Total Bilirubin: 0.3 mg/dL (ref 0.3–1.2)
Total Protein: 7.2 g/dL (ref 6.0–8.3)

## 2013-07-14 LAB — URINE MICROSCOPIC-ADD ON

## 2013-07-15 LAB — CBC WITH DIFFERENTIAL/PLATELET
Basophils Relative: 1 % (ref 0–1)
Eosinophils Absolute: 1.1 10*3/uL — ABNORMAL HIGH (ref 0.0–0.7)
Hemoglobin: 9.1 g/dL — ABNORMAL LOW (ref 13.0–17.0)
Lymphocytes Relative: 13 % (ref 12–46)
MCHC: 31.6 g/dL (ref 30.0–36.0)
Monocytes Relative: 9 % (ref 3–12)
Neutro Abs: 13.5 10*3/uL — ABNORMAL HIGH (ref 1.7–7.7)
Neutrophils Relative %: 71 % (ref 43–77)
RBC: 3.83 MIL/uL — ABNORMAL LOW (ref 4.22–5.81)
WBC: 19 10*3/uL — ABNORMAL HIGH (ref 4.0–10.5)

## 2013-07-15 LAB — BASIC METABOLIC PANEL
BUN: 50 mg/dL — ABNORMAL HIGH (ref 6–23)
CO2: 28 mEq/L (ref 19–32)
Chloride: 102 mEq/L (ref 96–112)
GFR calc Af Amer: 34 mL/min — ABNORMAL LOW (ref >90)
Potassium: 4.4 mEq/L (ref 3.5–5.1)

## 2013-07-15 LAB — URINE CULTURE
Colony Count: NO GROWTH
Culture: NO GROWTH

## 2013-07-16 LAB — CBC WITH DIFFERENTIAL/PLATELET
HCT: 28.5 % — ABNORMAL LOW (ref 39.0–52.0)
Hemoglobin: 9 g/dL — ABNORMAL LOW (ref 13.0–17.0)
Lymphocytes Relative: 17 % (ref 12–46)
MCHC: 31.6 g/dL (ref 30.0–36.0)
Monocytes Absolute: 2 10*3/uL — ABNORMAL HIGH (ref 0.1–1.0)
Monocytes Relative: 13 % — ABNORMAL HIGH (ref 3–12)
Neutro Abs: 9.3 10*3/uL — ABNORMAL HIGH (ref 1.7–7.7)
RBC: 3.8 MIL/uL — ABNORMAL LOW (ref 4.22–5.81)
WBC: 14.9 10*3/uL — ABNORMAL HIGH (ref 4.0–10.5)

## 2013-07-16 LAB — CULTURE, RESPIRATORY W GRAM STAIN

## 2013-07-16 LAB — BASIC METABOLIC PANEL
BUN: 46 mg/dL — ABNORMAL HIGH (ref 6–23)
CO2: 27 mEq/L (ref 19–32)
Chloride: 107 mEq/L (ref 96–112)
Creatinine, Ser: 2.33 mg/dL — ABNORMAL HIGH (ref 0.50–1.35)
GFR calc non Af Amer: 30 mL/min — ABNORMAL LOW (ref >90)
Glucose, Bld: 97 mg/dL (ref 70–99)

## 2013-07-17 LAB — CBC WITH DIFFERENTIAL/PLATELET
Basophils Absolute: 0.1 10*3/uL (ref 0.0–0.1)
Basophils Relative: 1 % (ref 0–1)
Eosinophils Absolute: 1.2 10*3/uL — ABNORMAL HIGH (ref 0.0–0.7)
Eosinophils Relative: 10 % — ABNORMAL HIGH (ref 0–5)
HCT: 29.9 % — ABNORMAL LOW (ref 39.0–52.0)
Lymphocytes Relative: 23 % (ref 12–46)
MCH: 23.3 pg — ABNORMAL LOW (ref 26.0–34.0)
MCHC: 30.8 g/dL (ref 30.0–36.0)
MCV: 75.7 fL — ABNORMAL LOW (ref 78.0–100.0)
Monocytes Absolute: 1.5 10*3/uL — ABNORMAL HIGH (ref 0.1–1.0)
Neutrophils Relative %: 53 % (ref 43–77)
Platelets: 309 10*3/uL (ref 150–400)
RDW: 16.1 % — ABNORMAL HIGH (ref 11.5–15.5)

## 2013-07-17 LAB — BASIC METABOLIC PANEL
CO2: 27 mEq/L (ref 19–32)
Calcium: 8.5 mg/dL (ref 8.4–10.5)
Chloride: 104 mEq/L (ref 96–112)
Creatinine, Ser: 2.17 mg/dL — ABNORMAL HIGH (ref 0.50–1.35)
GFR calc non Af Amer: 32 mL/min — ABNORMAL LOW (ref >90)

## 2013-07-18 LAB — CBC
MCHC: 31.3 g/dL (ref 30.0–36.0)
MCV: 74.7 fL — ABNORMAL LOW (ref 78.0–100.0)
Platelets: 352 10*3/uL (ref 150–400)
RBC: 4.19 MIL/uL — ABNORMAL LOW (ref 4.22–5.81)
RDW: 16 % — ABNORMAL HIGH (ref 11.5–15.5)
WBC: 11.6 10*3/uL — ABNORMAL HIGH (ref 4.0–10.5)

## 2013-07-18 LAB — BASIC METABOLIC PANEL
BUN: 45 mg/dL — ABNORMAL HIGH (ref 6–23)
CO2: 30 mEq/L (ref 19–32)
Calcium: 8.5 mg/dL (ref 8.4–10.5)
Chloride: 105 mEq/L (ref 96–112)
Creatinine, Ser: 2.2 mg/dL — ABNORMAL HIGH (ref 0.50–1.35)
GFR calc Af Amer: 37 mL/min — ABNORMAL LOW (ref >90)
GFR calc non Af Amer: 32 mL/min — ABNORMAL LOW (ref >90)

## 2013-07-18 LAB — VANCOMYCIN, TROUGH: Vancomycin Tr: 29.3 ug/mL (ref 10.0–20.0)

## 2013-07-19 ENCOUNTER — Other Ambulatory Visit (HOSPITAL_COMMUNITY): Payer: Medicare Other

## 2013-07-19 LAB — BASIC METABOLIC PANEL
BUN: 41 mg/dL — ABNORMAL HIGH (ref 6–23)
CO2: 27 mEq/L (ref 19–32)
Calcium: 8.7 mg/dL (ref 8.4–10.5)
Chloride: 101 mEq/L (ref 96–112)
Creatinine, Ser: 2.27 mg/dL — ABNORMAL HIGH (ref 0.50–1.35)
Glucose, Bld: 130 mg/dL — ABNORMAL HIGH (ref 70–99)
Sodium: 139 mEq/L (ref 135–145)

## 2013-07-19 LAB — CBC
Hemoglobin: 10.6 g/dL — ABNORMAL LOW (ref 13.0–17.0)
MCH: 23.5 pg — ABNORMAL LOW (ref 26.0–34.0)
MCHC: 31.6 g/dL (ref 30.0–36.0)
MCV: 74.3 fL — ABNORMAL LOW (ref 78.0–100.0)
Platelets: 348 10*3/uL (ref 150–400)
RDW: 15.8 % — ABNORMAL HIGH (ref 11.5–15.5)

## 2013-07-20 LAB — CULTURE, BLOOD (ROUTINE X 2)
Culture: NO GROWTH
Culture: NO GROWTH

## 2013-07-21 LAB — CBC
HCT: 28.5 % — ABNORMAL LOW (ref 39.0–52.0)
Hemoglobin: 8.9 g/dL — ABNORMAL LOW (ref 13.0–17.0)
MCH: 22.9 pg — ABNORMAL LOW (ref 26.0–34.0)
MCHC: 31.2 g/dL (ref 30.0–36.0)
MCV: 73.3 fL — ABNORMAL LOW (ref 78.0–100.0)
RBC: 3.89 MIL/uL — ABNORMAL LOW (ref 4.22–5.81)

## 2013-07-21 LAB — BASIC METABOLIC PANEL
BUN: 30 mg/dL — ABNORMAL HIGH (ref 6–23)
CO2: 25 mEq/L (ref 19–32)
Creatinine, Ser: 2.12 mg/dL — ABNORMAL HIGH (ref 0.50–1.35)
GFR calc non Af Amer: 33 mL/min — ABNORMAL LOW (ref >90)
Glucose, Bld: 234 mg/dL — ABNORMAL HIGH (ref 70–99)
Potassium: 4.3 mEq/L (ref 3.5–5.1)

## 2013-07-23 LAB — BASIC METABOLIC PANEL
BUN: 25 mg/dL — ABNORMAL HIGH (ref 6–23)
Calcium: 8.4 mg/dL (ref 8.4–10.5)
GFR calc non Af Amer: 35 mL/min — ABNORMAL LOW (ref >90)
Glucose, Bld: 194 mg/dL — ABNORMAL HIGH (ref 70–99)
Sodium: 141 mEq/L (ref 135–145)

## 2013-07-23 LAB — CBC
HCT: 30.3 % — ABNORMAL LOW (ref 39.0–52.0)
Hemoglobin: 9.6 g/dL — ABNORMAL LOW (ref 13.0–17.0)
MCH: 23.2 pg — ABNORMAL LOW (ref 26.0–34.0)
MCHC: 31.7 g/dL (ref 30.0–36.0)
RDW: 15.7 % — ABNORMAL HIGH (ref 11.5–15.5)

## 2013-07-24 ENCOUNTER — Other Ambulatory Visit (HOSPITAL_COMMUNITY): Payer: Medicare Other

## 2013-07-24 LAB — CBC
Hemoglobin: 10.3 g/dL — ABNORMAL LOW (ref 13.0–17.0)
MCH: 23.9 pg — ABNORMAL LOW (ref 26.0–34.0)
MCHC: 32.1 g/dL (ref 30.0–36.0)
Platelets: 496 10*3/uL — ABNORMAL HIGH (ref 150–400)
RBC: 4.31 MIL/uL (ref 4.22–5.81)

## 2013-07-24 NOTE — H&P (Signed)
Referring Physician: [facility] HPI: Robert [name] is an 57 y.o. male who was transferred with [facility] after acute respiratory failure and acute on chronic renal failure. The patient has been treated for aspiration pneumonia with ongoing aspiration. The patient had a recent CVA with subsequent dysphagia. Per the sister, the patient had the percutaneous gastric tube placed the admission of 06/05/13 at [facility], no documents of this procedure are found in the chart. IR received request for image guided exchange of gastric tube to gastric jejunostomy tube for aspiration risk.    Past Medical History: CKD, PVD, HTN, DM, CVA, Neuropathy, Anemia of chronic disease  Past Surgical History: Percutaneous gastric tube 10/23 admission at [facility]  Family History: No family history on file.  Social History:  has no tobacco, alcohol, and drug history on file.  Allergies: Reglan  Medications:   Medication List    Notice   You have not been prescribed any medications.     Please HPI for pertinent positives, otherwise complete 10 system ROS negative.  Physical Exam: Temp: 20F, HR: 76bpm, BP: 130/59mmHg, O2 93 RA  General Appearance:  Alert, cooperative, no distress  Head:  Normocephalic, without obvious abnormality, atraumatic  ENT: Unremarkable  Neck: Supple, symmetrical, trachea midline  Lungs:   Rales bilaterally, respirations unlabored without use of accessory muscles.  Chest Wall:  No tenderness or deformity  Heart:  Regular rate and rhythm, S1, S2 normal, no murmur, rub or gallop.  Abdomen:   Soft, non-tender, non distended, (+) BS, percutaneous gastric tube intact.  Extremities: Left BKA, atraumatic, no cyanosis or edema   Results for orders placed during the hospital encounter of 06/25/13 (from the past 48 hour(s))  CBC     Status: Abnormal   Collection Time    07/23/13  6:30 AM      Result Value Range   WBC 14.3 (*) 4.0 - 10.5 K/uL   RBC 4.13 (*) 4.22 - 5.81 MIL/uL   Hemoglobin 9.6 (*) 13.0  - 17.0 g/dL   HCT 16.1 (*) 09.6 - 04.5 %   MCV 73.4 (*) 78.0 - 100.0 fL   MCH 23.2 (*) 26.0 - 34.0 pg   MCHC 31.7  30.0 - 36.0 g/dL   RDW 40.9 (*) 81.1 - 91.4 %   Platelets 401 (*) 150 - 400 K/uL  BASIC METABOLIC PANEL     Status: Abnormal   Collection Time    07/23/13  6:30 AM      Result Value Range   Sodium 141  135 - 145 mEq/L   Potassium 4.6  3.5 - 5.1 mEq/L   Chloride 105  96 - 112 mEq/L   CO2 26  19 - 32 mEq/L   Glucose, Bld 194 (*) 70 - 99 mg/dL   BUN 25 (*) 6 - 23 mg/dL   Creatinine, Ser 7.82 (*) 0.50 - 1.35 mg/dL   Calcium 8.4  8.4 - 95.6 mg/dL   GFR calc non Af Amer 35 (*) >90 mL/min   GFR calc Af Amer 41 (*) >90 mL/min   Comment: (NOTE)     The eGFR has been calculated using the CKD EPI equation.     This calculation has not been validated in all clinical situations.     eGFR's persistently <90 mL/min signify possible Chronic Kidney     Disease.   Dg Chest Port 1 View  07/24/2013   CLINICAL DATA:  Question of aspiration pneumonia.  EXAM: PORTABLE CHEST - 1 VIEW  COMPARISON:  Chest  radiograph performed 07/19/2013  FINDINGS: The lungs are well-aerated. Worsening bilateral airspace opacification is concerning for worsening multifocal pneumonia. The appearance is less typical for aspiration. No pleural effusion or pneumothorax is seen.  The cardiomediastinal silhouette is within normal limits. No acute osseous abnormalities are seen. A left subclavian line is noted ending about the proximal SVC.  IMPRESSION: Worsening bilateral airspace opacification, concerning for worsening multifocal pneumonia. The appearance is less typical for aspiration.   Electronically Signed   By: Roanna Raider M.D.   On: 07/24/2013 06:29    Assessment/Plan CVA s/p Percutaneous gastric tube placed at [place] admission of 06/05/13 for dysphagia DM Neuropathy Acute on CKD Aspiration pneumonia PVD s/p Left BKA HTN Request for image guided exchange of gastric tube to gastric jejunostomy  tube. Tube feeds held after midnight, patient is not on blood thinners. Risks and Benefits discussed with the patient's sister over the phone. All questions were answered, Patient's sister is agreeable to proceed. Consent signed and in IR.   Pattricia Boss D PA-C 07/24/2013, 4:41 PM

## 2013-07-25 ENCOUNTER — Other Ambulatory Visit (HOSPITAL_COMMUNITY): Payer: Self-pay

## 2013-07-25 ENCOUNTER — Other Ambulatory Visit (HOSPITAL_COMMUNITY): Payer: Medicare Other

## 2013-07-25 LAB — BLOOD GAS, ARTERIAL
Acid-Base Excess: 1.6 mmol/L (ref 0.0–2.0)
Acid-base deficit: 1.1 mmol/L (ref 0.0–2.0)
Bicarbonate: 26.5 mEq/L — ABNORMAL HIGH (ref 20.0–24.0)
Bicarbonate: 23 mEq/L (ref 20.0–24.0)
Drawn by: 222511
FIO2: 0.5 %
MECHVT: 500 mL
O2 Content: 2 L/min
O2 Saturation: 95 %
O2 Saturation: 99.7 %
PEEP: 5 cmH2O
TCO2: 27.9 mmol/L (ref 0–100)
TCO2: 24.1 mmol/L (ref 0–100)
pCO2 arterial: 49.7 mmHg — ABNORMAL HIGH (ref 35.0–45.0)
pCO2 arterial: 37.7 mmHg (ref 35.0–45.0)
pH, Arterial: 7.35 (ref 7.350–7.450)
pO2, Arterial: 78.3 mmHg — ABNORMAL LOW (ref 80.0–100.0)

## 2013-07-25 LAB — BASIC METABOLIC PANEL
BUN: 29 mg/dL — ABNORMAL HIGH (ref 6–23)
CO2: 28 mEq/L (ref 19–32)
Chloride: 106 mEq/L (ref 96–112)
Creatinine, Ser: 2.72 mg/dL — ABNORMAL HIGH (ref 0.50–1.35)
GFR calc Af Amer: 29 mL/min — ABNORMAL LOW (ref >90)
GFR calc non Af Amer: 25 mL/min — ABNORMAL LOW (ref >90)
Potassium: 3.7 mEq/L (ref 3.5–5.1)

## 2013-07-25 LAB — URINALYSIS, ROUTINE W REFLEX MICROSCOPIC
Nitrite: NEGATIVE
Protein, ur: >300 mg/dL — AB
Specific Gravity, Urine: 1.027 (ref 1.005–1.030)
Urobilinogen, UA: 0.2 mg/dL (ref 0.0–1.0)

## 2013-07-25 LAB — URINE MICROSCOPIC-ADD ON

## 2013-07-25 LAB — CBC
HCT: 30.3 % — ABNORMAL LOW (ref 39.0–52.0)
Platelets: 435 10*3/uL — ABNORMAL HIGH (ref 150–400)
RBC: 4.12 MIL/uL — ABNORMAL LOW (ref 4.22–5.81)
RDW: 16.2 % — ABNORMAL HIGH (ref 11.5–15.5)
WBC: 36 10*3/uL — ABNORMAL HIGH (ref 4.0–10.5)

## 2013-07-26 ENCOUNTER — Other Ambulatory Visit (HOSPITAL_COMMUNITY): Payer: Medicare Other

## 2013-07-26 LAB — CBC WITH DIFFERENTIAL/PLATELET
Basophils Relative: 1 % (ref 0–1)
Eosinophils Relative: 3 % (ref 0–5)
HCT: 26.6 % — ABNORMAL LOW (ref 39.0–52.0)
Hemoglobin: 8.7 g/dL — ABNORMAL LOW (ref 13.0–17.0)
Lymphocytes Relative: 10 % — ABNORMAL LOW (ref 12–46)
Lymphs Abs: 2.5 10*3/uL (ref 0.7–4.0)
MCH: 24 pg — ABNORMAL LOW (ref 26.0–34.0)
MCV: 73.3 fL — ABNORMAL LOW (ref 78.0–100.0)
Monocytes Absolute: 1.7 10*3/uL — ABNORMAL HIGH (ref 0.1–1.0)
Monocytes Relative: 7 % (ref 3–12)
Neutro Abs: 20.3 10*3/uL — ABNORMAL HIGH (ref 1.7–7.7)
RBC: 3.63 MIL/uL — ABNORMAL LOW (ref 4.22–5.81)
RDW: 16.3 % — ABNORMAL HIGH (ref 11.5–15.5)
WBC: 25.6 10*3/uL — ABNORMAL HIGH (ref 4.0–10.5)

## 2013-07-26 LAB — BASIC METABOLIC PANEL
BUN: 31 mg/dL — ABNORMAL HIGH (ref 6–23)
CO2: 23 mEq/L (ref 19–32)
Calcium: 7.8 mg/dL — ABNORMAL LOW (ref 8.4–10.5)
Chloride: 110 mEq/L (ref 96–112)
Creatinine, Ser: 3.35 mg/dL — ABNORMAL HIGH (ref 0.50–1.35)
Glucose, Bld: 191 mg/dL — ABNORMAL HIGH (ref 70–99)
Potassium: 3.5 mEq/L (ref 3.5–5.1)

## 2013-07-26 LAB — URINE CULTURE: Colony Count: NO GROWTH

## 2013-07-26 LAB — PROCALCITONIN: Procalcitonin: 1.18 ng/mL

## 2013-07-27 LAB — CBC
HCT: 24.5 % — ABNORMAL LOW (ref 39.0–52.0)
Hemoglobin: 8 g/dL — ABNORMAL LOW (ref 13.0–17.0)
MCHC: 32.7 g/dL (ref 30.0–36.0)
MCV: 72.7 fL — ABNORMAL LOW (ref 78.0–100.0)
RBC: 3.37 MIL/uL — ABNORMAL LOW (ref 4.22–5.81)
RDW: 16.5 % — ABNORMAL HIGH (ref 11.5–15.5)
WBC: 17.3 10*3/uL — ABNORMAL HIGH (ref 4.0–10.5)

## 2013-07-27 LAB — BLOOD GAS, ARTERIAL
Bicarbonate: 21.8 mEq/L (ref 20.0–24.0)
FIO2: 0.3 %
MECHVT: 500 mL
O2 Saturation: 97.4 %
PEEP: 5 cmH2O
Patient temperature: 98.6
TCO2: 23 mmol/L (ref 0–100)
pO2, Arterial: 92.9 mmHg (ref 80.0–100.0)

## 2013-07-27 LAB — CULTURE, RESPIRATORY W GRAM STAIN

## 2013-07-27 LAB — BASIC METABOLIC PANEL
BUN: 33 mg/dL — ABNORMAL HIGH (ref 6–23)
CO2: 23 mEq/L (ref 19–32)
Chloride: 110 mEq/L (ref 96–112)
Creatinine, Ser: 3.47 mg/dL — ABNORMAL HIGH (ref 0.50–1.35)
GFR calc Af Amer: 21 mL/min — ABNORMAL LOW (ref >90)
Potassium: 3.5 mEq/L (ref 3.5–5.1)
Sodium: 142 mEq/L (ref 135–145)

## 2013-07-27 MED ORDER — CEFAZOLIN SODIUM-DEXTROSE 2-3 GM-% IV SOLR: 2.0000 g | Freq: Once | INTRAVENOUS | Status: DC

## 2013-07-27 NOTE — Progress Notes (Signed)
IR PA aware of request for gastric tube exchange to GJ tube for recurrent aspiration. Patient was previously seen and scheduled for this procedure however his respiratory status declined and the patient was intubated on 07/25/13. The patient is now on a ventilator and [facility] would like to exchange the gastric tube for GJ tube. The patient is on vancomycin and afebrile 98.67F with wbc trending down, will check cbc in am and hold tube feeds after midnight for possible exchange 12/15.  Pattricia Boss PA-C Interventional Radiology  07/27/13  11:10 AM

## 2013-07-28 ENCOUNTER — Other Ambulatory Visit (HOSPITAL_COMMUNITY): Payer: Medicare Other

## 2013-07-28 DIAGNOSIS — M7989 Other specified soft tissue disorders: Secondary | ICD-10-CM

## 2013-07-28 LAB — CBC
MCH: 23.3 pg — ABNORMAL LOW (ref 26.0–34.0)
Platelets: 453 10*3/uL — ABNORMAL HIGH (ref 150–400)
RBC: 3.6 MIL/uL — ABNORMAL LOW (ref 4.22–5.81)
RDW: 16.1 % — ABNORMAL HIGH (ref 11.5–15.5)
WBC: 14.5 10*3/uL — ABNORMAL HIGH (ref 4.0–10.5)

## 2013-07-28 LAB — BASIC METABOLIC PANEL
BUN: 29 mg/dL — ABNORMAL HIGH (ref 6–23)
Calcium: 7.9 mg/dL — ABNORMAL LOW (ref 8.4–10.5)
Chloride: 110 mEq/L (ref 96–112)
GFR calc Af Amer: 25 mL/min — ABNORMAL LOW (ref >90)
GFR calc non Af Amer: 21 mL/min — ABNORMAL LOW (ref >90)
Potassium: 3.4 mEq/L — ABNORMAL LOW (ref 3.5–5.1)
Sodium: 141 mEq/L (ref 135–145)

## 2013-07-28 LAB — PROTIME-INR
INR: 2.14 — ABNORMAL HIGH (ref 0.00–1.49)
Prothrombin Time: 23.2 seconds — ABNORMAL HIGH (ref 11.6–15.2)

## 2013-07-28 MED ORDER — IOHEXOL 300 MG/ML  SOLN
50.0000 mL | Freq: Once | INTRAMUSCULAR | Status: AC | PRN
  Administered 2013-07-28: 10 mL via INTRAVENOUS

## 2013-07-28 NOTE — Progress Notes (Signed)
Left upper extremity venous duplex:  No evidence of DVT or superficial thrombosis.    

## 2013-07-28 NOTE — Procedures (Signed)
Procedure:  Conversion of gastrostomy to gastrojejunostomy feeding catheter Findings:  Pre-existing gastrostomy shortened/cut.  Wire and catheter advanced into duodenum and proximal jejunum.  Coaxial 9 Fr feeding catheter advanced over wire through gastrostomy with tip in proximal jejunum.  OK to use for feeds.

## 2013-07-28 NOTE — Consult Note (Signed)
PULMONARY  / CRITICAL CARE MEDICINE  Name: Robert [name] MRN: [id number] DOB: 11-12-1955    ADMISSION DATE:  06/25/2013 CONSULTATION DATE:  07/28/13  REFERRING MD :  Dr. Sharyon Medicus PRIMARY SERVICE:  [facility]  CHIEF COMPLAINT:  Respiratory Failure  BRIEF PATIENT DESCRIPTION: 57 y/o M with admission to OSH from 11/7-11/12 for N/V with acute renal failure, sepsis related to PNA and question of neuroleptic malignant syndrome (?recieved Dantrolene) PMH of CVA, PAD s/p L BKA, Anemia of Chronic Disease, DM2 and dysphagia  PCCM consulted for resp failure in setting of recurrent aspiration   SIGNIFICANT EVENTS / STUDIES:  11/7 - 11/12 Admit for N/V with acute renal failure,sepsis related to PNA and question of neuroleptic malignant syndrome.  Course complicated by coagulopathy ................................................................................................................................................................................................................................................................ 11/12 - Admit to [facility] 12/12 - Fevers, tolerating RA 12/12 - Intubated with fever of 102.5, AMS, Tachypnea.  12/15 - PCCM consulted for resp fx in setting of recurrent aspiration 12/15 - LUE Doppler>>>  LINES / TUBES: OETT 12/12>>> G-Tube 10/23>>>12/15 GJ Tube 12/15>>> L [place] TLC ? Date>>>>d/c 12/15  CULTURES: UC 12/1>>>neg Resp Culture 12/1>>>neg BCx2 12/1>>>neg UC 12/12>>>neg BCx2 12/12>>> Sputum 12/12>>>neg Cdiff (?date)>>>  ANTIBIOTICS: Meropenem 12/1>>>12/4 Vanco 12/1>>>12/4 Zosyn 12/4>>>12/11 Meropenem 12/12>>> Vanco 12/12>>> Flagyl 12/12 (empiric)>>>   HISTORY OF PRESENT ILLNESS: 57 y/o M, former smoker, with a PMH of CVA, PAD s/p L BKA, Anemia of Chronic Disease, DM2 and dysphagia with admission to OSH from 11/7-11/12 for N/V with acute renal failure, sepsis related to PNA and question of neuroleptic malignant syndrome (?recieved Dantrolene and allergy  listed to reglan).  Initially was d/c'd to SNF from [facility].  Patient was transferred to [facility] on 11/12 and had been doing well.  He was treated with zosyn / levaquin for sepsis related to HCAP.  He was off abx as of 11/19.  Had extended period of improvement and was tolerating room air.  He was cleared for D3 diet on 11/26 and was noted to have a cough with diet and was placed back to NPO.  Tube feeding was continued.  On 12/1, he was noted to have fever up to 103.1 with concerns for repeat aspiration as driving source of fever.  He was treated with meropenem / vanco for aspiration PNA / HCAP.  Pan cultures from that time are negative.  12/4 antibiotics were narrowed.  Pt was tolerating [place] O2 and clinically improving. Unfortunately, patient decompensated on 12/12 and required intubation - notes indicate he was tachypneic with AMS.  12/15 PCCM consulted for respiratory failure with concerns for aspiration. Note patient has central line that appears to have been in place prior to admit as [facility]??  Niece reports pt had a blood clot in LUE at [facility] then had CVA.    PAST MEDICAL HISTORY :  CVA PAD s/p L BKA Anemia of Chronic Disease DM2 Sepsis in setting of PNA Generalized Weakness Acute Renal Failure ? Blood Clot LUE   CURRENT MEDICATIONS  Meropenem Flagyl Vanco NS ASA Novolog SSI ASA Lipitor Pepcid Gabapentin Levemir Lopressor Seroquel Senna  FAMILY HISTORY: No known hx per Niece.   SOCIAL HISTORY:  Former smoker, 1ppd.  Lived at [facility]   REVIEW OF SYSTEMS:  Unable to complete as pt is on vent.  Information obtained from previous medical documentation.   SUBJECTIVE: RT reports pt pending placement of GJ-Tube per IR.  Awake, alert, weaning on PSV 10/5  VITAL SIGNS: Reviewed at bedside.    PHYSICAL EXAMINATION: General:  Chronically ill in  NAD Neuro:  Awake, alert, MAE to command HEENT:  Mm pink/moist, no jvd, OETT Cardiovascular:  s1s2 rrr, no m/r/g Lungs:  resp's  even/non-labored on vent, lungs bilaterally coarse, few rhonchi Abdomen:  Round/soft, bsx4 active Musculoskeletal:  No acute deformities, L BKA Skin:  Warm/dry, trace edema RLE   Recent Labs Lab 07/26/13 0500 07/27/13 0500 07/28/13 0500  NA 143 142 141  K 3.5 3.5 3.4*  CL 110 110 110  CO2 23 23 21   BUN 31* 33* 29*  CREATININE 3.35* 3.47* 3.08*  GLUCOSE 191* 238* 234*    Recent Labs Lab 07/26/13 0500 07/27/13 0500 07/28/13 0500  HGB 8.7* 8.0* 8.4*  HCT 26.6* 24.5* 26.4*  WBC 25.6* 17.3* 14.5*  PLT 388 374 453*   Ct Chest Wo Contrast  07/26/2013   CLINICAL DATA:  History of aspiration pneumonitis  EXAM: CT CHEST WITHOUT CONTRAST  TECHNIQUE: Multidetector CT imaging of the chest was performed following the standard protocol without IV contrast.  COMPARISON:  06/23/2013  FINDINGS: Endotracheal tube is appreciated with tip 2.4 cm above the carina. A left-sided central venous catheter is appreciated with the and left subclavian approach with tip projecting region superior vena cava. Bilateral supraclavicular adenopathy is appreciated bulky on the right than left. The mediastinal adenopathy has slightly increased and ball when compared to previous study. The right paratracheal/ precarinal lymph node measures 1.7 cm in short axis. There is significant decrease in the previously described tracheal secretions.  The bibasilar areas of consolidation have increased slightly in size in conspicuity. The nodular airspace opacities are slightly more. No new focal regions consolidation are new focal infiltrates. Multichamber cardiac enlargement is identified. Coronary artery atherosclerotic calcification appreciated. The visualized upper abdominal viscera is grossly unremarkable. A percutaneous gastric feeding tube is identified projecting within the stomach. Atherosclerotic calcifications identified within mesenteric and renal arteries.  IMPRESSION: Increased bulk of the mediastinal adenopathy, mild.  Also increased consolidative components of the bibasilar infiltrates. The airspace infiltrates also appear slightly more prominent.   Electronically Signed   By: Salome Holmes M.D.   On: 07/26/2013 11:55    ASSESSMENT / PLAN:  Acute Respiratory Failure  Basilar Airspace Disease Concern for Aspiration Former Smoker   Plan: -ok for PSV 10/5 wean for 2 hours 12/15, full support for tx to IR for PEG -if returns late after procedure, SBT in am 12/16 with ABG after 30 minutes for potential extubation -trend CXR -mobilize / upright positioning  -post procedure, allow for wean as tolerated.  Full support overnight.   Recurrent Fever Sepsis   Plan: -concern central line as driving source for fevers -assess LUE doppler for DVT -d/c central line, given 24-48 hour line "holiday".  Consider PICC placement after elapsed peroid -abx as above, can d/c flagyl if c-diff neg - INR appears high -may need correction prior to PEG placement    Canary Brim, NP-C Section Pulmonary & Critical Care Pgr: [phone] or [phone]  Discussed on multidisciplinary rounds with select team  [facility] V. [id number]  07/28/2013, 10:25 AM

## 2013-07-29 ENCOUNTER — Other Ambulatory Visit (HOSPITAL_COMMUNITY): Payer: Medicare Other

## 2013-07-29 DIAGNOSIS — J690 Pneumonitis due to inhalation of food and vomit: Secondary | ICD-10-CM

## 2013-07-29 DIAGNOSIS — J9600 Acute respiratory failure, unspecified whether with hypoxia or hypercapnia: Secondary | ICD-10-CM

## 2013-07-29 NOTE — Progress Notes (Signed)
PULMONARY  / CRITICAL CARE MEDICINE  Name: Robert [name] MRN: [id number] DOB: 02/26/1956    ADMISSION DATE:  06/25/2013 CONSULTATION DATE:  07/28/13  REFERRING MD :  Dr. Sharyon Medicus PRIMARY SERVICE:  [facility]  CHIEF COMPLAINT:  Respiratory Failure  BRIEF PATIENT DESCRIPTION: 57 y/o M with admission to OSH from 11/7-11/12 for N/V with acute renal failure, sepsis related to PNA and question of neuroleptic malignant syndrome (?recieved Dantrolene) PMH of CVA, PAD s/p L BKA, Anemia of Chronic Disease, DM2 and dysphagia  PCCM consulted for resp failure in setting of recurrent aspiration   SIGNIFICANT EVENTS / STUDIES:  11/7 - 11/12 Admit for N/V with acute renal failure,sepsis related to PNA and question of neuroleptic malignant syndrome.  Course complicated by coagulopathy ................................................................................................................................................................................................................................................................ 11/12 - Admit to [facility] 12/12 - Fevers, tolerating RA 12/12 - Intubated with fever of 102.5, AMS, Tachypnea.  12/15 - PCCM consulted for resp fx in setting of recurrent aspiration 12/15 - LUE Doppler>>>neg  LINES / TUBES: OETT 12/12>>> G-Tube 10/23>>>12/15 GJ Tube 12/15>>> L [place] TLC ? Date>>>>rec d/c 12/15>>>>  CULTURES: UC 12/1>>>neg Resp Culture 12/1>>>neg BCx2 12/1>>>neg UC 12/12>>>neg BCx2 12/12>>> Sputum 12/12>>>neg Cdiff (?date)>>>  ANTIBIOTICS: Meropenem 12/1>>>12/4 Vanco 12/1>>>12/4 Zosyn 12/4>>>12/11 Meropenem 12/12>>> Vanco 12/12>>> Flagyl 12/12 (empiric)>>>   SUBJECTIVE: Drowsy, opens eyes, responds appropriately.  Not as awake today as yesterday.   VITAL SIGNS: Reviewed at bedside. WNL 12/16   PHYSICAL EXAMINATION: General:  Chronically ill in NAD Neuro:  Awake, alert, MAE to command HEENT:  Mm pink/moist, no jvd, OETT Cardiovascular:  s1s2  rrr, no m/r/g Lungs:  resp's even/non-labored on vent, lungs bilaterally coarse, few rhonchi Abdomen:  Round/soft, bsx4 active Musculoskeletal:  No acute deformities, L BKA Skin:  Warm/dry, trace edema RLE   Recent Labs Lab 07/26/13 0500 07/27/13 0500 07/28/13 0500  NA 143 142 141  K 3.5 3.5 3.4*  CL 110 110 110  CO2 23 23 21   BUN 31* 33* 29*  CREATININE 3.35* 3.47* 3.08*  GLUCOSE 191* 238* 234*    Recent Labs Lab 07/26/13 0500 07/27/13 0500 07/28/13 0500  HGB 8.7* 8.0* 8.4*  HCT 26.6* 24.5* 26.4*  WBC 25.6* 17.3* 14.5*  PLT 388 374 453*   Ir Gastr Tube Convert Gastr-jej Per W/fl Mod Sed  07/28/2013   CLINICAL DATA:  Respiratory failure and aspiration of gastric contents. The patient has a pre-existing bumper retention gastrostomy tube in place and request is been made to convert the feeding tube to a gastrojejunal catheter.  EXAM: CONVERSION OF GASTROSTOMY TUBE TO GASTROJEJUNAL TUBE.  MEDICATIONS: None  CONTRAST:  10mL OMNIPAQUE IOHEXOL 300 MG/ML  SOLN  FLUOROSCOPY TIME:  16 min.  PROCEDURE: A pre-existing 20 French gastrostomy tube was prepped with Betadine. The tube was injected with contrast. It was then cut to a shorter length. A coaxial 5 French catheter was then advanced through the lumen of the gastrostomy and into the stomach. Two different types of 5 French catheter is and 3 different types of hydrophilic guidewire is within utilized in obtaining catheter access into the duodenum and to the level of the proximal jejunum.  A coaxial 9 French jejunal feeding catheter was advanced over the guidewire. This is injected with contrast material and a fluoroscopic spot image obtained to confirm position. The catheter was flushed with saline.  COMPLICATIONS: None  FINDINGS: The pre-existing gastrostomy tube is patent and lies in the body of the stomach. With initial injection of contrast material, very poor gastric and duodenal motility was  noted. It was difficult to advance a  guidewire into the duodenum due to tortuous anatomy and also potentially some component of spasm or stricture. Ultimately, access was obtained into the proximal jejunum. After advancing the coaxial jejunostomy catheter, the tip lies in the proximal jejunum several cm beyond the ligament of Treitz.  IMPRESSION: Successful conversion of pre-existing balloon retention 20 French gastrostomy tube to a gastrojejunal feeding catheter with advancement of a 9 French coaxial catheter through the pre-existing gastrostomy and into the proximal jejunum.   Electronically Signed   By: Irish Lack M.D.   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 Stable positioning of support apparatus. No pneumothorax. Grossly unchanged extensive bilateral mid and lower lung heterogeneous/consolidative opacities, left greater than right. Small left-sided pleural effusion, unchanged. No pneumothorax. Unchanged bones.  IMPRESSION: 1.  Stable positioning of support apparatus.  No pneumothorax. 2. Grossly unchanged bilateral mid and lower lung airspace opacities again worrisome for multifocal infection.   Electronically Signed   By: Simonne Come M.D.   On: 07/29/2013 08:22    ASSESSMENT / PLAN:  Acute Respiratory Failure  Basilar Airspace Disease  Aspiration pneumonia Former Smoker   Plan: -SBTs as tolerated, OK to extubate once awake -Has good mechanics.  -Trend CXR -mobilize / upright positioning  -consider lasix (AKI with sr cr of 3.08)  Recurrent Fever Sepsis  R/O DVT - LUE doppler neg 12/15 R/O C-Diff - neg PCR 12/15  Plan: -concern central line as driving source for fevers, recommend place PIV and give line "holiday for 24-48 hours.  Consider PICC  after elapsed 48 hours -abx as above -rec d/c flagyl as c-diff neg    Canary Brim, NP-C [place] [facility] & Critical [facility] Pgr: [phone] or [phone]  Independently examined pt, evaluated data & formulated above care plan with NP who scribed this note & edited by me. Discussed on multidisciplinary rounds with select team  Robert Cosma V.

## 2013-07-30 ENCOUNTER — Other Ambulatory Visit (HOSPITAL_COMMUNITY): Payer: Medicare Other

## 2013-07-30 LAB — BASIC METABOLIC PANEL
CO2: 21 mEq/L (ref 19–32)
Calcium: 8.1 mg/dL — ABNORMAL LOW (ref 8.4–10.5)
GFR calc non Af Amer: 22 mL/min — ABNORMAL LOW (ref >90)
Glucose, Bld: 149 mg/dL — ABNORMAL HIGH (ref 70–99)
Potassium: 3.4 mEq/L — ABNORMAL LOW (ref 3.5–5.1)
Sodium: 142 mEq/L (ref 135–145)

## 2013-07-30 LAB — CBC
MCV: 72.5 fL — ABNORMAL LOW (ref 78.0–100.0)
Platelets: 561 10*3/uL — ABNORMAL HIGH (ref 150–400)
RBC: 3.71 MIL/uL — ABNORMAL LOW (ref 4.22–5.81)
RDW: 16.4 % — ABNORMAL HIGH (ref 11.5–15.5)
WBC: 11.4 10*3/uL — ABNORMAL HIGH (ref 4.0–10.5)

## 2013-07-30 LAB — VANCOMYCIN, RANDOM: Vancomycin Rm: 25.4 ug/mL

## 2013-07-30 LAB — PROCALCITONIN: Procalcitonin: 0.28 ng/mL

## 2013-07-30 NOTE — Progress Notes (Signed)
PULMONARY  / CRITICAL CARE MEDICINE  Name: Robert [name] MRN: [id number] DOB: Nov 26, 1955    ADMISSION DATE:  06/25/2013 CONSULTATION DATE:  07/28/13  REFERRING MD :  Dr. Sharyon Medicus PRIMARY SERVICE:  [facility]  CHIEF COMPLAINT:  Respiratory Failure  BRIEF PATIENT DESCRIPTION: 57 y/o M with admission to OSH from 11/7-11/12 for N/V with acute renal failure, sepsis related to PNA and question of neuroleptic malignant syndrome (?recieved Dantrolene) PMH of CVA, PAD s/p L BKA, Anemia of Chronic Disease, DM2 and dysphagia  PCCM consulted for resp failure in setting of recurrent aspiration   SIGNIFICANT EVENTS / STUDIES:  11/7 - 11/12 Admit for N/V with acute renal failure,sepsis related to PNA and question of neuroleptic malignant syndrome.  Course complicated by coagulopathy ................................................................................................................................................................................................................................................................ 11/12 - Admit to [facility] 12/12 - Fevers, tolerating RA 12/12 - Intubated with fever of 102.5, AMS, Tachypnea.  12/15 - PCCM consulted for resp fx in setting of recurrent aspiration 12/15 - LUE Doppler>>>neg  LINES / TUBES: OETT 12/12>>> G-Tube 10/23>>>12/15 GJ Tube 12/15>>> L [place] TLC ? Date>>>>rec d/c 12/15>>>>  CULTURES: UC 12/1>>>neg Resp Culture 12/1>>>neg BCx2 12/1>>>neg UC 12/12>>>neg BCx2 12/12>>> Sputum 12/12>>>neg Cdiff (?date)>>>  ANTIBIOTICS: Meropenem 12/1>>>12/4 Vanco 12/1>>>12/4 Zosyn 12/4>>>12/11 Meropenem 12/12>>> Vanco 12/12>>> Flagyl 12/12 (empiric)>>>   SUBJECTIVE: Extubated, no distress.  Up in chair.   VITAL SIGNS: Reviewed at bedside.   PHYSICAL EXAMINATION: General:  Chronically ill in NAD Neuro:  Awake, alert, speech clear HEENT:  Mm pink/moist, no jvd Cardiovascular:  s1s2 rrr, no m/r/g Lungs:  resp's even/non-labored on 2L [place],  lungs bilaterally coarse, few rhonchi Abdomen:  Round/soft, bsx4 active Musculoskeletal:  No acute deformities, L BKA Skin:  Warm/dry, trace edema RLE   Recent Labs Lab 07/27/13 0500 07/28/13 0500 07/30/13 0545  NA 142 141 142  K 3.5 3.4* 3.4*  CL 110 110 110  CO2 23 21 21   BUN 33* 29* 27*  CREATININE 3.47* 3.08* 2.99*  GLUCOSE 238* 234* 149*    Recent Labs Lab 07/27/13 0500 07/28/13 0500 07/30/13 0545  HGB 8.0* 8.4* 8.7*  HCT 24.5* 26.4* 26.9*  WBC 17.3* 14.5* 11.4*  PLT 374 453* 561*   Ir Gastr Tube Convert Gastr-jej Per W/fl Mod Sed  07/28/2013   CLINICAL DATA:  Respiratory failure and aspiration of gastric contents. The patient has a pre-existing bumper retention gastrostomy tube in place and request is been made to convert the feeding tube to a gastrojejunal catheter.  EXAM: CONVERSION OF GASTROSTOMY TUBE TO GASTROJEJUNAL TUBE.  MEDICATIONS: None  CONTRAST:  10mL OMNIPAQUE IOHEXOL 300 MG/ML  SOLN  FLUOROSCOPY TIME:  16 min.  PROCEDURE: A pre-existing 20 French gastrostomy tube was prepped with Betadine. The tube was injected with contrast. It was then cut to a shorter length. A coaxial 5 French catheter was then advanced through the lumen of the gastrostomy and into the stomach. Two different types of 5 French catheter is and 3 different types of hydrophilic guidewire is within utilized in obtaining catheter access into the duodenum and to the level of the proximal jejunum.  A coaxial 9 French jejunal feeding catheter was advanced over the guidewire. This is injected with contrast material and a fluoroscopic spot image obtained to confirm position. The catheter was flushed with saline.  COMPLICATIONS: None  FINDINGS: The pre-existing gastrostomy tube is patent and lies in the body of the stomach. With initial injection of contrast material, very poor gastric and duodenal motility was noted. It was difficult to advance a guidewire  into the duodenum due to tortuous anatomy and also  potentially some component of spasm or stricture. Ultimately, access was obtained into the proximal jejunum. After advancing the coaxial jejunostomy catheter, the tip lies in the proximal jejunum several cm beyond the ligament of Treitz.  IMPRESSION: Successful conversion of pre-existing balloon retention 20 French gastrostomy tube to a gastrojejunal feeding catheter with advancement of a 9 French coaxial catheter through the pre-existing gastrostomy and into the proximal jejunum.   Electronically Signed   By: Irish Lack M.D.   On: 07/28/2013 16:12   Dg Chest Port 1 View  07/30/2013   CLINICAL DATA:  Evaluate endotracheal tube, airspace disease  EXAM: PORTABLE CHEST - 1 VIEW  COMPARISON:  07/29/2013; 07/25/2013; chest CT - 07/26/2013  FINDINGS: Grossly unchanged borderline enlarged cardiac silhouette and mediastinal contours given decreased lung volumes. 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 Interval extubation. Otherwise, stable position remaining support apparatus. No pneumothorax. Worsening bilateral mid and lower lung slightly nodular airspace opacities, left greater than right. Interval development of a small left-sided effusion. Mild cephalization of flow. Unchanged bones.  IMPRESSION: 1. Interval extubation. Otherwise, stable position remaining support apparatus. No pneumothorax. 2. Decreased lung volumes with worsening bilateral mid and lower lung slightly nodular airspace opacities, left greater than right, worrisome for progression of multifocal infection. 3. Mild cephalization of flow without definite evidence of edema.   Electronically Signed   By: Simonne Come M.D.   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  Stable positioning of support apparatus. No pneumothorax. Grossly unchanged extensive bilateral mid and lower lung heterogeneous/consolidative opacities, left greater than right. Small left-sided pleural effusion, unchanged. No pneumothorax. Unchanged bones.  IMPRESSION: 1.  Stable positioning of support apparatus.  No pneumothorax. 2. Grossly unchanged bilateral mid and lower lung airspace opacities again worrisome for multifocal infection.   Electronically Signed   By: Simonne Come M.D.   On: 07/29/2013 08:22    ASSESSMENT / PLAN:  Acute Respiratory Failure  Basilar Airspace Disease  Aspiration pneumonia Former Smoker   Plan: -Trend CXR -mobilize / upright positioning  -consider intermittent lasix (AKI with sr cr of 3.08)  Recurrent Fever Sepsis  R/O DVT - LUE doppler neg 12/15 R/O C-Diff - neg PCR 12/15  Plan: -concern central line as driving source for fevers, recommend place PIV and give line "holiday for 24-48 hours.  Consider PICC after elapsed 48 hours.  Remains in place 12/17.  Discussed with RN regarding removal / instructed on how to remove.  -abx as above -rec d/c flagyl as c-diff neg   PCCM will be available PRN.    Canary Brim, NP-C [facility] Pgr: [phone] or [phone] . Discussed on multidisciplinary rounds with select team  Ivaan Liddy V.

## 2013-07-31 LAB — CULTURE, BLOOD (ROUTINE X 2): Culture: NO GROWTH

## 2013-08-01 ENCOUNTER — Other Ambulatory Visit (HOSPITAL_COMMUNITY): Payer: Medicare Other

## 2013-08-01 LAB — CBC
Hemoglobin: 8.9 g/dL — ABNORMAL LOW (ref 13.0–17.0)
RBC: 3.87 MIL/uL — ABNORMAL LOW (ref 4.22–5.81)
WBC: 12.3 10*3/uL — ABNORMAL HIGH (ref 4.0–10.5)

## 2013-08-01 LAB — BASIC METABOLIC PANEL
CO2: 23 mEq/L (ref 19–32)
Chloride: 112 mEq/L (ref 96–112)
Glucose, Bld: 189 mg/dL — ABNORMAL HIGH (ref 70–99)
Potassium: 4.2 mEq/L (ref 3.5–5.1)
Sodium: 141 mEq/L (ref 135–145)

## 2013-08-03 ENCOUNTER — Other Ambulatory Visit (HOSPITAL_COMMUNITY): Payer: Medicare Other

## 2013-08-03 ENCOUNTER — Other Ambulatory Visit (HOSPITAL_COMMUNITY): Payer: Self-pay

## 2013-08-03 LAB — BASIC METABOLIC PANEL
BUN: 35 mg/dL — ABNORMAL HIGH (ref 6–23)
CO2: 21 mEq/L (ref 19–32)
Calcium: 8.2 mg/dL — ABNORMAL LOW (ref 8.4–10.5)
Chloride: 111 mEq/L (ref 96–112)
Creatinine, Ser: 2.26 mg/dL — ABNORMAL HIGH (ref 0.50–1.35)
Glucose, Bld: 366 mg/dL — ABNORMAL HIGH (ref 70–99)

## 2013-08-03 LAB — CBC
HCT: 29.1 % — ABNORMAL LOW (ref 39.0–52.0)
Hemoglobin: 9.4 g/dL — ABNORMAL LOW (ref 13.0–17.0)
MCV: 72.4 fL — ABNORMAL LOW (ref 78.0–100.0)
RBC: 4.02 MIL/uL — ABNORMAL LOW (ref 4.22–5.81)
WBC: 23.2 10*3/uL — ABNORMAL HIGH (ref 4.0–10.5)

## 2013-08-03 LAB — PRO B NATRIURETIC PEPTIDE: Pro B Natriuretic peptide (BNP): 2382 pg/mL — ABNORMAL HIGH (ref 0–125)

## 2013-08-04 ENCOUNTER — Other Ambulatory Visit (HOSPITAL_COMMUNITY): Payer: Self-pay

## 2013-08-04 ENCOUNTER — Other Ambulatory Visit (HOSPITAL_COMMUNITY): Payer: Medicare Other

## 2013-08-04 LAB — CBC
MCH: 23.1 pg — ABNORMAL LOW (ref 26.0–34.0)
Platelets: 510 10*3/uL — ABNORMAL HIGH (ref 150–400)
RBC: 4.03 MIL/uL — ABNORMAL LOW (ref 4.22–5.81)
WBC: 20.4 10*3/uL — ABNORMAL HIGH (ref 4.0–10.5)

## 2013-08-04 LAB — BASIC METABOLIC PANEL
CO2: 27 mEq/L (ref 19–32)
Calcium: 8.4 mg/dL (ref 8.4–10.5)
Chloride: 112 mEq/L (ref 96–112)
Glucose, Bld: 204 mg/dL — ABNORMAL HIGH (ref 70–99)
Sodium: 146 mEq/L — ABNORMAL HIGH (ref 135–145)

## 2013-08-04 LAB — VANCOMYCIN, RANDOM: Vancomycin Rm: 16.8 ug/mL

## 2013-08-06 LAB — CBC
MCHC: 32.3 g/dL (ref 30.0–36.0)
MCV: 72.1 fL — ABNORMAL LOW (ref 78.0–100.0)
Platelets: 469 10*3/uL — ABNORMAL HIGH (ref 150–400)
RBC: 4.12 MIL/uL — ABNORMAL LOW (ref 4.22–5.81)
WBC: 17.2 10*3/uL — ABNORMAL HIGH (ref 4.0–10.5)

## 2013-08-06 LAB — BASIC METABOLIC PANEL
CO2: 26 mEq/L (ref 19–32)
Calcium: 8.5 mg/dL (ref 8.4–10.5)
Creatinine, Ser: 2.12 mg/dL — ABNORMAL HIGH (ref 0.50–1.35)
GFR calc Af Amer: 39 mL/min — ABNORMAL LOW (ref >90)
Sodium: 143 mEq/L (ref 135–145)

## 2013-08-06 LAB — POTASSIUM: Potassium: 4.7 mEq/L (ref 3.5–5.1)

## 2013-08-07 LAB — BASIC METABOLIC PANEL
BUN: 36 mg/dL — ABNORMAL HIGH (ref 6–23)
CO2: 29 mEq/L (ref 19–32)
Calcium: 8.5 mg/dL (ref 8.4–10.5)
Chloride: 111 mEq/L (ref 96–112)
Glucose, Bld: 110 mg/dL — ABNORMAL HIGH (ref 70–99)
Potassium: 4.5 mEq/L (ref 3.5–5.1)

## 2013-08-07 LAB — CBC
HCT: 28.7 % — ABNORMAL LOW (ref 39.0–52.0)
Hemoglobin: 9.1 g/dL — ABNORMAL LOW (ref 13.0–17.0)
MCH: 23.3 pg — ABNORMAL LOW (ref 26.0–34.0)
MCHC: 31.7 g/dL (ref 30.0–36.0)
Platelets: 421 10*3/uL — ABNORMAL HIGH (ref 150–400)
RBC: 3.91 MIL/uL — ABNORMAL LOW (ref 4.22–5.81)
WBC: 15.3 10*3/uL — ABNORMAL HIGH (ref 4.0–10.5)

## 2013-08-07 LAB — MAGNESIUM: Magnesium: 1.9 mg/dL (ref 1.5–2.5)

## 2013-08-07 LAB — PHOSPHORUS: Phosphorus: 3.3 mg/dL (ref 2.3–4.6)

## 2013-08-08 LAB — CBC
HCT: 26.4 % — ABNORMAL LOW (ref 39.0–52.0)
Hemoglobin: 8.3 g/dL — ABNORMAL LOW (ref 13.0–17.0)
MCHC: 31.4 g/dL (ref 30.0–36.0)
RBC: 3.56 MIL/uL — ABNORMAL LOW (ref 4.22–5.81)
WBC: 12.2 10*3/uL — ABNORMAL HIGH (ref 4.0–10.5)

## 2013-08-08 LAB — BASIC METABOLIC PANEL
BUN: 31 mg/dL — ABNORMAL HIGH (ref 6–23)
Chloride: 109 mEq/L (ref 96–112)
Glucose, Bld: 187 mg/dL — ABNORMAL HIGH (ref 70–99)
Potassium: 4.4 mEq/L (ref 3.5–5.1)
Sodium: 146 mEq/L — ABNORMAL HIGH (ref 135–145)

## 2013-08-09 LAB — BASIC METABOLIC PANEL
BUN: 30 mg/dL — ABNORMAL HIGH (ref 6–23)
CO2: 30 mEq/L (ref 19–32)
Chloride: 106 mEq/L (ref 96–112)
Creatinine, Ser: 2.03 mg/dL — ABNORMAL HIGH (ref 0.50–1.35)
Potassium: 4.4 mEq/L (ref 3.5–5.1)

## 2013-08-10 LAB — CBC WITH DIFFERENTIAL/PLATELET
Basophils Absolute: 0.1 10*3/uL (ref 0.0–0.1)
Eosinophils Relative: 18 % — ABNORMAL HIGH (ref 0–5)
HCT: 25.1 % — ABNORMAL LOW (ref 39.0–52.0)
Hemoglobin: 7.9 g/dL — ABNORMAL LOW (ref 13.0–17.0)
Lymphocytes Relative: 32 % (ref 12–46)
MCV: 73.4 fL — ABNORMAL LOW (ref 78.0–100.0)
Monocytes Absolute: 0.9 10*3/uL (ref 0.1–1.0)
Monocytes Relative: 9 % (ref 3–12)
Neutro Abs: 4 10*3/uL (ref 1.7–7.7)
Platelets: 406 10*3/uL — ABNORMAL HIGH (ref 150–400)
RBC: 3.42 MIL/uL — ABNORMAL LOW (ref 4.22–5.81)
RDW: 17.5 % — ABNORMAL HIGH (ref 11.5–15.5)
WBC: 10 10*3/uL (ref 4.0–10.5)

## 2013-08-22 ENCOUNTER — Other Ambulatory Visit: Payer: Self-pay | Admitting: Family Medicine

## 2013-08-22 LAB — SODIUM: SODIUM: 143 mmol/L (ref 136–145)

## 2013-08-22 LAB — POTASSIUM: POTASSIUM: 5.6 mmol/L — AB (ref 3.5–5.1)

## 2013-08-25 ENCOUNTER — Ambulatory Visit: Payer: Self-pay | Admitting: Family Medicine

## 2013-08-29 ENCOUNTER — Other Ambulatory Visit: Payer: Self-pay | Admitting: Family Medicine

## 2013-08-30 ENCOUNTER — Other Ambulatory Visit: Payer: Self-pay

## 2013-08-30 LAB — BASIC METABOLIC PANEL
Anion Gap: 7 (ref 7–16)
BUN: 80 mg/dL — ABNORMAL HIGH (ref 7–18)
CALCIUM: 9.4 mg/dL (ref 8.5–10.1)
Chloride: 105 mmol/L (ref 98–107)
Co2: 33 mmol/L — ABNORMAL HIGH (ref 21–32)
Creatinine: 2.8 mg/dL — ABNORMAL HIGH (ref 0.60–1.30)
EGFR (Non-African Amer.): 24 — ABNORMAL LOW
GFR CALC AF AMER: 28 — AB
Glucose: 253 mg/dL — ABNORMAL HIGH (ref 65–99)
Osmolality: 321 (ref 275–301)
POTASSIUM: 4.2 mmol/L (ref 3.5–5.1)
Sodium: 145 mmol/L (ref 136–145)

## 2013-08-31 ENCOUNTER — Other Ambulatory Visit: Payer: Self-pay | Admitting: Family Medicine

## 2013-08-31 LAB — BASIC METABOLIC PANEL
Anion Gap: 5 — ABNORMAL LOW (ref 7–16)
BUN: 76 mg/dL — ABNORMAL HIGH (ref 7–18)
CALCIUM: 9.4 mg/dL (ref 8.5–10.1)
CHLORIDE: 109 mmol/L — AB (ref 98–107)
Co2: 32 mmol/L (ref 21–32)
Creatinine: 2.85 mg/dL — ABNORMAL HIGH (ref 0.60–1.30)
EGFR (African American): 28 — ABNORMAL LOW
EGFR (Non-African Amer.): 24 — ABNORMAL LOW
Glucose: 157 mg/dL — ABNORMAL HIGH (ref 65–99)
Osmolality: 316 (ref 275–301)
Potassium: 4.1 mmol/L (ref 3.5–5.1)
SODIUM: 146 mmol/L — AB (ref 136–145)

## 2013-09-22 ENCOUNTER — Inpatient Hospital Stay: Payer: Self-pay | Admitting: Internal Medicine

## 2013-09-22 LAB — DRUG SCREEN, URINE

## 2013-09-22 LAB — COMPREHENSIVE METABOLIC PANEL
ALBUMIN: 1.6 g/dL — AB (ref 3.4–5.0)
Alkaline Phosphatase: 196 U/L — ABNORMAL HIGH
Anion Gap: 7 (ref 7–16)
BILIRUBIN TOTAL: 0.5 mg/dL (ref 0.2–1.0)
BUN: 55 mg/dL — ABNORMAL HIGH (ref 7–18)
CHLORIDE: 80 mmol/L — AB (ref 98–107)
CREATININE: 4.96 mg/dL — AB (ref 0.60–1.30)
Calcium, Total: 8.1 mg/dL — ABNORMAL LOW (ref 8.5–10.1)
Co2: 28 mmol/L (ref 21–32)
EGFR (African American): 14 — ABNORMAL LOW
GFR CALC NON AF AMER: 12 — AB
GLUCOSE: 214 mg/dL — AB (ref 65–99)
Osmolality: 254 (ref 275–301)
POTASSIUM: 5.4 mmol/L — AB (ref 3.5–5.1)
SGOT(AST): 73 U/L — ABNORMAL HIGH (ref 15–37)
SGPT (ALT): 28 U/L (ref 12–78)
Sodium: 115 mmol/L — CL (ref 136–145)
TOTAL PROTEIN: 6.6 g/dL (ref 6.4–8.2)

## 2013-09-22 LAB — CBC
HCT: 26.5 % — AB (ref 40.0–52.0)
HGB: 8.7 g/dL — ABNORMAL LOW (ref 13.0–18.0)
MCH: 23.8 pg — AB (ref 26.0–34.0)
MCHC: 32.7 g/dL (ref 32.0–36.0)
MCV: 73 fL — ABNORMAL LOW (ref 80–100)
PLATELETS: 334 10*3/uL (ref 150–440)
RBC: 3.63 10*6/uL — AB (ref 4.40–5.90)
RDW: 16.8 % — ABNORMAL HIGH (ref 11.5–14.5)
WBC: 16 10*3/uL — ABNORMAL HIGH (ref 3.8–10.6)

## 2013-09-22 LAB — URINALYSIS, COMPLETE
Bilirubin,UR: NEGATIVE
Glucose,UR: NEGATIVE mg/dL (ref 0–75)
Ketone: NEGATIVE
NITRITE: NEGATIVE
PH: 5 (ref 4.5–8.0)
Protein: >=500
RBC,UR: 24 /HPF (ref 0–5)
Specific Gravity: 1.015 (ref 1.003–1.030)

## 2013-09-22 LAB — CK TOTAL AND CKMB (NOT AT ARMC)
CK, TOTAL: 960 U/L — AB
CK, Total: 1022 U/L — ABNORMAL HIGH
CK-MB: 3.2 ng/mL (ref 0.5–3.6)
CK-MB: 3.5 ng/mL (ref 0.5–3.6)

## 2013-09-22 LAB — AMMONIA

## 2013-09-22 LAB — CLOSTRIDIUM DIFFICILE(ARMC)

## 2013-09-22 LAB — TROPONIN I
TROPONIN-I: 0.1 ng/mL — AB
Troponin-I: 0.1 ng/mL — ABNORMAL HIGH

## 2013-09-22 LAB — ETHANOL
Ethanol %: <0.003 % (ref 0.000–0.080)
Ethanol: <3 mg/dL

## 2013-09-23 LAB — BASIC METABOLIC PANEL
ANION GAP: 10 (ref 7–16)
Anion Gap: 9 (ref 7–16)
BUN: 58 mg/dL — ABNORMAL HIGH (ref 7–18)
BUN: 59 mg/dL — ABNORMAL HIGH (ref 7–18)
CHLORIDE: 82 mmol/L — AB (ref 98–107)
CREATININE: 4.86 mg/dL — AB (ref 0.60–1.30)
CREATININE: 5.24 mg/dL — AB (ref 0.60–1.30)
Calcium, Total: 8.5 mg/dL (ref 8.5–10.1)
Calcium, Total: 8.2 mg/dL — ABNORMAL LOW (ref 8.5–10.1)
Chloride: 80 mmol/L — ABNORMAL LOW (ref 98–107)
Co2: 24 mmol/L (ref 21–32)
Co2: 25 mmol/L (ref 21–32)
EGFR (African American): 14 — ABNORMAL LOW
EGFR (African American): 13 — ABNORMAL LOW
EGFR (Non-African Amer.): 12 — ABNORMAL LOW
GFR CALC NON AF AMER: 11 — AB
GLUCOSE: 313 mg/dL — AB (ref 65–99)
Glucose: 295 mg/dL — ABNORMAL HIGH (ref 65–99)
OSMOLALITY: 258 (ref 275–301)
Osmolality: 263 (ref 275–301)
POTASSIUM: 5.1 mmol/L (ref 3.5–5.1)
POTASSIUM: 4.1 mmol/L (ref 3.5–5.1)
SODIUM: 114 mmol/L — AB (ref 136–145)
SODIUM: 116 mmol/L — AB (ref 136–145)

## 2013-09-23 LAB — CBC WITH DIFFERENTIAL/PLATELET
BASOS PCT: 0.3 %
Basophil #: 0 10*3/uL (ref 0.0–0.1)
Eosinophil #: 0.1 10*3/uL (ref 0.0–0.7)
Eosinophil %: 0.8 %
HCT: 28.3 % — AB (ref 40.0–52.0)
HGB: 9.5 g/dL — AB (ref 13.0–18.0)
LYMPHS PCT: 8.4 %
Lymphocyte #: 1.1 10*3/uL (ref 1.0–3.6)
MCH: 24.6 pg — AB (ref 26.0–34.0)
MCHC: 33.7 g/dL (ref 32.0–36.0)
MCV: 73 fL — ABNORMAL LOW (ref 80–100)
Monocyte #: 0.6 x10 3/mm (ref 0.2–1.0)
Monocyte %: 4.5 %
NEUTROS ABS: 11.5 10*3/uL — AB (ref 1.4–6.5)
NEUTROS PCT: 86 %
Platelet: 370 10*3/uL (ref 150–440)
RBC: 3.87 10*6/uL — ABNORMAL LOW (ref 4.40–5.90)
RDW: 16.9 % — ABNORMAL HIGH (ref 11.5–14.5)
WBC: 13.3 10*3/uL — AB (ref 3.8–10.6)

## 2013-09-23 LAB — TROPONIN I: Troponin-I: 0.08 ng/mL — ABNORMAL HIGH

## 2013-09-23 LAB — MAGNESIUM: Magnesium: 2.1 mg/dL

## 2013-09-23 LAB — CK TOTAL AND CKMB (NOT AT ARMC)
CK, TOTAL: 872 U/L — AB
CK-MB: 3.7 ng/mL — ABNORMAL HIGH (ref 0.5–3.6)

## 2013-09-24 LAB — CBC WITH DIFFERENTIAL/PLATELET
BASOS PCT: 0.6 %
Basophil #: 0.1 10*3/uL (ref 0.0–0.1)
EOS PCT: 0.5 %
Eosinophil #: 0.1 10*3/uL (ref 0.0–0.7)
HCT: 24 % — ABNORMAL LOW (ref 40.0–52.0)
HGB: 8 g/dL — AB (ref 13.0–18.0)
LYMPHS ABS: 0.9 10*3/uL — AB (ref 1.0–3.6)
Lymphocyte %: 5.3 %
MCH: 24.5 pg — ABNORMAL LOW (ref 26.0–34.0)
MCHC: 33.5 g/dL (ref 32.0–36.0)
MCV: 73 fL — ABNORMAL LOW (ref 80–100)
MONO ABS: 1.4 x10 3/mm — AB (ref 0.2–1.0)
MONOS PCT: 8.6 %
Neutrophil #: 13.7 10*3/uL — ABNORMAL HIGH (ref 1.4–6.5)
Neutrophil %: 85 %
Platelet: 340 10*3/uL (ref 150–440)
RBC: 3.29 10*6/uL — ABNORMAL LOW (ref 4.40–5.90)
RDW: 16.7 % — ABNORMAL HIGH (ref 11.5–14.5)
WBC: 16.1 10*3/uL — AB (ref 3.8–10.6)

## 2013-09-24 LAB — BASIC METABOLIC PANEL
Anion Gap: 11 (ref 7–16)
BUN: 59 mg/dL — ABNORMAL HIGH (ref 7–18)
CO2: 25 mmol/L (ref 21–32)
Calcium, Total: 7.9 mg/dL — ABNORMAL LOW (ref 8.5–10.1)
Chloride: 83 mmol/L — ABNORMAL LOW (ref 98–107)
Creatinine: 5.31 mg/dL — ABNORMAL HIGH (ref 0.60–1.30)
EGFR (African American): 13 — ABNORMAL LOW
EGFR (Non-African Amer.): 11 — ABNORMAL LOW
Glucose: 294 mg/dL — ABNORMAL HIGH (ref 65–99)
Osmolality: 268 (ref 275–301)
Potassium: 4 mmol/L (ref 3.5–5.1)
SODIUM: 119 mmol/L — AB (ref 136–145)

## 2013-09-24 LAB — PHOSPHORUS: Phosphorus: 3.6 mg/dL (ref 2.5–4.9)

## 2013-09-24 LAB — MAGNESIUM: Magnesium: 2.2 mg/dL

## 2013-09-25 ENCOUNTER — Ambulatory Visit: Payer: Self-pay | Admitting: Hospice and Palliative Medicine

## 2013-09-25 LAB — CBC WITH DIFFERENTIAL/PLATELET
BASOS PCT: 0.3 %
Basophil #: 0 10*3/uL (ref 0.0–0.1)
EOS PCT: 0.8 %
Eosinophil #: 0.1 10*3/uL (ref 0.0–0.7)
HCT: 23.7 % — AB (ref 40.0–52.0)
HGB: 7.6 g/dL — AB (ref 13.0–18.0)
LYMPHS ABS: 1.1 10*3/uL (ref 1.0–3.6)
Lymphocyte %: 7.3 %
MCH: 23.4 pg — ABNORMAL LOW (ref 26.0–34.0)
MCHC: 32.2 g/dL (ref 32.0–36.0)
MCV: 73 fL — ABNORMAL LOW (ref 80–100)
MONOS PCT: 12.1 %
Monocyte #: 1.8 x10 3/mm — ABNORMAL HIGH (ref 0.2–1.0)
NEUTROS PCT: 79.5 %
Neutrophil #: 12 10*3/uL — ABNORMAL HIGH (ref 1.4–6.5)
Platelet: 276 10*3/uL (ref 150–440)
RBC: 3.25 10*6/uL — ABNORMAL LOW (ref 4.40–5.90)
RDW: 16.3 % — ABNORMAL HIGH (ref 11.5–14.5)
WBC: 15.1 10*3/uL — ABNORMAL HIGH (ref 3.8–10.6)

## 2013-09-25 LAB — BASIC METABOLIC PANEL
ANION GAP: 14 (ref 7–16)
Anion Gap: 12 (ref 7–16)
BUN: 69 mg/dL — ABNORMAL HIGH (ref 7–18)
BUN: 73 mg/dL — ABNORMAL HIGH (ref 7–18)
CALCIUM: 8.3 mg/dL — AB (ref 8.5–10.1)
CHLORIDE: 87 mmol/L — AB (ref 98–107)
CO2: 22 mmol/L (ref 21–32)
CO2: 22 mmol/L (ref 21–32)
Calcium, Total: 7.9 mg/dL — ABNORMAL LOW (ref 8.5–10.1)
Chloride: 89 mmol/L — ABNORMAL LOW (ref 98–107)
Creatinine: 5.69 mg/dL — ABNORMAL HIGH (ref 0.60–1.30)
Creatinine: 5.84 mg/dL — ABNORMAL HIGH (ref 0.60–1.30)
EGFR (African American): 12 — ABNORMAL LOW
EGFR (Non-African Amer.): 10 — ABNORMAL LOW
GFR CALC AF AMER: 12 — AB
GFR CALC NON AF AMER: 10 — AB
GLUCOSE: 368 mg/dL — AB (ref 65–99)
Glucose: 304 mg/dL — ABNORMAL HIGH (ref 65–99)
OSMOLALITY: 279 (ref 275–301)
OSMOLALITY: 284 (ref 275–301)
POTASSIUM: 3.9 mmol/L (ref 3.5–5.1)
Potassium: 4.3 mmol/L (ref 3.5–5.1)
Sodium: 123 mmol/L — ABNORMAL LOW (ref 136–145)
Sodium: 123 mmol/L — ABNORMAL LOW (ref 136–145)

## 2013-09-25 LAB — CLOSTRIDIUM DIFFICILE(ARMC)

## 2013-09-26 LAB — URINALYSIS, COMPLETE
BILIRUBIN, UR: NEGATIVE
Bacteria: NONE SEEN
Glucose,UR: NEGATIVE mg/dL (ref 0–75)
Ketone: NEGATIVE
NITRITE: NEGATIVE
Ph: 5 (ref 4.5–8.0)
RBC,UR: 27 /HPF (ref 0–5)
SQUAMOUS EPITHELIAL: NONE SEEN
Specific Gravity: 1.011 (ref 1.003–1.030)
WBC UR: 513 /HPF (ref 0–5)

## 2013-09-26 LAB — BASIC METABOLIC PANEL
ANION GAP: 11 (ref 7–16)
BUN: 82 mg/dL — ABNORMAL HIGH (ref 7–18)
CALCIUM: 8.1 mg/dL — AB (ref 8.5–10.1)
CO2: 24 mmol/L (ref 21–32)
CREATININE: 6.11 mg/dL — AB (ref 0.60–1.30)
Chloride: 90 mmol/L — ABNORMAL LOW (ref 98–107)
GFR CALC AF AMER: 11 — AB
GFR CALC NON AF AMER: 9 — AB
GLUCOSE: 93 mg/dL (ref 65–99)
OSMOLALITY: 276 (ref 275–301)
POTASSIUM: 3.4 mmol/L — AB (ref 3.5–5.1)
SODIUM: 125 mmol/L — AB (ref 136–145)

## 2013-09-26 LAB — CBC WITH DIFFERENTIAL/PLATELET
Basophil #: 0.2 10*3/uL — ABNORMAL HIGH (ref 0.0–0.1)
Basophil %: 1.3 %
EOS ABS: 0.7 10*3/uL (ref 0.0–0.7)
Eosinophil %: 4.5 %
HCT: 22.1 % — AB (ref 40.0–52.0)
HGB: 7.5 g/dL — ABNORMAL LOW (ref 13.0–18.0)
LYMPHS ABS: 1.7 10*3/uL (ref 1.0–3.6)
LYMPHS PCT: 10.4 %
MCH: 24.4 pg — ABNORMAL LOW (ref 26.0–34.0)
MCHC: 34 g/dL (ref 32.0–36.0)
MCV: 72 fL — ABNORMAL LOW (ref 80–100)
MONOS PCT: 11.3 %
Monocyte #: 1.8 x10 3/mm — ABNORMAL HIGH (ref 0.2–1.0)
Neutrophil #: 11.9 10*3/uL — ABNORMAL HIGH (ref 1.4–6.5)
Neutrophil %: 72.5 %
Platelet: 283 10*3/uL (ref 150–440)
RBC: 3.09 10*6/uL — AB (ref 4.40–5.90)
RDW: 16.2 % — AB (ref 11.5–14.5)
WBC: 16.4 10*3/uL — AB (ref 3.8–10.6)

## 2013-09-26 LAB — TRIGLYCERIDES: Triglycerides: 101 mg/dL (ref 0–200)

## 2013-09-26 LAB — URINE CULTURE

## 2013-09-27 LAB — CBC WITH DIFFERENTIAL/PLATELET
Basophil #: 0.1 10*3/uL (ref 0.0–0.1)
Basophil %: 0.6 %
Eosinophil #: 0.8 10*3/uL — ABNORMAL HIGH (ref 0.0–0.7)
Eosinophil %: 5.3 %
HCT: 24.6 % — ABNORMAL LOW (ref 40.0–52.0)
HGB: 8 g/dL — AB (ref 13.0–18.0)
LYMPHS PCT: 14 %
Lymphocyte #: 2.1 10*3/uL (ref 1.0–3.6)
MCH: 23.3 pg — AB (ref 26.0–34.0)
MCHC: 32.6 g/dL (ref 32.0–36.0)
MCV: 72 fL — ABNORMAL LOW (ref 80–100)
Monocyte #: 1.8 x10 3/mm — ABNORMAL HIGH (ref 0.2–1.0)
Monocyte %: 11.8 %
Neutrophil #: 10.3 10*3/uL — ABNORMAL HIGH (ref 1.4–6.5)
Neutrophil %: 68.3 %
PLATELETS: 347 10*3/uL (ref 150–440)
RBC: 3.44 10*6/uL — ABNORMAL LOW (ref 4.40–5.90)
RDW: 16.1 % — ABNORMAL HIGH (ref 11.5–14.5)
WBC: 15.2 10*3/uL — AB (ref 3.8–10.6)

## 2013-09-27 LAB — BASIC METABOLIC PANEL
Anion Gap: 12 (ref 7–16)
BUN: 84 mg/dL — ABNORMAL HIGH (ref 7–18)
CALCIUM: 8.4 mg/dL — AB (ref 8.5–10.1)
CO2: 22 mmol/L (ref 21–32)
Chloride: 88 mmol/L — ABNORMAL LOW (ref 98–107)
Creatinine: 5.51 mg/dL — ABNORMAL HIGH (ref 0.60–1.30)
EGFR (African American): 12 — ABNORMAL LOW
GFR CALC NON AF AMER: 11 — AB
Glucose: 249 mg/dL — ABNORMAL HIGH (ref 65–99)
OSMOLALITY: 280 (ref 275–301)
Potassium: 3.6 mmol/L (ref 3.5–5.1)
Sodium: 122 mmol/L — ABNORMAL LOW (ref 136–145)

## 2013-09-27 LAB — CULTURE, BLOOD (SINGLE)

## 2013-09-28 LAB — BASIC METABOLIC PANEL
ANION GAP: 11 (ref 7–16)
BUN: 84 mg/dL — ABNORMAL HIGH (ref 7–18)
CHLORIDE: 91 mmol/L — AB (ref 98–107)
CREATININE: 4.63 mg/dL — AB (ref 0.60–1.30)
Calcium, Total: 8.5 mg/dL (ref 8.5–10.1)
Co2: 25 mmol/L (ref 21–32)
EGFR (African American): 15 — ABNORMAL LOW
EGFR (Non-African Amer.): 13 — ABNORMAL LOW
Glucose: 90 mg/dL (ref 65–99)
Osmolality: 280 (ref 275–301)
Potassium: 3.2 mmol/L — ABNORMAL LOW (ref 3.5–5.1)
Sodium: 127 mmol/L — ABNORMAL LOW (ref 136–145)

## 2013-09-28 LAB — MAGNESIUM: Magnesium: 1.9 mg/dL

## 2013-09-28 LAB — CBC WITH DIFFERENTIAL/PLATELET
BASOS ABS: 0.2 10*3/uL — AB (ref 0.0–0.1)
Basophil %: 1.1 %
Eosinophil #: 1.3 10*3/uL — ABNORMAL HIGH (ref 0.0–0.7)
Eosinophil %: 8.6 %
HCT: 24.7 % — ABNORMAL LOW (ref 40.0–52.0)
HGB: 8.1 g/dL — ABNORMAL LOW (ref 13.0–18.0)
Lymphocyte #: 2.4 10*3/uL (ref 1.0–3.6)
Lymphocyte %: 16 %
MCH: 23.5 pg — ABNORMAL LOW (ref 26.0–34.0)
MCHC: 32.8 g/dL (ref 32.0–36.0)
MCV: 72 fL — AB (ref 80–100)
MONOS PCT: 7.1 %
Monocyte #: 1 x10 3/mm (ref 0.2–1.0)
NEUTROS PCT: 67.2 %
Neutrophil #: 9.9 10*3/uL — ABNORMAL HIGH (ref 1.4–6.5)
Platelet: 358 10*3/uL (ref 150–440)
RBC: 3.45 10*6/uL — AB (ref 4.40–5.90)
RDW: 15.8 % — ABNORMAL HIGH (ref 11.5–14.5)
WBC: 14.7 10*3/uL — AB (ref 3.8–10.6)

## 2013-09-28 LAB — POTASSIUM: Potassium: 3.3 mmol/L — ABNORMAL LOW (ref 3.5–5.1)

## 2013-09-29 ENCOUNTER — Ambulatory Visit (HOSPITAL_COMMUNITY)
Admission: AD | Admit: 2013-09-29 | Discharge: 2013-09-29 | Disposition: A | Discharge status: Home / Self Care | Payer: Medicare Other | Source: Other Acute Inpatient Hospital | Attending: Internal Medicine | Admitting: Internal Medicine

## 2013-09-29 ENCOUNTER — Other Ambulatory Visit (HOSPITAL_COMMUNITY): Payer: Medicare Other

## 2013-09-29 ENCOUNTER — Inpatient Hospital Stay
Admission: AD | Admit: 2013-09-29 | Discharge: 2013-11-04 | Disposition: A | Discharge status: Skilled Nursing Facility | Payer: Medicare Other | Source: Ambulatory Visit | Attending: Internal Medicine | Admitting: Internal Medicine

## 2013-09-29 DIAGNOSIS — J189 Pneumonia, unspecified organism: Secondary | ICD-10-CM | POA: Insufficient documentation

## 2013-09-29 DIAGNOSIS — Z2239 Carrier of other specified bacterial diseases: Secondary | ICD-10-CM

## 2013-09-29 DIAGNOSIS — Z9911 Dependence on respirator [ventilator] status: Secondary | ICD-10-CM | POA: Insufficient documentation

## 2013-09-29 DIAGNOSIS — J9600 Acute respiratory failure, unspecified whether with hypoxia or hypercapnia: Secondary | ICD-10-CM | POA: Insufficient documentation

## 2013-09-29 DIAGNOSIS — I639 Cerebral infarction, unspecified: Secondary | ICD-10-CM

## 2013-09-29 DIAGNOSIS — A419 Sepsis, unspecified organism: Secondary | ICD-10-CM

## 2013-09-29 DIAGNOSIS — R1310 Dysphagia, unspecified: Secondary | ICD-10-CM

## 2013-09-29 DIAGNOSIS — E119 Type 2 diabetes mellitus without complications: Secondary | ICD-10-CM

## 2013-09-29 DIAGNOSIS — Z930 Tracheostomy status: Secondary | ICD-10-CM

## 2013-09-29 DIAGNOSIS — N179 Acute kidney failure, unspecified: Secondary | ICD-10-CM

## 2013-09-29 DIAGNOSIS — I739 Peripheral vascular disease, unspecified: Secondary | ICD-10-CM

## 2013-09-29 DIAGNOSIS — J690 Pneumonitis due to inhalation of food and vomit: Secondary | ICD-10-CM

## 2013-09-29 LAB — BASIC METABOLIC PANEL
Anion Gap: 11 (ref 7–16)
BUN: 83 mg/dL — ABNORMAL HIGH (ref 7–18)
CALCIUM: 7.6 mg/dL — AB (ref 8.5–10.1)
CHLORIDE: 89 mmol/L — AB (ref 98–107)
Co2: 25 mmol/L (ref 21–32)
Creatinine: 3.93 mg/dL — ABNORMAL HIGH (ref 0.60–1.30)
EGFR (African American): 19 — ABNORMAL LOW
EGFR (Non-African Amer.): 16 — ABNORMAL LOW
Glucose: 224 mg/dL — ABNORMAL HIGH (ref 65–99)
Osmolality: 284 (ref 275–301)
Potassium: 3.8 mmol/L (ref 3.5–5.1)
Sodium: 125 mmol/L — ABNORMAL LOW (ref 136–145)

## 2013-09-29 LAB — CBC WITH DIFFERENTIAL/PLATELET
Basophil #: 0.2 10*3/uL — ABNORMAL HIGH (ref 0.0–0.1)
Basophil %: 1.1 %
Eosinophil #: 1.3 10*3/uL — ABNORMAL HIGH (ref 0.0–0.7)
Eosinophil %: 8.7 %
HCT: 26.7 % — ABNORMAL LOW (ref 40.0–52.0)
HGB: 8.5 g/dL — AB (ref 13.0–18.0)
Lymphocyte #: 1.8 10*3/uL (ref 1.0–3.6)
Lymphocyte %: 12 %
MCH: 23.1 pg — AB (ref 26.0–34.0)
MCHC: 32 g/dL (ref 32.0–36.0)
MCV: 72 fL — ABNORMAL LOW (ref 80–100)
Monocyte #: 0.6 x10 3/mm (ref 0.2–1.0)
Monocyte %: 4.1 %
NEUTROS ABS: 11.4 10*3/uL — AB (ref 1.4–6.5)
NEUTROS PCT: 74.1 %
PLATELETS: 411 10*3/uL (ref 150–440)
RBC: 3.7 10*6/uL — ABNORMAL LOW (ref 4.40–5.90)
RDW: 16.3 % — ABNORMAL HIGH (ref 11.5–14.5)
WBC: 15.3 10*3/uL — ABNORMAL HIGH (ref 3.8–10.6)

## 2013-09-29 LAB — POTASSIUM: Potassium: 4 mmol/L (ref 3.5–5.1)

## 2013-09-29 LAB — URINE CULTURE

## 2013-09-30 ENCOUNTER — Other Ambulatory Visit (HOSPITAL_COMMUNITY): Payer: Medicare Other

## 2013-09-30 DIAGNOSIS — Z2239 Carrier of other specified bacterial diseases: Secondary | ICD-10-CM

## 2013-09-30 DIAGNOSIS — J690 Pneumonitis due to inhalation of food and vomit: Secondary | ICD-10-CM

## 2013-09-30 DIAGNOSIS — A419 Sepsis, unspecified organism: Secondary | ICD-10-CM

## 2013-09-30 DIAGNOSIS — J9600 Acute respiratory failure, unspecified whether with hypoxia or hypercapnia: Secondary | ICD-10-CM

## 2013-09-30 DIAGNOSIS — E119 Type 2 diabetes mellitus without complications: Secondary | ICD-10-CM

## 2013-09-30 DIAGNOSIS — I639 Cerebral infarction, unspecified: Secondary | ICD-10-CM

## 2013-09-30 DIAGNOSIS — N179 Acute kidney failure, unspecified: Secondary | ICD-10-CM

## 2013-09-30 DIAGNOSIS — I739 Peripheral vascular disease, unspecified: Secondary | ICD-10-CM

## 2013-09-30 DIAGNOSIS — R1310 Dysphagia, unspecified: Secondary | ICD-10-CM

## 2013-09-30 LAB — BLOOD GAS, ARTERIAL
ACID-BASE DEFICIT: 2.7 mmol/L — AB (ref 0.0–2.0)
Bicarbonate: 21.6 mEq/L (ref 20.0–24.0)
FIO2: 0.4 %
MECHVT: 550 mL
O2 Saturation: 99.2 %
PCO2 ART: 36.6 mmHg (ref 35.0–45.0)
PEEP/CPAP: 5 cmH2O
Patient temperature: 98
RATE: 14 resp/min
TCO2: 22.7 mmol/L (ref 0–100)
pH, Arterial: 7.386 (ref 7.350–7.450)
pO2, Arterial: 117 mmHg — ABNORMAL HIGH (ref 80.0–100.0)

## 2013-09-30 LAB — CULTURE, BLOOD (SINGLE)

## 2013-09-30 LAB — COMPREHENSIVE METABOLIC PANEL
ALT: 13 U/L (ref 0–53)
AST: 20 U/L (ref 0–37)
Albumin: 1.6 g/dL — ABNORMAL LOW (ref 3.5–5.2)
Alkaline Phosphatase: 238 U/L — ABNORMAL HIGH (ref 39–117)
BUN: 87 mg/dL — ABNORMAL HIGH (ref 6–23)
CO2: 21 meq/L (ref 19–32)
Calcium: 7.8 mg/dL — ABNORMAL LOW (ref 8.4–10.5)
Chloride: 87 mEq/L — ABNORMAL LOW (ref 96–112)
Creatinine, Ser: 2.84 mg/dL — ABNORMAL HIGH (ref 0.50–1.35)
GFR calc non Af Amer: 23 mL/min — ABNORMAL LOW (ref >90)
GFR, EST AFRICAN AMERICAN: 27 mL/min — AB (ref >90)
GLUCOSE: 199 mg/dL — AB (ref 70–99)
Potassium: 5 mEq/L (ref 3.7–5.3)
SODIUM: 126 meq/L — AB (ref 137–147)
Total Bilirubin: 0.3 mg/dL (ref 0.3–1.2)
Total Protein: 6.5 g/dL (ref 6.0–8.3)

## 2013-09-30 LAB — CBC
HCT: 23.7 % — ABNORMAL LOW (ref 39.0–52.0)
Hemoglobin: 8 g/dL — ABNORMAL LOW (ref 13.0–17.0)
MCH: 23.6 pg — ABNORMAL LOW (ref 26.0–34.0)
MCHC: 33.8 g/dL (ref 30.0–36.0)
MCV: 69.9 fL — AB (ref 78.0–100.0)
Platelets: 413 10*3/uL — ABNORMAL HIGH (ref 150–400)
RBC: 3.39 MIL/uL — AB (ref 4.22–5.81)
RDW: 15.9 % — ABNORMAL HIGH (ref 11.5–15.5)
WBC: 16.2 10*3/uL — ABNORMAL HIGH (ref 4.0–10.5)

## 2013-09-30 LAB — URINALYSIS, ROUTINE W REFLEX MICROSCOPIC
Bilirubin Urine: NEGATIVE
Glucose, UA: NEGATIVE mg/dL
Hgb urine dipstick: NEGATIVE
KETONES UR: NEGATIVE mg/dL
NITRITE: NEGATIVE
Protein, ur: 30 mg/dL — AB
Specific Gravity, Urine: 1.014 (ref 1.005–1.030)
Urobilinogen, UA: 0.2 mg/dL (ref 0.0–1.0)
pH: 5 (ref 5.0–8.0)

## 2013-09-30 LAB — URINE MICROSCOPIC-ADD ON

## 2013-09-30 LAB — PREALBUMIN: Prealbumin: 10.7 mg/dL — ABNORMAL LOW (ref 17.0–34.0)

## 2013-09-30 LAB — PROCALCITONIN: PROCALCITONIN: 5.06 ng/mL

## 2013-09-30 LAB — TSH: TSH: 2.903 u[IU]/mL (ref 0.350–4.500)

## 2013-09-30 NOTE — Consult Note (Signed)
PULMONARY  / CRITICAL CARE MEDICINE  Name: Robert BushyDouglas J [name] MRN: [id number] DOB: [birth year]  CONSULTATION DATE: 09/30/2013  BRIEF PATIENT DESCRIPTION: 58 yo with h/o CVA, dysphagia, aspiration pneumonia and multiple prior intubations [facility]  CULTURES: No results found for this or any previous visit (from the past 720 hour(s)).  ANTIBIOTICS:  The patient is encephalopathic and unable to provide history, which was obtained for available medical records.  HISTORY OF PRESENT ILLNESS:  58 yo with h/o CVA, dysphagia, aspiration pneumonia and multiple prior intubations [facility]  PAST MEDICAL HISTORY :  Acute respiratory failure Sepsis VRE (vancomycin resistant enterococcus) culture positive Aspiration pneumonia Acute renal failure Diabetes CVA (cerebral infarction) PVD (peripheral vascular disease) Dysphagia  FAMILY HISTORY:  Unable to provide  SOCIAL HISTORY:  has no tobacco, alcohol, and drug history on file.  REVIEW OF SYSTEMS:   Unable to provide  INTERVAL HISTORY:   PHYSICAL EXAMINATION:  Vital signs: Reviewed General:  Appears chronically ill, mechanically ventilated, synchronous Neuro:  Encephalopathic, nonfocal, cough / gag diminished, sedated HEENT:  PERRL, orally intubated Cardiovascular:  RRR, no m/r/g Lungs:  Bilateral coarse rhonchi Abdomen:  Soft, nontender, bowel sounds diminished Musculoskeletal:  No edema Skin:  No rash  LABS:  Recent Labs Lab 09/30/13 0500  NA 126*  K 5.0  CL 87*  CO2 21  BUN 87*  CREATININE 2.84*  GLUCOSE 199*    Recent Labs Lab 09/30/13 0500  HGB 8.0*  HCT 23.7*  WBC 16.2*  PLT 413*    IMAGING: Dg Chest Port 1 View  09/30/2013   CLINICAL DATA:  Respiratory failure  EXAM: PORTABLE CHEST - 1 VIEW  COMPARISON:  Portable exam 0629 hr compared to 09/25/2013  FINDINGS: Tip of right arm PICC line projects over confluence of the right brachiocephalic vein.  Tip of  endotracheal tube projects 4.9 cm above carina.  Stable heart size.  Right-sided aortic arch.  Bilateral pulmonary infiltrates identified.  Predominately interstitial infiltrates in left lung slightly increased.  Mixed alveolar and interstitial infiltrates in right lung with slightly improved aeration right upper lobe versus previous exam.  Atelectasis left base, cannot exclude small fusion.  No pneumothorax.  Bones demineralized.  IMPRESSION: Bilateral pulmonary infiltrates, slightly increased on left and slightly improved in right upper lobe versus previous exam.   Electronically Signed   By: Ulyses SouthwardMark  Boles M.D.   On: 09/30/2013 08:04   Dg Abd Portable 1v  09/29/2013   CLINICAL DATA:  Verifying PEG placement  EXAM: PORTABLE ABDOMEN - 1 VIEW  COMPARISON:  DG ABDOMEN 1V dated 09/26/2013  FINDINGS: There is a percutaneous gastrostomy with bulb projecting over the gastric antral region.  IMPRESSION: Percutaneous gastrostomy tube appears in good position on single-view.   Electronically Signed   By: Genevive BiStewart  Edmunds M.D.   On: 09/29/2013 22:53   ASSESSMENT: Acute on chronic respiratory failure Acute on chronic renal failure Acute encephalopathy Aspiration pneumonia UTI with VRE SIRS / sepisi  PLAN: Goal pH>7.30, SpO2>92 Continuous mechanical support Would obtain consent for tracheostomy Trend ABG/CXR VAP / GI Px Would use agents other than propofol for sedation as hypotensive Goal MAP>60 Trend lactate Titrate Levophed gtt to off Antibiotics per primary team  Brett CanalesSteve Minor ACNP [facility] Pager [phone] till 3 pm If no answer page [phone] 09/30/2013, 10:53 AM  I have personally obtained history, examined patient, evaluated and interpreted laboratory and imaging results, reviewed medical records, formulated assessment / plan  and placed orders.  CRITICAL CARE:  The patient is critically ill with multiple organ systems failure and requires high complexity decision making for assessment and  support, frequent evaluation and titration of therapies, application of advanced monitoring technologies and extensive interpretation of multiple databases. Critical Care Time devoted to patient care services described in this note is 35 minutes.   Lonia Farber, MD Pulmonary and Critical Care Medicine [facility] Pager: [phone]  09/30/2013, 12:09 PM

## 2013-10-01 ENCOUNTER — Other Ambulatory Visit (HOSPITAL_COMMUNITY): Payer: Medicare Other

## 2013-10-01 ENCOUNTER — Encounter (HOSPITAL_COMMUNITY): Payer: Medicare Other

## 2013-10-01 DIAGNOSIS — N179 Acute kidney failure, unspecified: Secondary | ICD-10-CM

## 2013-10-01 LAB — BASIC METABOLIC PANEL
BUN: 87 mg/dL — ABNORMAL HIGH (ref 6–23)
CO2: 23 meq/L (ref 19–32)
Calcium: 7.7 mg/dL — ABNORMAL LOW (ref 8.4–10.5)
Chloride: 90 mEq/L — ABNORMAL LOW (ref 96–112)
Creatinine, Ser: 2.4 mg/dL — ABNORMAL HIGH (ref 0.50–1.35)
GFR calc non Af Amer: 29 mL/min — ABNORMAL LOW (ref >90)
GFR, EST AFRICAN AMERICAN: 33 mL/min — AB (ref >90)
Glucose, Bld: 360 mg/dL — ABNORMAL HIGH (ref 70–99)
POTASSIUM: 4.2 meq/L (ref 3.7–5.3)
Sodium: 125 mEq/L — ABNORMAL LOW (ref 137–147)

## 2013-10-01 LAB — LACTIC ACID, PLASMA: Lactic Acid, Venous: 2.5 mmol/L — ABNORMAL HIGH (ref 0.5–2.2)

## 2013-10-01 NOTE — Procedures (Signed)
Bronchoscopy Procedure Note Robert BushyDouglas J [name] [id number] 05-06-1956  Procedure: Bronchoscopy Indications: Diagnostic evaluation of the airways  Procedure Details Consent: Risks of procedure as well as the alternatives and risks of each were explained to the (patient/caregiver).  Consent for procedure obtained. Time Out: Verified patient identification, verified procedure, site/side was marked, verified correct patient position, special equipment/implants available, medications/allergies/relevent history reviewed, required imaging and test results available.  Performed  In preparation for procedure, patient was given 100% FiO2 and bronchoscope lubricated. Sedation: Etomidate  Airway entered and the following bronchi were examined: RUL, RML, RLL, LUL, LLL and Bronchi.   Procedures performed: Brushings performed  None  Placed scope, backed up ett to 17 cm, observed entire procedure, needle, wire, punch dil, progresive di and placement 8 shiley trach. No posterior wall injury noted. Then placed bronch through trach, carina 6 cm below , no bleeding Trachea was turtuious  Bronchoscope removed.  , Patient placed back on 100% FiO2 at conclusion of procedure.   BAL pus white left main, suctioned clear.   Evaluation Hemodynamic Status: BP stable throughout; O2 sats: stable throughout Patient's Current Condition: stable Specimens:  Sent purulent fluid Complications: No apparent complications Patient did tolerate procedure well.   Nelda BucksFEINSTEIN,Robert Schuenemann J. 10/01/2013

## 2013-10-01 NOTE — Procedures (Signed)
Bedside Tracheostomy Insertion Procedure Note   Patient Details:   Name: Robert [name] DOB: [birth year] MRN: [id number]  Procedure: Tracheostomy  Pre Procedure Assessment: ET Tube Size:7.5 ET Tube secured at lip (cm):23 Bite block in place: No, paralytic used Breath Sounds: Diminished  Post Procedure Assessment: There were no vitals taken for this visit. O2 sats: stable throughout Complications: No apparent complications Patient did tolerate procedure well Tracheostomy Brand:Shiley Tracheostomy Style:Cuffed Tracheostomy Size: 8.0 Tracheostomy Secured ZDG:LOVFIEPvia:Sutures Tracheostomy Placement Confirmation:Trach cuff visualized and in place and Chest X ray ordered for placement    Leonard DowningFerguson, Kourtnie Sachs Steele 10/01/2013, 2:55 PM

## 2013-10-01 NOTE — Procedures (Signed)
Name:  Robert [name] MRN:  [id number] DOB:  [birth year]  OPERATIVE NOTE  Procedure:  Percutaneous tracheostomy.  Indications:  Ventilator-dependent respiratory failure.  Consent:  Procedure, alternatives, risks and benefits discussed with medical POA.  Questions answered.  Consent obtained.  Anesthesia:  Fentanyl / Versed / etomidate / Local.  Procedure summary:  Appropriate equipment was assembled.  The patient was identified as Robert [name] and safety timeout was performed. The patient was placed in supine position with a towel roll behind shoulder blades and neck extended.  Sterile technique was used. The patient's neck and upper chest were prepped using chlorhexidine / alcohol scrub and the field was draped in usual sterile fashion with full body drape. After the adequate sedation / anesthesia was achieved, attention was directed at the midline trachea, where the cricothyroid membrane was palpated. Approximately two fingerbreadths above the sternal notch, a horizontal incision was created with a scalpel after local infiltration with 0.2% Lidocaine. Then, using Seldinger technique and a percutaneous tracheostomy set, the trachea was entered with a 14 gauge needle with an overlying sheath. This was all confirmed under direct visualization of a fiberoptic flexible bronchoscope. Entrance into the trachea was identified through the third tracheal ring interspace. Following this, a guidewire was inserted. The needle was removed, leaving the sheath and the guidewire intact. Next, the sheath was removed and a small dilator was inserted. The tracheal rings were then dilated. A #8 Shiley was then opened. The balloon was checked. It was placed over a tracheal dilator, which was then advanced over the guidewire and through the previously dilated tract. The Shiley tracheostomy tube was noted to pass in the trachea with little resistance. The guidewire and dilator tubes were removed from the trachea. An inner  cannula was placed through the tracheostomy tube. The tracheostomy was then secured at the anterior neck with 4 monofilament sutures. The oral endotracheal tube was removed and the ventilator was attached to the newly placed tracheostomy tube. Adequate tidal volumes were noted. The cuff was inflated and no evidence of air leak was noted. No evidence of bleeding was noted. At this point, the procedure was concluded. Post-procedure chest x-ray was ordered.  Complications:  No immediate complications were noted.  Hemodynamic parameters and oxygenation remained stable throughout the procedure.  Estimated blood loss:  Less then 15 mL.  Lonia FarberZUBELEVITSKIY, Marlee Trentman, MD Pulmonary and Critical Care Medicine [facility] Pager: [phone]  10/01/2013, 2:50 PM

## 2013-10-01 NOTE — Progress Notes (Signed)
PULMONARY  / CRITICAL CARE MEDICINE  Name: Robert BushyDouglas J [name] MRN: [id number] DOB: 1955/10/16  CONSULTATION DATE: 09/30/2013  BRIEF PATIENT DESCRIPTION: 58 yo with h/o CVA, dysphagia, aspiration pneumonia and multiple prior int[facility]ations [facility]  CULTURES: No results found for this or any previous visit (from the past 720 hour(s)).  ANTIBIOTICS: Zyvox 2/13 >>> Meropenem 2/10 >>> Micafungin 2/15 >>>  INTERVAL HISTORY: Minimally responsive.  On minimal vasopressors but hypotensive when off pressors.  PHYSICAL EXAMINATION:  Vital signs: Vital signs reviewed. Abnormal values will appear under impression plan section.  General:  Appears chronically ill, mechanically ventilated, synchronous Neuro:  Encephalopathic, nonfocal, cough / gag diminished, sedated HEENT:  PERRL, orally intubated Cardiovascular:  RRR, no m/r/g Lungs:  Bilateral coarse rhonchi Abdomen:  Soft, nontender, bowel sounds diminished, flexiseal in place Musculoskeletal:  No edema Skin:  No rash  LABS:  Recent Labs Lab 09/30/13 0500 10/01/13 0500  NA 126* 125*  K 5.0 4.2  CL 87* 90*  CO2 21 23  BUN 87* 87*  CREATININE 2.84* 2.40*  GLUCOSE 199* 360*    Recent Labs Lab 09/30/13 0500  HGB 8.0*  HCT 23.7*  WBC 16.2*  PLT 413*    IMAGING: Dg Chest Port 1 View  10/01/2013   CLINICAL DATA:  Hypoxia  EXAM: PORTABLE CHEST - 1 VIEW  COMPARISON:  September 30, 2013  FINDINGS: Endotracheal tube tip is 4.0 cm above the carina. Central catheter tip is at the junction of the right innominate vein and superior vena cava, stable. No pneumothorax.  There is extensive interstitial edema bilaterally with patchy alveolar opacity in the right mid and lower lung zones. There has been some clearing of patchy infiltrate in the left base compared to 1 day prior. Changes on the right are essentially stable.  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 Heart size and pulmonary vascularity are normal.  IMPRESSION:  Tube and catheter positions as described without pneumothorax. Extensive interstitial and alveolar opacity with some clearing in the left base compared to 1 day prior. No change on the right. Question ARDS with possible superimposed congestive heart failure and/or pneumonia. Right-sided aortic arch noted.   Electronically Signed   By: Bretta BangWilliam  Woodruff M.D.   On: 10/01/2013 07:42   Dg Chest Port 1 View  09/30/2013   CLINICAL DATA:  Respiratory failure  EXAM: PORTABLE CHEST - 1 VIEW  COMPARISON:  Portable exam 0629 hr compared to 09/25/2013  FINDINGS: Tip of right arm PICC line projects over confluence of the right brachiocephalic vein.  Tip of endotracheal tube projects 4.9 cm above carina.  Stable heart size.  Right-sided aortic arch.  Bilateral pulmonary infiltrates identified.  Predominately interstitial infiltrates in left lung slightly increased.  Mixed alveolar and interstitial infiltrates in right lung with slightly improved aeration right upper lobe versus previous exam.  Atelectasis left base, cannot exclude small fusion.  No pneumothorax.  Bones demineralized.  IMPRESSION: Bilateral pulmonary infiltrates, slightly increased on left and slightly improved in right upper lobe versus previous exam.   Electronically Signed   By: Ulyses SouthwardMark  Boles M.D.   On: 09/30/2013 08:04   Dg Abd Portable 1v  09/29/2013   CLINICAL DATA:  Verifying PEG placement  EXAM: PORTABLE ABDOMEN - 1 VIEW  COMPARISON:  DG ABDOMEN 1V dated 09/26/2013  FINDINGS: There is a percutaneous gastrostomy with bulb projecting over the gastric antral region.  IMPRESSION: Percutaneous gastrostomy tube appears in good position on single-view.   Electronically Signed  By: Genevive Bi M.D.   On: 09/29/2013 22:53   ASSESSMENT: Acute on chronic respiratory failure Acute on chronic renal failure Acute encephalopathy Aspiration pneumonia UTI with VRE SIRS / sepisi  PLAN: Goal pH>7.30, SpO2>92 Continuous mechanical support Tracheostomy  tentatively 2/19 Trend ABG/CXR VAP / GI Px D/c Propofol Start Fentanyl / Versed PRN Goal MAP>60  NS bolus 1000 x 1 D/c Levophed Trend lactate Antibiotics per primary team  Brett Canales Minor ACNP [facility] Pager [phone] till 3 pm If no answer page [phone] 10/01/2013, 10:05 AM  I have personally obtained history, examined patient, evaluated and interpreted laboratory and imaging results, reviewed medical records, formulated assessment / plan and placed orders.  CRITICAL CARE:  The patient is critically ill with multiple organ systems failure and requires high complexity decision making for assessment and support, frequent evaluation and titration of therapies, application of advanced monitoring technologies and extensive interpretation of multiple databases. Critical Care Time devoted to patient care services described in this note is 30 minutes.   Lonia Farber, MD Pulmonary and Critical Care Medicine [facility] Pager: [phone]  10/01/2013, 11:12 AM

## 2013-10-02 ENCOUNTER — Encounter (HOSPITAL_COMMUNITY): Payer: Self-pay

## 2013-10-02 ENCOUNTER — Other Ambulatory Visit (HOSPITAL_COMMUNITY): Payer: Medicaid Other

## 2013-10-02 LAB — CBC
HCT: 23.9 % — ABNORMAL LOW (ref 39.0–52.0)
HEMOGLOBIN: 7.9 g/dL — AB (ref 13.0–17.0)
MCH: 23.6 pg — ABNORMAL LOW (ref 26.0–34.0)
MCHC: 33.1 g/dL (ref 30.0–36.0)
MCV: 71.3 fL — ABNORMAL LOW (ref 78.0–100.0)
Platelets: 388 10*3/uL (ref 150–400)
RBC: 3.35 MIL/uL — ABNORMAL LOW (ref 4.22–5.81)
RDW: 16 % — ABNORMAL HIGH (ref 11.5–15.5)
WBC: 9.9 10*3/uL (ref 4.0–10.5)

## 2013-10-02 LAB — BASIC METABOLIC PANEL
BUN: 82 mg/dL — AB (ref 6–23)
CO2: 22 mEq/L (ref 19–32)
Calcium: 7.6 mg/dL — ABNORMAL LOW (ref 8.4–10.5)
Chloride: 99 mEq/L (ref 96–112)
Creatinine, Ser: 1.8 mg/dL — ABNORMAL HIGH (ref 0.50–1.35)
GFR, EST AFRICAN AMERICAN: 47 mL/min — AB (ref >90)
GFR, EST NON AFRICAN AMERICAN: 41 mL/min — AB (ref >90)
Glucose, Bld: 119 mg/dL — ABNORMAL HIGH (ref 70–99)
Potassium: 3.7 mEq/L (ref 3.7–5.3)
Sodium: 134 mEq/L — ABNORMAL LOW (ref 137–147)

## 2013-10-03 ENCOUNTER — Other Ambulatory Visit (HOSPITAL_COMMUNITY): Payer: Medicare Other

## 2013-10-03 MED ORDER — FENTANYL CITRATE 0.05 MG/ML IJ SOLN
INTRAMUSCULAR | Status: AC
  Filled 2013-10-03: qty 2

## 2013-10-03 MED ORDER — IOHEXOL 300 MG/ML  SOLN
50.0000 mL | Freq: Once | INTRAMUSCULAR | Status: AC | PRN
  Administered 2013-10-03: 20 mL via INTRAVENOUS

## 2013-10-03 NOTE — Procedures (Signed)
GJ conversion

## 2013-10-03 NOTE — Consult Note (Signed)
PULMONARY  / CRITICAL CARE MEDICINE  Name: Grace BushyDouglas J Macioce MRN: [id number] DOB: Sep 17, 1955  CONSULTATION DATE: 09/30/2013  BRIEF PATIENT DESCRIPTION: 58 yo with h/o CVA, dysphagia, aspiration pneumonia and multiple prior intubations [facility]  CULTURES: Recent Results (from the past 720 hour(s))  CULTURE, BAL-QUANTITATIVE     Status: None   Collection Time    10/01/13  2:45 PM      Result Value Ref Range Status   Specimen Description BRONCHIAL ALVEOLAR LAVAGE   Final   Special Requests Normal   Final   Gram Stain     Final   Value: ABUNDANT WBC PRESENT,BOTH PMN AND MONONUCLEAR     NO SQUAMOUS EPITHELIAL CELLS SEEN     NO ORGANISMS SEEN     Performed at [facility]   Colony Count PENDING   Incomplete   Culture     Final   Value: NO GROWTH 1 DAY     [facility]formed at [facility]   Report Status PENDING   Incomplete    ANTIBIOTICS:  Per ssh  HISTORY OF PRESENT ILLNESS:  58 yo with h/o CVA, dysphagia, aspiration pneumonia and multiple prior intubations [facility] Vital signs: Reviewed  General:  Appears chronically ill, on t collar 28 % Neuro:  Encephalopathic, nonfocal, cough / gag diminished,no follows commands HEENT:  PERRL, orally intubated Cardiovascular:  RRR, no m/r/g Lungs:  Bilateral diminished bs bases Abdomen:  Soft, nontender, bowel sounds diminished Musculoskeletal:  No edema Skin:  No rash  LABS:  Recent Labs Lab 09/30/13 0500 10/01/13 0500 10/02/13 0500  NA 126* 125* 134*  K 5.0 4.2 3.7  CL 87* 90* 99  CO2 21 23 22   BUN 87* 87* 82*  CREATININE 2.84* 2.40* 1.80*  GLUCOSE 199* 360* 119*    Recent Labs Lab 09/30/13 0500 10/02/13 0500  HGB 8.0* 7.9*  HCT 23.7* 23.9*  WBC 16.2* 9.9  PLT 413* 388   Vital signs reviewed. Abnormal values will appear under impression plan section.   IMAGING: Dg Chest Port 1 View  10/03/2013   CLINICAL DATA:  Endotracheal tube.  Ventilator.   EXAM: PORTABLE CHEST - 1 VIEW  COMPARISON:  10/01/2013  FINDINGS: Endotracheal tube has removed in removed. Tracheostomy tube has been placed and overlies the airway. Right subclavian approach central venous catheter remains in place with tip overlying the upper SVC. The cardiomediastinal silhouette is unchanged with a right-sided aortic arch noted. Diffuse bilateral interstitial opacities are again seen. Patchy airspace opacities in the right mid and lower lung and to a lesser extent left mid and lower lung have mildly worsened since the prior study.  IMPRESSION: Mild interval worsening of right greater than left airspace disease.   Electronically Signed   By: Sebastian AcheAllen  Grady   On: 10/03/2013 07:54   ASSESSMENT: Acute on chronic respiratory failure Acute on chronic renal failure Acute encephalopathy Aspiration pneumonia UTI with VRE SIRS / sepisi  PLAN: Goal pH>7.30, SpO2>92 Continuous mechanical support, wean per protocol. Note 2/20 on t collar x 3 hrs tracheostomy per KZ 2/18 Trend ABG/CXR VAP / GI Px Antibiotics per primary team  Brett CanalesSteve Minor ACNP [facility] Pager [phone] till 3 pm If no answer page [phone] 10/03/2013, 9:34 AM  Levy Pupaobert Harshal Sirmon, MD, PhD 10/03/2013, 11:45 AM [facility] [phone] or if no answer [phone]

## 2013-10-04 LAB — CULTURE, BAL-QUANTITATIVE W GRAM STAIN: Colony Count: 10000

## 2013-10-04 LAB — BASIC METABOLIC PANEL
BUN: 71 mg/dL — ABNORMAL HIGH (ref 6–23)
CHLORIDE: 105 meq/L (ref 96–112)
CO2: 22 meq/L (ref 19–32)
CREATININE: 1.45 mg/dL — AB (ref 0.50–1.35)
Calcium: 8 mg/dL — ABNORMAL LOW (ref 8.4–10.5)
GFR calc Af Amer: 61 mL/min — ABNORMAL LOW (ref >90)
GFR calc non Af Amer: 53 mL/min — ABNORMAL LOW (ref >90)
Glucose, Bld: 235 mg/dL — ABNORMAL HIGH (ref 70–99)
Potassium: 4.3 mEq/L (ref 3.7–5.3)
Sodium: 141 mEq/L (ref 137–147)

## 2013-10-04 LAB — CULTURE, BAL-QUANTITATIVE: Special Requests: NORMAL

## 2013-10-04 LAB — CBC
HCT: 24.2 % — ABNORMAL LOW (ref 39.0–52.0)
Hemoglobin: 7.8 g/dL — ABNORMAL LOW (ref 13.0–17.0)
MCH: 23.3 pg — ABNORMAL LOW (ref 26.0–34.0)
MCHC: 32.2 g/dL (ref 30.0–36.0)
MCV: 72.2 fL — AB (ref 78.0–100.0)
Platelets: 402 10*3/uL — ABNORMAL HIGH (ref 150–400)
RBC: 3.35 MIL/uL — AB (ref 4.22–5.81)
RDW: 16.2 % — ABNORMAL HIGH (ref 11.5–15.5)
WBC: 8.6 10*3/uL (ref 4.0–10.5)

## 2013-10-05 ENCOUNTER — Other Ambulatory Visit (HOSPITAL_COMMUNITY): Payer: Medicare Other

## 2013-10-05 LAB — CBC
HEMATOCRIT: 23.2 % — AB (ref 39.0–52.0)
Hemoglobin: 7.5 g/dL — ABNORMAL LOW (ref 13.0–17.0)
MCH: 23.5 pg — ABNORMAL LOW (ref 26.0–34.0)
MCHC: 32.3 g/dL (ref 30.0–36.0)
MCV: 72.7 fL — ABNORMAL LOW (ref 78.0–100.0)
Platelets: 417 10*3/uL — ABNORMAL HIGH (ref 150–400)
RBC: 3.19 MIL/uL — AB (ref 4.22–5.81)
RDW: 16.4 % — ABNORMAL HIGH (ref 11.5–15.5)
WBC: 17.1 10*3/uL — ABNORMAL HIGH (ref 4.0–10.5)

## 2013-10-05 LAB — BLOOD GAS, ARTERIAL
Acid-base deficit: 3.2 mmol/L — ABNORMAL HIGH (ref 0.0–2.0)
Bicarbonate: 20.5 mEq/L (ref 20.0–24.0)
FIO2: 0.28 %
O2 Saturation: 93.8 %
PEEP/CPAP: 5 cmH2O
Patient temperature: 99.4
Pressure support: 12 cmH2O
TCO2: 21.5 mmol/L (ref 0–100)
pCO2 arterial: 32.5 mmHg — ABNORMAL LOW (ref 35.0–45.0)
pH, Arterial: 7.419 (ref 7.350–7.450)
pO2, Arterial: 67.3 mmHg — ABNORMAL LOW (ref 80.0–100.0)

## 2013-10-06 ENCOUNTER — Other Ambulatory Visit (HOSPITAL_COMMUNITY): Payer: Medicare Other

## 2013-10-06 DIAGNOSIS — J9600 Acute respiratory failure, unspecified whether with hypoxia or hypercapnia: Secondary | ICD-10-CM

## 2013-10-06 LAB — BASIC METABOLIC PANEL
BUN: 55 mg/dL — ABNORMAL HIGH (ref 6–23)
CALCIUM: 8.3 mg/dL — AB (ref 8.4–10.5)
CO2: 23 mEq/L (ref 19–32)
CREATININE: 1.35 mg/dL (ref 0.50–1.35)
Chloride: 113 mEq/L — ABNORMAL HIGH (ref 96–112)
GFR calc Af Amer: 67 mL/min — ABNORMAL LOW (ref >90)
GFR, EST NON AFRICAN AMERICAN: 58 mL/min — AB (ref >90)
GLUCOSE: 140 mg/dL — AB (ref 70–99)
Potassium: 4.2 mEq/L (ref 3.7–5.3)
SODIUM: 148 meq/L — AB (ref 137–147)

## 2013-10-06 LAB — CBC
HCT: 17.8 % — ABNORMAL LOW (ref 39.0–52.0)
HCT: 21.2 % — ABNORMAL LOW (ref 39.0–52.0)
Hemoglobin: 5.7 g/dL — CL (ref 13.0–17.0)
Hemoglobin: 6.9 g/dL — CL (ref 13.0–17.0)
MCH: 23.5 pg — AB (ref 26.0–34.0)
MCH: 24.1 pg — ABNORMAL LOW (ref 26.0–34.0)
MCHC: 32 g/dL (ref 30.0–36.0)
MCHC: 32.5 g/dL (ref 30.0–36.0)
MCV: 73.3 fL — ABNORMAL LOW (ref 78.0–100.0)
MCV: 74.1 fL — ABNORMAL LOW (ref 78.0–100.0)
PLATELETS: 409 10*3/uL — AB (ref 150–400)
PLATELETS: 330 10*3/uL (ref 150–400)
RBC: 2.43 MIL/uL — AB (ref 4.22–5.81)
RBC: 2.86 MIL/uL — ABNORMAL LOW (ref 4.22–5.81)
RDW: 16.7 % — AB (ref 11.5–15.5)
RDW: 16.9 % — AB (ref 11.5–15.5)
WBC: 15.6 10*3/uL — ABNORMAL HIGH (ref 4.0–10.5)
WBC: 14.5 10*3/uL — ABNORMAL HIGH (ref 4.0–10.5)

## 2013-10-06 LAB — URINALYSIS, ROUTINE W REFLEX MICROSCOPIC
Bilirubin Urine: NEGATIVE
GLUCOSE, UA: NEGATIVE mg/dL
Hgb urine dipstick: NEGATIVE
KETONES UR: NEGATIVE mg/dL
LEUKOCYTES UA: NEGATIVE
Nitrite: NEGATIVE
Protein, ur: 100 mg/dL — AB
Specific Gravity, Urine: 1.02 (ref 1.005–1.030)
Urobilinogen, UA: 0.2 mg/dL (ref 0.0–1.0)
pH: 5 (ref 5.0–8.0)

## 2013-10-06 LAB — URINE MICROSCOPIC-ADD ON

## 2013-10-07 LAB — URINE CULTURE
COLONY COUNT: NO GROWTH
CULTURE: NO GROWTH

## 2013-10-07 LAB — BASIC METABOLIC PANEL
BUN: 50 mg/dL — ABNORMAL HIGH (ref 6–23)
CALCIUM: 8.3 mg/dL — AB (ref 8.4–10.5)
CO2: 25 mEq/L (ref 19–32)
Chloride: 113 mEq/L — ABNORMAL HIGH (ref 96–112)
Creatinine, Ser: 1.3 mg/dL (ref 0.50–1.35)
GFR calc Af Amer: 70 mL/min — ABNORMAL LOW (ref >90)
GFR, EST NON AFRICAN AMERICAN: 60 mL/min — AB (ref >90)
Glucose, Bld: 111 mg/dL — ABNORMAL HIGH (ref 70–99)
Potassium: 4.6 mEq/L (ref 3.7–5.3)
SODIUM: 149 meq/L — AB (ref 137–147)

## 2013-10-07 LAB — CBC
HCT: 22.1 % — ABNORMAL LOW (ref 39.0–52.0)
Hemoglobin: 7.2 g/dL — ABNORMAL LOW (ref 13.0–17.0)
MCH: 24.2 pg — AB (ref 26.0–34.0)
MCHC: 32.6 g/dL (ref 30.0–36.0)
MCV: 74.4 fL — ABNORMAL LOW (ref 78.0–100.0)
PLATELETS: 346 10*3/uL (ref 150–400)
RBC: 2.97 MIL/uL — AB (ref 4.22–5.81)
RDW: 17.1 % — AB (ref 11.5–15.5)
WBC: 13.7 10*3/uL — AB (ref 4.0–10.5)

## 2013-10-08 DIAGNOSIS — Z930 Tracheostomy status: Secondary | ICD-10-CM

## 2013-10-08 DIAGNOSIS — J690 Pneumonitis due to inhalation of food and vomit: Secondary | ICD-10-CM

## 2013-10-08 LAB — BASIC METABOLIC PANEL
BUN: 48 mg/dL — ABNORMAL HIGH (ref 6–23)
CALCIUM: 8.4 mg/dL (ref 8.4–10.5)
CHLORIDE: 112 meq/L (ref 96–112)
CO2: 25 mEq/L (ref 19–32)
Creatinine, Ser: 1.21 mg/dL (ref 0.50–1.35)
GFR calc non Af Amer: 66 mL/min — ABNORMAL LOW (ref >90)
GFR, EST AFRICAN AMERICAN: 76 mL/min — AB (ref >90)
Glucose, Bld: 207 mg/dL — ABNORMAL HIGH (ref 70–99)
Potassium: 4.8 mEq/L (ref 3.7–5.3)
Sodium: 148 mEq/L — ABNORMAL HIGH (ref 137–147)

## 2013-10-08 LAB — CBC
HCT: 20.9 % — ABNORMAL LOW (ref 39.0–52.0)
Hemoglobin: 6.7 g/dL — CL (ref 13.0–17.0)
MCH: 23.8 pg — ABNORMAL LOW (ref 26.0–34.0)
MCHC: 32.1 g/dL (ref 30.0–36.0)
MCV: 74.1 fL — ABNORMAL LOW (ref 78.0–100.0)
PLATELETS: 337 10*3/uL (ref 150–400)
RBC: 2.82 MIL/uL — ABNORMAL LOW (ref 4.22–5.81)
RDW: 17.1 % — AB (ref 11.5–15.5)
WBC: 13.3 10*3/uL — AB (ref 4.0–10.5)

## 2013-10-08 LAB — BLOOD GAS, ARTERIAL
Acid-Base Excess: 1.7 mmol/L (ref 0.0–2.0)
BICARBONATE: 25.9 meq/L — AB (ref 20.0–24.0)
FIO2: 0.4 %
O2 Saturation: 98.5 %
PCO2 ART: 41.9 mmHg (ref 35.0–45.0)
PEEP/CPAP: 5 cmH2O
PO2 ART: 106 mmHg — AB (ref 80.0–100.0)
Patient temperature: 98.6
Pressure support: 12 cmH2O
TCO2: 27.2 mmol/L (ref 0–100)
pH, Arterial: 7.408 (ref 7.350–7.450)

## 2013-10-08 LAB — CULTURE, RESPIRATORY W GRAM STAIN

## 2013-10-08 LAB — CULTURE, RESPIRATORY: CULTURE: NO GROWTH

## 2013-10-08 LAB — HEMOGLOBIN AND HEMATOCRIT, BLOOD
HEMATOCRIT: 22.7 % — AB (ref 39.0–52.0)
HEMOGLOBIN: 7.2 g/dL — AB (ref 13.0–17.0)

## 2013-10-08 LAB — OCCULT BLOOD X 1 CARD TO LAB, STOOL: Fecal Occult Bld: NEGATIVE

## 2013-10-08 NOTE — Progress Notes (Signed)
PULMONARY  / CRITICAL CARE MEDICINE  Name: Robert BushyDouglas J [name] MRN: [id number] DOB: [birth year]  CONSULTATION DATE: 09/30/2013  BRIEF PATIENT DESCRIPTION: 58 yo with h/o CVA, dysphagia, aspiration pneumonia and multiple prior intubations [facility].  CULTURES: BAL 2/18>>>yeast/candida BCx2 2/24>>> UC 2/24>>>neg Tracheal Asp 2/23>>>neg  KEY EVENTS 2/18 - Trach (KZ) 2/20 - atc 3 hours 2/25 - ATC for 48 hours, fatigued and back on vent  [facility]OTICS[facility]  EXAM General:  Appears chronically ill Neuro:  Encephalopathic, nonfocal, cough / gag diminished, no follows commands HEENT:  PERRL, orally intubated Cardiovascular:  RRR, no m/r/g Lungs:  Bilateral diminished bs bases Abdomen:  Soft, nontender, bowel sounds diminished Musculoskeletal:  No edema Skin:  No rash  LABS:  Recent Labs Lab 10/06/13 1040 10/07/13 0630 10/08/13 0500  NA 148* 149* 148*  K 4.2 4.6 4.8  CL 113* 113* 112  CO2 23 25 25   BUN 55* 50* 48*  CREATININE 1.35 1.30 1.21  GLUCOSE 140* 111* 207*    Recent Labs Lab 10/06/13 1655 10/07/13 0630 10/08/13 0500 10/08/13 0830  HGB 6.9* 7.2* 6.7* 7.2*  HCT 21.2* 22.1* 20.9* 22.7*  WBC 14.5* 13.7* 13.3*  --   PLT 330 346 337  --    Vital signs reviewed. Abnormal values will appear under impression plan section.   IMAGING: No results found.  ASSESSMENT: Acute on chronic respiratory failure Acute on chronic renal failure Acute encephalopathy Aspiration pneumonia UTI with VRE SIRS / sepsis  PLAN: Continuous mechanical support, wean per protocol.  Back to vent 2/25 am with fatigue, repeat ABG 30 min post vent Trach care per protocol, sutures will need to be removed in 7-10 days post trach (2/18) Trend ABG/CXR Mucomyst Scheduled BD's Mobilize as able PPI / Heparin    Canary BrimBrandi Ollis, NP-C [place] [facility] Pgr: [phone] or [phone]  Decompensated overnight, required placement back on vent.   Comfortable now on full support.  CXR ordered and pending that will decide on ?bronch for atelectasis.  Patient seen and examined, agree with above note.  I dictated the care and orders written for this patient under my direction.  Alyson ReedyWesam G Rustyn Conery, MD [phone]

## 2013-10-09 ENCOUNTER — Other Ambulatory Visit (HOSPITAL_COMMUNITY): Payer: Medicare Other

## 2013-10-10 LAB — CBC
HCT: 19.9 % — ABNORMAL LOW (ref 39.0–52.0)
HEMOGLOBIN: 6.3 g/dL — AB (ref 13.0–17.0)
MCH: 23.8 pg — ABNORMAL LOW (ref 26.0–34.0)
MCHC: 31.7 g/dL (ref 30.0–36.0)
MCV: 75.1 fL — AB (ref 78.0–100.0)
PLATELETS: 311 10*3/uL (ref 150–400)
RBC: 2.65 MIL/uL — AB (ref 4.22–5.81)
RDW: 16.8 % — ABNORMAL HIGH (ref 11.5–15.5)
WBC: 12 10*3/uL — ABNORMAL HIGH (ref 4.0–10.5)

## 2013-10-10 LAB — RETICULOCYTES
RBC.: 2.7 MIL/uL — AB (ref 4.22–5.81)
Retic Count, Absolute: 27 10*3/uL (ref 19.0–186.0)
Retic Ct Pct: 1 % (ref 0.4–3.1)

## 2013-10-10 LAB — BASIC METABOLIC PANEL
BUN: 42 mg/dL — AB (ref 6–23)
CHLORIDE: 114 meq/L — AB (ref 96–112)
CO2: 26 meq/L (ref 19–32)
Calcium: 7.8 mg/dL — ABNORMAL LOW (ref 8.4–10.5)
Creatinine, Ser: 1.26 mg/dL (ref 0.50–1.35)
GFR calc Af Amer: 72 mL/min — ABNORMAL LOW (ref >90)
GFR calc non Af Amer: 62 mL/min — ABNORMAL LOW (ref >90)
GLUCOSE: 150 mg/dL — AB (ref 70–99)
Potassium: 5.4 mEq/L — ABNORMAL HIGH (ref 3.7–5.3)
SODIUM: 150 meq/L — AB (ref 137–147)

## 2013-10-10 LAB — DIRECT ANTIGLOBULIN TEST (NOT AT ARMC)
DAT, COMPLEMENT: NEGATIVE
DAT, IGG: POSITIVE

## 2013-10-10 LAB — ABO/RH: ABO/RH(D): O POS

## 2013-10-10 LAB — PROCALCITONIN: Procalcitonin: 0.62 ng/mL

## 2013-10-10 LAB — PREPARE RBC (CROSSMATCH)

## 2013-10-10 NOTE — Progress Notes (Signed)
PULMONARY  / CRITICAL CARE MEDICINE  Name: Robert BushyDouglas J [name] MRN: [id number] DOB: [birth year]  CONSULTATION DATE: 09/30/2013  BRIEF PATIENT DESCRIPTION: 58 yo with h/o CVA, dysphagia, aspiration pneumonia and multiple [facility]  CULTURES: BAL 2/18>>>yeast/candida BCx2 2/24>>> UC 2/24>>>neg Tracheal Asp 2/23>>>neg  KEY EVENTS 2/18 - Trach (KZ) 2/20 - atc 3 hours 2/25 - ATC for 48 hours, fatigued and back on vent, ABG 30 min post vent good 2/27 - weaning on 40% ATC, tolerating well  ANTIBIOTICS: [facility]  EXAM General:  Appears chronically ill Neuro:  Encephalopathic, nonfocal, cough / gag diminished, no follows commands HEENT:  PERRL, #8 trach midline c/d/i Cardiovascular:  RRR, no m/r/g Lungs:  Bilateral diminished bs bases Abdomen:  Soft, nontender, bowel sounds diminished Musculoskeletal:  No edema Skin:  No rash  LABS:  Recent Labs Lab 10/07/13 0630 10/08/13 0500 10/10/13 0500  NA 149* 148* 150*  K 4.6 4.8 5.4*  CL 113* 112 114*  CO2 25 25 26   BUN 50* 48* 42*  CREATININE 1.30 1.21 1.26  GLUCOSE 111* 207* 150*    Recent Labs Lab 10/06/13 1655 10/07/13 0630 10/08/13 0500 10/08/13 0830  HGB 6.9* 7.2* 6.7* 7.2*  HCT 21.2* 22.1* 20.9* 22.7*  WBC 14.5* 13.7* 13.3*  --   PLT 330 346 337  --    Vital signs reviewed. Abnormal values will appear under impression plan section.   IMAGING: Dg Chest Port 1 View  10/09/2013   CLINICAL DATA:  Infiltrates, followup  EXAM: PORTABLE CHEST - 1 VIEW  COMPARISON:  Portable exam 1510 hr compared to 10/06/2013  FINDINGS: Tracheostomy tube stable tip projecting over tracheal air column.  Tip of right arm PICC line projects over right subclavian vein.  Upper normal heart size.  Right-side aortic arch.  Diffuse bilateral pulmonary infiltrates again seen.  Probable left pleural effusion, cannot exclude additional consolidation are atelectasis in left lower lobe.  No pneumothorax.   Bones unremarkable.  IMPRESSION: Persistent pulmonary infiltrates with left pleural effusion and atelectasis versus consolidation in left lower lobe, little changed.  Right-side aortic arch.   Electronically Signed   By: Ulyses SouthwardMark  Boles M.D.   On: 10/09/2013 15:20    ASSESSMENT: Acute on chronic respiratory failure Acute on chronic renal failure Acute encephalopathy Aspiration pneumonia UTI with VRE SIRS / sepsis  PLAN: Continuous mechanical support, wean per protocol.  Allow for slower wean with hx of fatigue after 48 hours Trach care per protocol, sutures will need to be removed in 7-10 days post trach (2/18) Trend ABG/CXR Mucomyst Scheduled BD's Mobilize as able PPI / Heparin     Canary BrimBrandi Ollis, NP-C [place] [facility] Pgr: [phone] or [phone]  Febrile now, failed TC trials, abx and cultures reviewed, agree with that plan, would avoid TC for now and proceed with PS trials as ordered.  Patient seen and examined, agree with above note.  I dictated the care and orders written for this patient under my direction.  Alyson ReedyWesam G Johntay Doolen, MD [phone]

## 2013-10-11 LAB — PROTIME-INR
INR: 1.22 (ref 0.00–1.49)
PROTHROMBIN TIME: 15.1 s (ref 11.6–15.2)

## 2013-10-11 LAB — URINE MICROSCOPIC-ADD ON

## 2013-10-11 LAB — CBC
HCT: 23.2 % — ABNORMAL LOW (ref 39.0–52.0)
Hemoglobin: 7.5 g/dL — ABNORMAL LOW (ref 13.0–17.0)
MCH: 24.7 pg — AB (ref 26.0–34.0)
MCHC: 32.3 g/dL (ref 30.0–36.0)
MCV: 76.3 fL — ABNORMAL LOW (ref 78.0–100.0)
PLATELETS: 281 10*3/uL (ref 150–400)
RBC: 3.04 MIL/uL — ABNORMAL LOW (ref 4.22–5.81)
RDW: 17.4 % — ABNORMAL HIGH (ref 11.5–15.5)
WBC: 13.6 10*3/uL — ABNORMAL HIGH (ref 4.0–10.5)

## 2013-10-11 LAB — RETICULOCYTES
RBC.: 3.04 MIL/uL — AB (ref 4.22–5.81)
RETIC CT PCT: 1.2 % (ref 0.4–3.1)
Retic Count, Absolute: 36.5 10*3/uL (ref 19.0–186.0)

## 2013-10-11 LAB — BASIC METABOLIC PANEL
BUN: 42 mg/dL — AB (ref 6–23)
CO2: 28 mEq/L (ref 19–32)
Calcium: 7.8 mg/dL — ABNORMAL LOW (ref 8.4–10.5)
Chloride: 111 mEq/L (ref 96–112)
Creatinine, Ser: 1.38 mg/dL — ABNORMAL HIGH (ref 0.50–1.35)
GFR calc Af Amer: 65 mL/min — ABNORMAL LOW (ref >90)
GFR, EST NON AFRICAN AMERICAN: 56 mL/min — AB (ref >90)
GLUCOSE: 169 mg/dL — AB (ref 70–99)
Potassium: 5.5 mEq/L — ABNORMAL HIGH (ref 3.7–5.3)
SODIUM: 149 meq/L — AB (ref 137–147)

## 2013-10-11 LAB — IRON AND TIBC
Iron: 13 ug/dL — ABNORMAL LOW (ref 42–135)
Saturation Ratios: 9 % — ABNORMAL LOW (ref 20–55)
TIBC: 143 ug/dL — ABNORMAL LOW (ref 215–435)
UIBC: 130 ug/dL (ref 125–400)

## 2013-10-11 LAB — TYPE AND SCREEN
ABO/RH(D): O POS
Antibody Screen: NEGATIVE
Unit division: 0

## 2013-10-11 LAB — APTT: aPTT: 40 seconds — ABNORMAL HIGH (ref 24–37)

## 2013-10-11 LAB — URINALYSIS, ROUTINE W REFLEX MICROSCOPIC
Bilirubin Urine: NEGATIVE
Glucose, UA: NEGATIVE mg/dL
HGB URINE DIPSTICK: NEGATIVE
Ketones, ur: NEGATIVE mg/dL
Nitrite: NEGATIVE
SPECIFIC GRAVITY, URINE: 1.014 (ref 1.005–1.030)
UROBILINOGEN UA: 0.2 mg/dL (ref 0.0–1.0)
pH: 8.5 — ABNORMAL HIGH (ref 5.0–8.0)

## 2013-10-11 LAB — DIRECT ANTIGLOBULIN TEST (NOT AT ARMC)
DAT, COMPLEMENT: NEGATIVE
DAT, IgG: NEGATIVE

## 2013-10-11 LAB — FERRITIN: Ferritin: 826 ng/mL — ABNORMAL HIGH (ref 22–322)

## 2013-10-11 LAB — HAPTOGLOBIN: Haptoglobin: 308 mg/dL — ABNORMAL HIGH (ref 45–215)

## 2013-10-12 ENCOUNTER — Other Ambulatory Visit (HOSPITAL_COMMUNITY): Payer: Medicare Other

## 2013-10-12 ENCOUNTER — Ambulatory Visit: Payer: Self-pay | Admitting: Hospice and Palliative Medicine

## 2013-10-12 LAB — CULTURE, BLOOD (ROUTINE X 2)
Culture: NO GROWTH
Culture: NO GROWTH

## 2013-10-12 LAB — CBC
HCT: 23.9 % — ABNORMAL LOW (ref 39.0–52.0)
Hemoglobin: 7.6 g/dL — ABNORMAL LOW (ref 13.0–17.0)
MCH: 24.4 pg — ABNORMAL LOW (ref 26.0–34.0)
MCHC: 31.8 g/dL (ref 30.0–36.0)
MCV: 76.8 fL — AB (ref 78.0–100.0)
Platelets: 282 10*3/uL (ref 150–400)
RBC: 3.11 MIL/uL — ABNORMAL LOW (ref 4.22–5.81)
RDW: 18.4 % — AB (ref 11.5–15.5)
WBC: 12.2 10*3/uL — AB (ref 4.0–10.5)

## 2013-10-12 LAB — BASIC METABOLIC PANEL
BUN: 43 mg/dL — AB (ref 6–23)
CO2: 27 meq/L (ref 19–32)
CREATININE: 1.46 mg/dL — AB (ref 0.50–1.35)
Calcium: 7.9 mg/dL — ABNORMAL LOW (ref 8.4–10.5)
Chloride: 113 mEq/L — ABNORMAL HIGH (ref 96–112)
GFR calc Af Amer: 60 mL/min — ABNORMAL LOW (ref >90)
GFR calc non Af Amer: 52 mL/min — ABNORMAL LOW (ref >90)
Glucose, Bld: 280 mg/dL — ABNORMAL HIGH (ref 70–99)
Potassium: 6 mEq/L — ABNORMAL HIGH (ref 3.7–5.3)
Sodium: 152 mEq/L — ABNORMAL HIGH (ref 137–147)

## 2013-10-12 LAB — URINE CULTURE
COLONY COUNT: NO GROWTH
Culture: NO GROWTH

## 2013-10-12 LAB — CLOSTRIDIUM DIFFICILE BY PCR: Toxigenic C. Difficile by PCR: NEGATIVE

## 2013-10-13 ENCOUNTER — Other Ambulatory Visit (HOSPITAL_COMMUNITY): Payer: Medicare Other

## 2013-10-13 ENCOUNTER — Other Ambulatory Visit (HOSPITAL_COMMUNITY): Payer: Medicaid Other

## 2013-10-13 LAB — BASIC METABOLIC PANEL
BUN: 39 mg/dL — AB (ref 6–23)
CO2: 25 mEq/L (ref 19–32)
CREATININE: 1.45 mg/dL — AB (ref 0.50–1.35)
Calcium: 8.1 mg/dL — ABNORMAL LOW (ref 8.4–10.5)
Chloride: 110 mEq/L (ref 96–112)
GFR, EST AFRICAN AMERICAN: 61 mL/min — AB (ref >90)
GFR, EST NON AFRICAN AMERICAN: 52 mL/min — AB (ref >90)
GLUCOSE: 246 mg/dL — AB (ref 70–99)
POTASSIUM: 5.1 meq/L (ref 3.7–5.3)
Sodium: 147 mEq/L (ref 137–147)

## 2013-10-13 LAB — CBC
HCT: 26.6 % — ABNORMAL LOW (ref 39.0–52.0)
HEMOGLOBIN: 8.4 g/dL — AB (ref 13.0–17.0)
MCH: 24.4 pg — AB (ref 26.0–34.0)
MCHC: 31.6 g/dL (ref 30.0–36.0)
MCV: 77.3 fL — ABNORMAL LOW (ref 78.0–100.0)
Platelets: 303 10*3/uL (ref 150–400)
RBC: 3.44 MIL/uL — ABNORMAL LOW (ref 4.22–5.81)
RDW: 18.8 % — ABNORMAL HIGH (ref 11.5–15.5)
WBC: 12.3 10*3/uL — ABNORMAL HIGH (ref 4.0–10.5)

## 2013-10-13 NOTE — Progress Notes (Signed)
PULMONARY  / CRITICAL CARE MEDICINE  Name: Grace BushyDouglas J Gorton MRN: [id number] DOB: 11/20/1955  CONSULTATION DATE: 09/30/2013  BRIEF PATIENT DESCRIPTION: 58 y/o with h/o CVA, dysphagia, aspiration pneumonia and multiple prior intubations [facility]  CULTURES: BAL 2/18>>>yeast/candida BCx2 2/24>>>neg UC 2/24>>>neg Tracheal Asp 2/23>>>neg Tracheal Asp 2/28>>>abundant GNR>>>  KEY EVENTS 2/18 - Trach (KZ) 2/20 - atc 3 hours 2/25 - ATC for 48 hours, fatigued and back on vent, ABG 30 min post vent good 2/27 - weaning on 40% ATC, tolerating well 3/02 - seizure and placed back on full support ventilation   [facility]OT[facility]  EXAM General:  Appears chronically ill Neuro:  Encephalopathic, nonfocal, cough / gag +, no follows commands HEENT:  PERRL, #8 trach midline c/d/i Cardiovascular:  RRR, no m/r/g Lungs:  Bilateral diminished bs bases Abdomen:  Soft, nontender, bowel sounds diminished Musculoskeletal:  No edema Skin:  No rash  LABS:  Recent Labs Lab 10/11/13 0500 10/12/13 0500 10/13/13 0930  NA 149* 152* 147  K 5.5* 6.0* 5.1  CL 111 113* 110  CO2 28 27 25   BUN 42* 43* 39*  CREATININE 1.38* 1.46* 1.45*  GLUCOSE 169* 280* 246*    Recent Labs Lab 10/11/13 0500 10/12/13 0500 10/13/13 0930  HGB 7.5* 7.6* 8.4*  HCT 23.2* 23.9* 26.6*  WBC 13.6* 12.2* 12.3*  PLT 281 282 303   Vital signs reviewed. Abnormal values will appear under impression plan section.   IMAGING: Ct Head Wo Contrast  10/13/2013   CLINICAL DATA:  Seizures  EXAM: CT HEAD WITHOUT CONTRAST  TECHNIQUE: Contiguous axial images were obtained from the base of the skull through the vertex without intravenous contrast.  COMPARISON:  CT HEAD W/O CM dated 09/22/2013; US CAROTID DUPLEX BILAT dated 05/15/2013  FINDINGS: Right middle cerebral artery territory encephalomalacia is stable. No evidence of new infarct. No evidence of hemorrhage or extra-axial fluid. No hydrocephalus. Calvarium  is intact. Right mastoid air cells are opacified. This is a change from the prior study. Chronic right maxillary sinus inflammation appears similar. There is partial opacification of left mastoid air cells which is stable.  IMPRESSION: 1. Stable encephalomalacia right MCA territory 2. New mastoid air cell opacification on the right. This can be seen with mastoiditis. It is a nonspecific finding.   Electronically Signed   By: Esperanza Heiraymond  Rubner M.D.   On: 10/13/2013 12:38   Dg Chest Port 1 View  10/13/2013   CLINICAL DATA:  Tracheostomy, ventilatory support  EXAM: PORTABLE CHEST - 1 VIEW  COMPARISON:  10/12/2013  FINDINGS: Stable tracheostomy tip 5.7 cm above the carina. Right-sided aortic arch noted as before. Diffuse coarse mixed interstitial and airspace opacities throughout both lungs. Grossly stable in appearance. No enlarging effusion or pneumothorax. Right PICC line tip within the right subclavian vein region.  IMPRESSION: Stable diffuse mixed interstitial and airspace opacities, nonspecific. No interval change.   Electronically Signed   By: Ruel Favorsrevor  Shick M.D.   On: 10/13/2013 08:56   Dg Chest Port 1 View  10/12/2013   CLINICAL DATA:  Infiltrates  EXAM: PORTABLE CHEST - 1 VIEW  COMPARISON:  10/09/2013  FINDINGS: Cardiomediastinal silhouette is stable. Right side aortic arch again noted. Tracheostomy tube is unchanged in position. Diffuse bilateral infiltrates again noted. Decrease in left pleural effusion.  IMPRESSION: Stable tracheostomy tube position. Again noted diffuse bilateral infiltrates. Decrease in left pleural effusion.   Electronically Signed   By: Natasha MeadLiviu  Pop M.D.   On: 10/12/2013  10:05    ASSESSMENT: Acute on chronic respiratory failure Acute on chronic renal failure Acute encephalopathy Aspiration pneumonia UTI with VRE SIRS / sepsis Seizures - new onset 3/2  PLAN: -Continuous mechanical support, wean per protocol.  Will need slow wean.  3/2 Hold further for minimum 24 hours and  reassess mental status  -Trach care per protocol, sutures will need to be removed in 7-10 days post trach (2/18) -->ok to d/c trach sutures 3/2 -Trend ABG/CXR -Mucomyst -Scheduled BD's -Mobilize as able -PPI / Heparin  -Follow Cultures -ABX per primary   Canary Brim, NP-C [place] [facility] Pgr: [phone] or [phone]  Billy Fischer, MD ; [facility] service Mobile [phone].  After 5:30 PM or weekends, call [phone]

## 2013-10-14 LAB — MAGNESIUM: MAGNESIUM: 1.7 mg/dL (ref 1.5–2.5)

## 2013-10-14 NOTE — Progress Notes (Signed)
[facility]                                                                                              Progress note     Patient Demographics  Robert [name], is a 58 y.o. male  ZOX:[id number]  WJX:[id number]  DOB - Aug 13, 1956  Admit date - 09/29/2013  Admitting Physician Elnora Morrison, MD  Outpatient Primary MD for the patient is [name], MD  LOS - 15   CC   respiratory failure          PCM         Generalized weakness      Subjective:   [name] today cannot give any history due to the neuro status  Objective:   Vital signs  Temperature Heart rate Respiratory rate Blood pressure Pulse ox    Exam Awake Alert, confused,  No new F.N deficits,  [place].AT, mild facial deviation noted Supple Neck,No JVD, No cervical lymphadenopathy appriciated , tracheostomy at midline , increased secretions.  Symmetrical Chest wall movement, decreased breath sounds at the bases with mild scattered rhonchi RRR,No Gallops,Rubs or new Murmurs, No Parasternal Heave +ve B.Sounds, Abd Soft, Non tender, No organomegaly appriciated, No rebound - guarding or rigidity, GJ-tube in place. No Cyanosis, Clubbing or edema, No new Rash or bruise  Status post left BKA     I&Os    Peg Tube  Foley yes  Trach yes        Data Review   CBC  Recent Labs Lab 10/08/13 0500 10/08/13 0830 10/10/13 0800 10/11/13 0500 10/12/13 0500 10/13/13 0930  WBC 13.3*  --  12.0* 13.6* 12.2* 12.3*  HGB 6.7* 7.2* 6.3* 7.5* 7.6* 8.4*  HCT 20.9* 22.7* 19.9* 23.2* 23.9* 26.6*  PLT 337  --  311 281 282 303  MCV 74.1*  --  75.1* 76.3* 76.8* 77.3*  MCH 23.8*  --  23.8* 24.7* 24.4* 24.4*  MCHC 32.1  --  31.7 32.3 31.8 31.6  RDW 17.1*  --  16.8* 17.4* 18.4* 18.8*    Chemistries   Recent Labs Lab 10/08/13 0500 10/10/13 0500 10/11/13 0500 10/12/13 0500 10/13/13 0930 10/14/13 0500   NA 148* 150* 149* 152* 147  --   K 4.8 5.4* 5.5* 6.0* 5.1  --   CL 112 114* 111 113* 110  --   CO2 25 26 28 27 25   --   GLUCOSE 207* 150* 169* 280* 246*  --   BUN 48* 42* 42* 43* 39*  --   CREATININE 1.21 1.26 1.38* 1.46* 1.45*  --   CALCIUM 8.4 7.8* 7.8* 7.9* 8.1*  --   MG  --   --   --   --   --  1.7    Coagulation profile  Recent Labs Lab 10/11/13 0500  INR 1.22    Micro Results Recent Results (from the past 240 hour(s))  CULTURE, RESPIRATORY (NON-EXPECTORATED)     Status: None   Collection Time    10/06/13 10:06 AM      Result Value Ref Range Status   Specimen Description TRACHEAL  ASPIRATE   Final   Special Requests NONE   Final   Gram Stain     Final   Value: RARE WBC PRESENT, PREDOMINANTLY PMN     NO SQUAMOUS EPITHELIAL CELLS SEEN     NO ORGANISMS SEEN     Performed at [facility]   Culture     Final   Value: NO GROWTH 2 DAYS     Performed at [facility]   Report Status 10/08/2013 FINAL   Final  URINE CULTURE     Status: None   Collection Time    10/06/13 10:07 AM      Result Value Ref Range Status   Specimen Description URINE, RANDOM   Final   Special Requests NONE   Final   Culture  Setup Time     Final   Value: 10/06/2013 16:34     Performed at [facility]   Colony Count     Final   Value: NO GROWTH     Performed at [facility]   Culture     Final   Value: NO GROWTH     Performed at [facility]   Report Status 10/07/2013 FINAL   Final  CULTURE, BLOOD (ROUTINE X 2)     Status: None   Collection Time    10/06/13 10:15 AM      Result Value Ref Range Status   Specimen Description BLOOD RIGHT ANTECUBITAL   Final   Special Requests BOTTLES DRAWN AEROBIC AND ANAEROBIC 10CC   Final   Culture  Setup Time     Final   Value: 10/06/2013 16:17     Performed at [facility]   Culture     Final   Value: NO GROWTH 5 DAYS     Performed at [facility]   Report Status 10/12/2013 FINAL   Final   CULTURE, BLOOD (ROUTINE X 2)     Status: None   Collection Time    10/06/13 10:40 AM      Result Value Ref Range Status   Specimen Description BLOOD RIGHT ARM   Final   Special Requests BOTTLES DRAWN AEROBIC AND ANAEROBIC 8CC   Final   Culture  Setup Time     Final   Value: 10/06/2013 16:17     Performed at [facility]   Culture     Final   Value: NO GROWTH 5 DAYS     Performed at [facility]   Report Status 10/12/2013 FINAL   Final  URINE CULTURE     Status: None   Collection Time    10/11/13  1:39 PM      Result Value Ref Range Status   Specimen Description URINE, CATHETERIZED   Final   Special Requests NONE   Final   Culture  Setup Time     Final   Value: 10/11/2013 21:41     Performed at [facility]   Colony Count     Final   Value: NO GROWTH     Performed at [facility]   Culture     Final   Value: NO GROWTH     Performed at [facility]   Report Status 10/12/2013 FINAL   Final  CULTURE, RESPIRATORY (NON-EXPECTORATED)     Status: None   Collection Time    10/11/13  1:39 PM      Result Value Ref Range Status   Specimen Description  TRACHEAL ASPIRATE   Final   Special Requests NONE   Final   Gram Stain     Final   Value: ABUNDANT WBC PRESENT, PREDOMINANTLY PMN     FEW SQUAMOUS EPITHELIAL CELLS PRESENT     MODERATE GRAM NEGATIVE RODS     FEW GRAM NEGATIVE COCCI     Performed at [facility]   Culture     Final   Value: ABUNDANT ACINETOBACTER CALCOACETICUS/BAUMANNII COMPLEX     Note: MULTI DRUG RESISTANT ORGANISM CRITICAL RESULT CALLED TO, READ BACK BY AND VERIFIED WITH: MITZI R 3/3 @910  BY REAMM     Performed at [facility]   Report Status PENDING   Incomplete   Organism ID, Bacteria ACINETOBACTER CALCOACETICUS/BAUMANNII COMPLEX   Final  CULTURE, BLOOD (ROUTINE X 2)     Status: None   Collection Time    10/11/13  3:20 PM      Result Value Ref Range Status   Specimen Description BLOOD LEFT HAND   Final    Special Requests BOTTLES DRAWN AEROBIC AND ANAEROBIC B 10CC R 8CC   Final   Culture  Setup Time     Final   Value: 10/11/2013 21:30     Performed at [facility]   Culture     Final   Value:        BLOOD CULTURE RECEIVED NO GROWTH TO DATE CULTURE WILL BE HELD FOR 5 DAYS BEFORE ISSUING A FINAL NEGATIVE REPORT     Performed at [facility]   Report Status PENDING   Incomplete  CULTURE, BLOOD (ROUTINE X 2)     Status: None   Collection Time    10/11/13  3:25 PM      Result Value Ref Range Status   Specimen Description BLOOD LEFT HAND   Final   Special Requests BOTTLES DRAWN AEROBIC ONLY 10CC   Final   Culture  Setup Time     Final   Value: 10/11/2013 21:30     Performed at [facility]   Culture     Final   Value:        BLOOD CULTURE RECEIVED NO GROWTH TO DATE CULTURE WILL BE HELD FOR 5 DAYS BEFORE ISSUING A FINAL NEGATIVE REPORT     Performed at [facility]   Report Status PENDING   Incomplete  CLOSTRIDIUM DIFFICILE BY PCR     Status: None   Collection Time    10/12/13 12:32 PM      Result Value Ref Range Status   C difficile by pcr NEGATIVE  NEGATIVE Final       Assessment & Plan    1. Respiratory failure, status post tracheostomy; continue with PSV trials 2. Aspiration pneumonia , Acinetobacter, M.D.R -continue with minocycline, ID following 3. Status post CVA with hemiplegia continue with aspirin and Lipitor 4. Hyponatremia , continue with D5 water 5. Acute kidney injury, improving monitor creatinine 6. Diabetes mellitus; continue with levemir & insulin sliding scale 7. Peripheral vascular disease , status post left BKA, continue with aspirin and Lipitor 8. Protein calorie malnutrition ; continue with tube feeding, Glucerna 1.5 9. 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

## 2013-10-15 ENCOUNTER — Other Ambulatory Visit (HOSPITAL_COMMUNITY): Payer: Medicare Other

## 2013-10-15 LAB — BASIC METABOLIC PANEL
BUN: 39 mg/dL — ABNORMAL HIGH (ref 6–23)
CALCIUM: 7.9 mg/dL — AB (ref 8.4–10.5)
CO2: 24 meq/L (ref 19–32)
CREATININE: 1.61 mg/dL — AB (ref 0.50–1.35)
Chloride: 110 mEq/L (ref 96–112)
GFR calc Af Amer: 54 mL/min — ABNORMAL LOW (ref >90)
GFR, EST NON AFRICAN AMERICAN: 46 mL/min — AB (ref >90)
Glucose, Bld: 166 mg/dL — ABNORMAL HIGH (ref 70–99)
Potassium: 5 mEq/L (ref 3.7–5.3)
SODIUM: 145 meq/L (ref 137–147)

## 2013-10-15 LAB — CBC
HCT: 21.8 % — ABNORMAL LOW (ref 39.0–52.0)
Hemoglobin: 7.1 g/dL — ABNORMAL LOW (ref 13.0–17.0)
MCH: 25.1 pg — ABNORMAL LOW (ref 26.0–34.0)
MCHC: 32.6 g/dL (ref 30.0–36.0)
MCV: 77 fL — AB (ref 78.0–100.0)
PLATELETS: 353 10*3/uL (ref 150–400)
RBC: 2.83 MIL/uL — ABNORMAL LOW (ref 4.22–5.81)
RDW: 18.7 % — AB (ref 11.5–15.5)
WBC: 9.8 10*3/uL (ref 4.0–10.5)

## 2013-10-15 LAB — CULTURE, RESPIRATORY

## 2013-10-15 LAB — CULTURE, RESPIRATORY W GRAM STAIN

## 2013-10-15 NOTE — Progress Notes (Addendum)
[facility]                                                                                              Progress note     Patient Demographics  Robert [name], is a [age] y.o. male  ZOX:[id number]  WJX:[id number]  DOB - 02-Oct-1955  Admit date - 09/29/2013  Admitting Physician Elnora MorrisonAhmad B Barakat, MD  Outpatient Primary MD for the patient is [name], ROBERTO, MD  LOS - 16   CC   respiratory failure          PCM         Generalized weakness      Subjective:   Robert [name] today cannot give any history due to the neuro status  Objective:   Vital signs  Temperature 99.8 Heart rate 104 Respiratory rate 27 Blood pressure 105/40 Pulse ox    Exam Awake Alert, confused,  No new F.N deficits,  [place].AT, mild facial deviation noted Supple Neck,No JVD, No cervical lymphadenopathy appriciated , tracheostomy at midline , increased secretions.  Symmetrical Chest wall movement, decreased breath sounds at the bases with mild scattered rhonchi RRR,No Gallops,Rubs or new Murmurs, No Parasternal Heave +ve B.Sounds, Abd Soft, Non tender, No organomegaly appriciated, No rebound - guarding or rigidity, GJ-tube in place. No Cyanosis, Clubbing or edema, No new Rash or bruise  Status post left BKA     I&Os 3370/3850    Peg Tube/Glucerna 1.5 at 130 mL every 2 hours with H2O 150 mL every 2 hours  Foley yes  Trach yes        Data Review   CBC  Recent Labs Lab 10/10/13 0800 10/11/13 0500 10/12/13 0500 10/13/13 0930 10/15/13 0546  WBC 12.0* 13.6* 12.2* 12.3* 9.8  HGB 6.3* 7.5* 7.6* 8.4* 7.1*  HCT 19.9* 23.2* 23.9* 26.6* 21.8*  PLT 311 281 282 303 353  MCV 75.1* 76.3* 76.8* 77.3* 77.0*  MCH 23.8* 24.7* 24.4* 24.4* 25.1*  MCHC 31.7 32.3 31.8 31.6 32.6  RDW 16.8* 17.4* 18.4* 18.8* 18.7*    Chemistries   Recent Labs Lab 10/10/13 0500 10/11/13 0500 10/12/13 0500  10/13/13 0930 10/14/13 0500 10/15/13 0546  NA 150* 149* 152* 147  --  145  K 5.4* 5.5* 6.0* 5.1  --  5.0  CL 114* 111 113* 110  --  110  CO2 26 28 27 25   --  24  GLUCOSE 150* 169* 280* 246*  --  166*  BUN 42* 42* 43* 39*  --  39*  CREATININE 1.26 1.38* 1.46* 1.45*  --  1.61*  CALCIUM 7.8* 7.8* 7.9* 8.1*  --  7.9*  MG  --   --   --   --  1.7  --     Coagulation profile  Recent Labs Lab 10/11/13 0500  INR 1.22         Assessment & Plan    1. Respiratory failure, status post tracheostomy; continue with ATC trials     Vent settings : AC/VC 550 , 14 , 30%, peep 5 2. Aspiration pneumonia , Acinetobacter, M.D.R -continue with minocycline, ID following  3. Status post CVA with hemiplegia continue with aspirin and Lipitor 4. Hyponatremia , continue with D5 water 5. Acute kidney injury, improving monitor creatinine 6. Diabetes mellitus; continue with levemir & insulin sliding scale 7. Peripheral vascular disease , status post left BKA, continue with aspirin and Lipitor 8. Protein calorie malnutrition ; continue with tube feeding, Glucerna 1.5 9. 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

## 2013-10-16 NOTE — Progress Notes (Addendum)
[facility]                                                                                              Progress note     Patient Demographics  Robert Robert [name], is a 58 y.o. male  WUJ:[id number]  NFA:[id number]  DOB - 02/25/1956  Admit date - 09/29/2013  Admitting Physician Elnora MorrisonAhmad B Barakat, MD  Outpatient Primary MD for the patient is Robert FurnaceSANCHEZ GUTIERREZ, ROBERTO, MD  LOS - 17   CC   respiratory failure          PCM         Generalized weakness      Subjective:   Robert Lightningouglas [name] today cannot give any history due to the neuro status  Objective:   Vital signs  Temperature 98.2 Heart rate 88 Respiratory rate 20 Blood pressure 97/77 Pulse ox 100%    Exam Awake Alert, confused,  No new F.N deficits,  [place].AT, mild facial deviation noted Supple Neck,No JVD, No cervical lymphadenopathy appriciated , tracheostomy at midline , increased secretions.  Symmetrical Chest wall movement, decreased breath sounds at the bases with mild scattered rhonchi RRR,No Gallops,Rubs or new Murmurs, No Parasternal Heave +ve B.Sounds, Abd Soft, Non tender, No organomegaly appriciated, No rebound - guarding or rigidity, GJ-tube in place. No Cyanosis, Clubbing or edema, No new Rash or bruise  Status post left BKA     I&Os 3370/3850    Peg Tube/Glucerna 1.5 at 130 mL every 2 hours with H2O 150 mL every 2 hours  Foley yes  Trach yes        Data Review   CBC  Recent Labs Lab 10/10/13 0800 10/11/13 0500 10/12/13 0500 10/13/13 0930 10/15/13 0546  WBC 12.0* 13.6* 12.2* 12.3* 9.8  HGB 6.3* 7.5* 7.6* 8.4* 7.1*  HCT 19.9* 23.2* 23.9* 26.6* 21.8*  PLT 311 281 282 303 353  MCV 75.1* 76.3* 76.8* 77.3* 77.0*  MCH 23.8* 24.7* 24.4* 24.4* 25.1*  MCHC 31.7 32.3 31.8 31.6 32.6  RDW 16.8* 17.4* 18.4* 18.8* 18.7*    Chemistries   Recent Labs Lab 10/10/13 0500 10/11/13 0500 10/12/13 0500  10/13/13 0930 10/14/13 0500 10/15/13 0546  NA 150* 149* 152* 147  --  145  K 5.4* 5.5* 6.0* 5.1  --  5.0  CL 114* 111 113* 110  --  110  CO2 26 28 27 25   --  24  GLUCOSE 150* 169* 280* 246*  --  166*  BUN 42* 42* 43* 39*  --  39*  CREATININE 1.26 1.38* 1.46* 1.45*  --  1.61*  CALCIUM 7.8* 7.8* 7.9* 8.1*  --  7.9*  MG  --   --   --   --  1.7  --     Coagulation profile  Recent Labs Lab 10/11/13 0500  INR 1.22         Assessment & Plan    1. Respiratory failure, status post tracheostomy; continue with ATC trials     Vent settings : AC/VC 550 , 14 , 30%, peep 5     Check ABGs in a.m. 2. Aspiration  pneumonia , Acinetobacter, M.D.R -continue with minocycline, ID following, white blood cell count improving. 3. Status post CVA with hemiplegia continue with aspirin and Lipitor 4. Hyponatremia , continue with D5 water 5. Acute kidney injury, improving monitor creatinine 6. Diabetes mellitus; not well controlled, continue with levemir & insulin sliding scale, decrease levemir to 10 units subcutaneous in a.m. and 14 units subcutaneous in the p.m. 7. Peripheral vascular disease , status post left BKA, continue with aspirin and Lipitor 8. Protein calorie malnutrition ; continue with tube feeding, Glucerna 1.5 9. 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 Decrease Levemir in a.m. Start Lomotil due to diarrhea Discussed with ID, pharmacy and dietitian  Code Status: Full     Consults  ID, PCCM  Antibiotics  minocycline  DVT Prophylaxis  heparin   Carron Curie M.D on 10/16/2013 at 2:50 PM

## 2013-10-17 ENCOUNTER — Other Ambulatory Visit (HOSPITAL_COMMUNITY): Payer: Medicare Other

## 2013-10-17 LAB — BLOOD GAS, ARTERIAL
ACID-BASE DEFICIT: 0.6 mmol/L (ref 0.0–2.0)
Bicarbonate: 23.3 mEq/L (ref 20.0–24.0)
FIO2: 0.28 %
O2 Saturation: 94.9 %
PATIENT TEMPERATURE: 98.6
TCO2: 24.4 mmol/L (ref 0–100)
pCO2 arterial: 36.7 mmHg (ref 35.0–45.0)
pH, Arterial: 7.419 (ref 7.350–7.450)
pO2, Arterial: 72.9 mmHg — ABNORMAL LOW (ref 80.0–100.0)

## 2013-10-17 LAB — CBC
HEMATOCRIT: 24.1 % — AB (ref 39.0–52.0)
Hemoglobin: 7.7 g/dL — ABNORMAL LOW (ref 13.0–17.0)
MCH: 24.4 pg — ABNORMAL LOW (ref 26.0–34.0)
MCHC: 32 g/dL (ref 30.0–36.0)
MCV: 76.5 fL — AB (ref 78.0–100.0)
Platelets: 473 10*3/uL — ABNORMAL HIGH (ref 150–400)
RBC: 3.15 MIL/uL — ABNORMAL LOW (ref 4.22–5.81)
RDW: 18.4 % — AB (ref 11.5–15.5)
WBC: 10.7 10*3/uL — ABNORMAL HIGH (ref 4.0–10.5)

## 2013-10-17 LAB — CULTURE, BLOOD (ROUTINE X 2)
CULTURE: NO GROWTH
Culture: NO GROWTH

## 2013-10-17 LAB — BASIC METABOLIC PANEL
BUN: 45 mg/dL — ABNORMAL HIGH (ref 6–23)
CO2: 22 mEq/L (ref 19–32)
CREATININE: 1.47 mg/dL — AB (ref 0.50–1.35)
Calcium: 8.1 mg/dL — ABNORMAL LOW (ref 8.4–10.5)
Chloride: 108 mEq/L (ref 96–112)
GFR, EST AFRICAN AMERICAN: 60 mL/min — AB (ref >90)
GFR, EST NON AFRICAN AMERICAN: 52 mL/min — AB (ref >90)
Glucose, Bld: 123 mg/dL — ABNORMAL HIGH (ref 70–99)
Potassium: 5.4 mEq/L — ABNORMAL HIGH (ref 3.7–5.3)
Sodium: 143 mEq/L (ref 137–147)

## 2013-10-17 NOTE — Progress Notes (Signed)
[facility]                                                                                              Progress note     Patient Demographics  Robert [name], is a 58 y.o. male  ZHY:[id number]  [id number]  DOB - 04/16/1956  Admit date - 09/29/2013  Admitting Physician Elnora Morrison, MD  Outpatient Primary MD for the patient is [name], MD  LOS - 18   CC   respiratory failure          PCM         Generalized weakness      Subjective:   [name] today cannot give any history due to the neuro status  Objective:   Vital signs  Temperature 97.6 Heart rate 82 Respiratory rate 20 Blood pressure 111/32 Pulse ox 99%    Exam Awake Alert, confused,  No new F.N deficits,  [name], mild facial deviation noted Supple Neck,No JVD, No cervical lymphadenopathy appriciated , tracheostomy at midline , increased secretions.  Symmetrical Chest wall movement, decreased breath sounds at the bases with mild scattered rhonchi RRR,No Gallops,Rubs or new Murmurs, No Parasternal Heave +ve B.Sounds, Abd Soft, Non tender, No organomegaly appriciated, No rebound - guarding or rigidity, GJ-tube in place. No Cyanosis, Clubbing or edema, No new Rash or bruise  Status post left BKA     I&Os 3355/2700  Peg Tube/Glucerna 1.5 at 130 mL every 2 hours with H2O 150 mL every 2 hours  Foley yes  Trach yes  Data Review   CBC  Recent Labs Lab 10/11/13 0500 10/12/13 0500 10/13/13 0930 10/15/13 0546 10/17/13 0615  WBC 13.6* 12.2* 12.3* 9.8 10.7*  HGB 7.5* 7.6* 8.4* 7.1* 7.7*  HCT 23.2* 23.9* 26.6* 21.8* 24.1*  PLT 281 282 303 353 473*  MCV 76.3* 76.8* 77.3* 77.0* 76.5*  MCH 24.7* 24.4* 24.4* 25.1* 24.4*  MCHC 32.3 31.8 31.6 32.6 32.0  RDW 17.4* 18.4* 18.8* 18.7* 18.4*    Chemistries   Recent Labs Lab 10/11/13 0500 10/12/13 0500 10/13/13 0930 10/14/13 0500  10/15/13 0546 10/17/13 0615  NA 149* 152* 147  --  145 143  K 5.5* 6.0* 5.1  --  5.0 5.4*  CL 111 113* 110  --  110 108  CO2 28 27 25   --  24 22  GLUCOSE 169* 280* 246*  --  166* 123*  BUN 42* 43* 39*  --  39* 45*  CREATININE 1.38* 1.46* 1.45*  --  1.61* 1.47*  CALCIUM 7.8* 7.9* 8.1*  --  7.9* 8.1*  MG  --   --   --  1.7  --   --     Coagulation profile  Recent Labs Lab 10/11/13 0500  INR 1.22    Assessment & Plan    1. Respiratory failure, status post tracheostomy; continue with ATC trials     Patient has been on ATC 28% for 48 hours. Secretions are minimal 2. Aspiration pneumonia , Acinetobacter, M.D.R -continue with minocycline, ID following, white blood cell count improving. 3. Status post CVA with hemiplegia  continue with aspirin and Lipitor 4. Hyponatremia , continue with D5 water 5. Acute kidney injury, improving monitor creatinine 6. Diabetes mellitus; not well controlled, continue with levemir & insulin sliding scale, decrease levemir to 10 units subcutaneous in a.m. and 14 units subcutaneous in the p.m. 7. Peripheral vascular disease , status post left BKA, continue with aspirin and Lipitor 8. Protein calorie malnutrition ; continue with tube feeding, Glucerna 1.5 9. 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

## 2013-10-18 LAB — POTASSIUM: Potassium: 5.1 mEq/L (ref 3.7–5.3)

## 2013-10-18 NOTE — Progress Notes (Signed)
[facility]                                                                                              Progress note     Patient Demographics  Robert [name], is a 58 y.o. male  WUJ:[id number]  [id number]  DOB - July 24, 1956  Admit date - 09/29/2013  Admitting Physician Robert Morrison, MD  Outpatient Primary MD for the patient is Robert Furnace, MD  LOS - 19   CC   respiratory failure          PCM         Generalized weakness      Subjective:   Robert [name] today cannot give any history due to neurostatus/encephalopathy`  Objective:   Vital signs  Temperature 98.1 Heart rate 89 Respiratory rate 20 Blood pressure 114/77 Pulse ox 94%    Exam Awake Alert, confused,  No new F.N deficits,  [place].AT, mild facial deviation noted Supple Neck,No JVD, No cervical lymphadenopathy appriciated , tracheostomy at midline , increased secretions.  Symmetrical Chest wall movement, decreased breath sounds at the bases with mild scattered rhonchi RRR,No Gallops,Rubs or new Murmurs, No Parasternal Heave +ve B.Sounds, Abd Soft, Non tender, No organomegaly appriciated, No rebound - guarding or rigidity, GJ-tube in place. No Cyanosis, Clubbing or edema, No new Rash or bruise  Status post left BKA     I&Os 3725/2850  Peg Tube/Glucerna 1.5 at 130 mL every 2 hours with H2O 150 mL every 2 hours  Foley yes  Trach yes  Data Review   CBC  Recent Labs Lab 10/12/13 0500 10/13/13 0930 10/15/13 0546 10/17/13 0615  WBC 12.2* 12.3* 9.8 10.7*  HGB 7.6* 8.4* 7.1* 7.7*  HCT 23.9* 26.6* 21.8* 24.1*  PLT 282 303 353 473*  MCV 76.8* 77.3* 77.0* 76.5*  MCH 24.4* 24.4* 25.1* 24.4*  MCHC 31.8 31.6 32.6 32.0  RDW 18.4* 18.8* 18.7* 18.4*    Chemistries   Recent Labs Lab 10/12/13 0500 10/13/13 0930 10/14/13 0500 10/15/13 0546 10/17/13 0615 10/18/13 0500  NA 152* 147  --  145  143  --   K 6.0* 5.1  --  5.0 5.4* 5.1  CL 113* 110  --  110 108  --   CO2 27 25  --  24 22  --   GLUCOSE 280* 246*  --  166* 123*  --   BUN 43* 39*  --  39* 45*  --   CREATININE 1.46* 1.45*  --  1.61* 1.47*  --   CALCIUM 7.9* 8.1*  --  7.9* 8.1*  --   MG  --   --  1.7  --   --   --     Coagulation profile No results found for this basename: INR, PROTIME,  in the last 168 hours  Assessment & Plan    1. Respiratory failure, status post tracheostomy;      Continue with ATC 28%, ventilator removed from room 2. Aspiration pneumonia , Acinetobacter, M.D.R -continue with minocycline, ID following, white blood cell count improving. 3. Status post CVA with hemiplegia continue with  aspirin and Lipitor 4. Hyponatremia , continue with D5 water 5. Acute kidney injury, improving monitor creatinine 6. Diabetes mellitus; not well controlled, continue with levemir & insulin sliding scale, decrease levemir to 10 units subcutaneous in a.m. and 14 units subcutaneous in the p.m. 7. Peripheral vascular disease , status post left BKA, continue with aspirin and Lipitor 8. Protein calorie malnutrition ; continue with tube feeding, Glucerna 1.5 9. 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 Hyperkalemia Plan Kayexalate Check BMP in a.m.  Code Status: Full     Consults  ID, PCCM  Antibiotics  minocycline  DVT Prophylaxis  heparin   Robert [name], Robert [name] M.D on 10/18/2013 at 12:45 PM

## 2013-10-19 LAB — BASIC METABOLIC PANEL
BUN: 47 mg/dL — AB (ref 6–23)
CO2: 24 meq/L (ref 19–32)
CREATININE: 1.31 mg/dL (ref 0.50–1.35)
Calcium: 8.2 mg/dL — ABNORMAL LOW (ref 8.4–10.5)
Chloride: 101 mEq/L (ref 96–112)
GFR calc non Af Amer: 59 mL/min — ABNORMAL LOW (ref >90)
GFR, EST AFRICAN AMERICAN: 69 mL/min — AB (ref >90)
Glucose, Bld: 219 mg/dL — ABNORMAL HIGH (ref 70–99)
Potassium: 5 mEq/L (ref 3.7–5.3)
Sodium: 137 mEq/L (ref 137–147)

## 2013-10-19 NOTE — Progress Notes (Signed)
[facility]                                                                                              Progress note     Patient Demographics  Robert [name], is a 58 y.o. male  ZOX:[id number]  [id number]:[id number]  DOB - 1955-11-15  Admit date - 09/29/2013  Admitting Physician Elnora Morrison, MD  Outpatient Primary MD for the patient is [name], MD  LOS - 20   CC   respiratory failure          PCM         Generalized weakness   Subjective:   [name] today cannot give any history due to neurostatus/encephalopathy`  Objective:   Vital signs  Temperature 97 Heart rate 78 Respiratory rate 18 Blood pressure 108/37 Pulse ox 98%  Exam Awake Alert, confused,  No new F.N deficits,  [place].AT, mild facial deviation noted Supple Neck,No JVD, No cervical lymphadenopathy appriciated , tracheostomy at midline , increased secretions.  Symmetrical Chest wall movement, decreased breath sounds at the bases with mild scattered rhonchi RRR,No Gallops,Rubs or new Murmurs, No Parasternal Heave +ve B.Sounds, Abd Soft, Non tender, No organomegaly appriciated, No rebound - guarding or rigidity, GJ-tube in place. No Cyanosis, Clubbing or edema, No new Rash or bruise  Status post left BKA     I&Os 3571/2700  Peg Tube/Glucerna 1.5 at 130 mL every 2 hours with H2O 150 mL every 2 hours  Foley yes  Trach #8 Shiley  Data Review   CBC  Recent Labs Lab 10/13/13 0930 10/15/13 0546 10/17/13 0615  WBC 12.3* 9.8 10.7*  HGB 8.4* 7.1* 7.7*  HCT 26.6* 21.8* 24.1*  PLT 303 353 473*  MCV 77.3* 77.0* 76.5*  MCH 24.4* 25.1* 24.4*  MCHC 31.6 32.6 32.0  RDW 18.8* 18.7* 18.4*    Chemistries   Recent Labs Lab 10/13/13 0930 10/14/13 0500 10/15/13 0546 10/17/13 0615 10/18/13 0500 10/19/13 0500  NA 147  --  145 143  --  137  K 5.1  --  5.0 5.4* 5.1 5.0  CL 110  --  110 108  --   101  CO2 25  --  24 22  --  24  GLUCOSE 246*  --  166* 123*  --  219*  BUN 39*  --  39* 45*  --  47*  CREATININE 1.45*  --  1.61* 1.47*  --  1.31  CALCIUM 8.1*  --  7.9* 8.1*  --  8.2*  MG  --  1.7  --   --   --   --     Coagulation profile No results found for this basename: INR, PROTIME,  in the last 168 hours  Assessment & Plan    1. Respiratory failure, status post tracheostomy; clinically fair     Continue with ATC 28%, ventilator removed from room 2. Aspiration pneumonia , Acinetobacter, M.D.R -continue with minocycline, ID following, white blood cell count improving. 3. Status post CVA with hemiplegia continue with aspirin and Lipitor 4. Hyponatremia , continue with D5 water 5. Acute kidney  injury, improving monitor creatinine 6. Diabetes mellitus; not well controlled, continue with levemir & insulin sliding scale, decrease levemir to 10 units subcutaneous in a.m. and 14 units subcutaneous in the p.m. 7. Peripheral vascular disease , status post left BKA, continue with aspirin and Lipitor 8. Protein calorie malnutrition ; continue with tube feeding, Glucerna 1.5 9. 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 Hyperkalemia status post Kayexalate Plan Check CBC and portable chest x-ray in am  Code Status: Full     Consults  ID, PCCM  Antibiotics  minocycline  DVT Prophylaxis  heparin   Carron CurieHijazi, Kida Digiulio M.D on 10/19/2013 at 2:50 PM

## 2013-10-20 ENCOUNTER — Other Ambulatory Visit (HOSPITAL_COMMUNITY): Payer: Medicare Other

## 2013-10-20 DIAGNOSIS — R1310 Dysphagia, unspecified: Secondary | ICD-10-CM

## 2013-10-20 LAB — CBC
HCT: 23.9 % — ABNORMAL LOW (ref 39.0–52.0)
Hemoglobin: 7.7 g/dL — ABNORMAL LOW (ref 13.0–17.0)
MCH: 24.4 pg — ABNORMAL LOW (ref 26.0–34.0)
MCHC: 32.2 g/dL (ref 30.0–36.0)
MCV: 75.9 fL — AB (ref 78.0–100.0)
Platelets: 731 10*3/uL — ABNORMAL HIGH (ref 150–400)
RBC: 3.15 MIL/uL — ABNORMAL LOW (ref 4.22–5.81)
RDW: 18.3 % — AB (ref 11.5–15.5)
WBC: 10.6 10*3/uL — ABNORMAL HIGH (ref 4.0–10.5)

## 2013-10-20 NOTE — Progress Notes (Signed)
PULMONARY  / CRITICAL CARE MEDICINE  Name: Robert BushyDouglas J [name] MRN: [id number] DOB: Jun 22, 1956  CONSULTATION DATE: 09/30/2013  BRIEF PATIENT DESCRIPTION: 58 y/o with h/o CVA, dysphagia, aspiration pneumonia and multiple prior intubations [facility].  CULTURES: BAL 2/18>>>yeast/candida BCx2 2/24>>>neg UC 2/24>>>neg Tracheal Asp 2/23>>>neg Tracheal Asp 2/28>>> Acinetobacter (R to everything tested, was not tested for minocycline)  KEY EVENTS 2/18 - Trach (KZ) 2/20 - atc 3 hours 2/25 - ATC for 48 hours, fatigued and back on vent, ABG 30 min post vent good 2/27 - weaning on 40% ATC, tolerating well 3/02 - seizure and placed back on full support ventilation  3-9 on t-collar >48 hours  [facility]  EXAM General:  Appears chronically ill, off vent Neuro:  Encephalopathic, nonfocal, cough / gag +, no follows commands HEENT:  PERRL, #8 trach midline c/d/i Cardiovascular:  RRR, no m/r/g Lungs:  Bilateral diminished bs bases Abdomen:  Soft, nontender, bowel sounds diminished Musculoskeletal:  No edema Skin:  No rash  LABS:  Recent Labs Lab 10/15/13 0546 10/17/13 0615 10/18/13 0500 10/19/13 0500  NA 145 143  --  137  K 5.0 5.4* 5.1 5.0  CL 110 108  --  101  CO2 24 22  --  24  BUN 39* 45*  --  47*  CREATININE 1.61* 1.47*  --  1.31  GLUCOSE 166* 123*  --  219*    Recent Labs Lab 10/15/13 0546 10/17/13 0615 10/20/13 0500  HGB 7.1* 7.7* 7.7*  HCT 21.8* 24.1* 23.9*  WBC 9.8 10.7* 10.6*  PLT 353 473* 731*   Vital signs reviewed. Abnormal values will appear under impression plan section.   IMAGING: Dg Chest Port 1 View  10/20/2013   CLINICAL DATA:  Respiratory failure.  EXAM: PORTABLE CHEST - 1 VIEW  COMPARISON:  10/17/2013  FINDINGS: PICC line tip in the right subclavian region and unchanged. No change in the diffuse interstitial lung markings. Heart size is grossly stable. Increased densities at the left lung base may represent volume  loss. Negative for pneumothorax. Prominent right paratracheal densities are compatible with known lymphadenopathy. Tracheostomy tube is present.  IMPRESSION: Increased densities at the left lung base.  Stable support apparatuses.   Electronically Signed   By: Richarda OverlieAdam  Henn M.D.   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 His trach should never come out again > needs for airway protection -Trach care per protocol, sutures out -Scheduled BD's -Mobilize as able -PPI / Heparin  -Follow Cultures -ABX per primary; doubt Acinetobacter will be able to be eradicated -PCCM see weekly on mondays.    Brett CanalesSteve Minor ACNP [facility] Pager [phone] till 3 pm If no answer page [phone] 10/20/2013, 9:58 AM  Levy Pupaobert Layah Skousen, MD, PhD 10/20/2013, 11:19 AM [facility] [phone] or if no answer [phone]

## 2013-10-20 NOTE — Progress Notes (Signed)
[facility]                                                                                              Progress note     Patient Demographics  Robert [name], is a 58 y.o. male  ZOX:[id number]  [id number]  DOB - 10-20-1955  Admit date - 09/29/2013  Admitting Physician Elnora Morrison, MD  Outpatient Primary MD for the patient is Robert Furnace, MD  LOS - 21   CC   respiratory failure          PCM         Generalized weakness   Subjective:   [name] today cannot give any history due to neurostatus/encephalopathy`  Objective:   Vital signs  Temperature 97 Heart rate 7120 Respiratory rate 18 Blood pressure 146/54 Pulse ox 100%  Exam Awake Alert, confused,  No new F.N deficits,  [place].AT, mild facial deviation noted Supple Neck,No JVD, No cervical lymphadenopathy appriciated , tracheostomy at midline , increased secretions.  Symmetrical Chest wall movement, decreased breath sounds at the bases with mild scattered rhonchi RRR,No Gallops,Rubs or new Murmurs, No Parasternal Heave +ve B.Sounds, Abd Soft, Non tender, No organomegaly appriciated, No rebound - guarding or rigidity, GJ-tube in place. No Cyanosis, Clubbing or edema, No new Rash or bruise  Status post left BKA     I&Os 3447/3275  Peg Tube/Glucerna 1.5 at 130 mL every 2 hours with H2O 150 mL every 2 hours  Foley yes  Trach #8 Shiley  Data Review   CBC  Recent Labs Lab 10/15/13 0546 10/17/13 0615 10/20/13 0500  WBC 9.8 10.7* 10.6*  HGB 7.1* 7.7* 7.7*  HCT 21.8* 24.1* 23.9*  PLT 353 473* 731*  MCV 77.0* 76.5* 75.9*  MCH 25.1* 24.4* 24.4*  MCHC 32.6 32.0 32.2  RDW 18.7* 18.4* 18.3*    Chemistries   Recent Labs Lab 10/14/13 0500 10/15/13 0546 10/17/13 0615 10/18/13 0500 10/19/13 0500  NA  --  145 143  --  137  K  --  5.0 5.4* 5.1 5.0  CL  --  110 108  --  101  CO2  --  24 22  --   24  GLUCOSE  --  166* 123*  --  219*  BUN  --  39* 45*  --  47*  CREATININE  --  1.61* 1.47*  --  1.31  CALCIUM  --  7.9* 8.1*  --  8.2*  MG 1.7  --   --   --   --     Coagulation profile No results found for this basename: INR, PROTIME,  in the last 168 hours  Assessment & Plan    1. Respiratory failure, status post tracheostomy; no new problems     Continue with ATC 28%, ventilator removed from room 2. Aspiration pneumonia , Acinetobacter, M.D.R -continue with minocycline, ID following, white blood cell count improving. 3. Status post CVA with hemiplegia mental status improving 4. Hypernatremia , continue with D5 water, sodium 137 5. Acute kidney injury, improving monitor creatinine 6. Diabetes mellitus; not well controlled, continue with  levemir & insulin sliding scale, decrease levemir to 10 units subcutaneous in a.m. and 14 units subcutaneous in the p.m. 7. Peripheral vascular disease , status post left BKA, continue with aspirin and Lipitor 8. Protein calorie malnutrition ; continue with tube feeding, Glucerna 1.5 9. 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 Hyperkalemia status post Kayexalate Plan Continue same medications  Code Status: Full     Consults  ID, PCCM  Antibiotics  minocycline  DVT Prophylaxis  heparin   Carron CurieHijazi, Delphin Funes M.D on 10/20/2013 at 12:14 PM

## 2013-10-22 NOTE — Progress Notes (Signed)
[facility]                                                                                              Progress note     Patient Demographics  Robert [name], is a 58 y.o. male  ZOX:[id number]  WJX:[id number]  DOB - 11-21-1955  Admit date - 09/29/2013  Admitting Physician Elnora MorrisonAhmad B Barakat, MD  Outpatient Primary MD for the patient is [name], ROBERTO, MD  LOS - 23   CC   respiratory failure          PCM         Generalized weakness   Subjective:   Robert Lightningouglas Grupp today cannot give any history due to neurostatus/encephalopathy`  Objective:   Vital signs  Temperature 96.4 Heart rate 86 Respiratory rate 20 Blood pressure 141/57 Pulse ox 100%  Exam Awake Alert, confused,  No new F.N deficits,  [place].AT, mild facial deviation noted Supple Neck,No JVD, No cervical lymphadenopathy appriciated , tracheostomy at midline , increased secretions.  Symmetrical Chest wall movement, decreased breath sounds at the bases with mild scattered rhonchi RRR,No Gallops,Rubs or new Murmurs, No Parasternal Heave +ve B.Sounds, Abd Soft, Non tender, No organomegaly appriciated, No rebound - guarding or rigidity, GJ-tube in place. No Cyanosis, Clubbing or edema, No new Rash or bruise  Status post left BKA     I&Os stool 2453/2000  Peg Tube/Glucerna 1.5 at 130 mL every 2 hours with H2O 150 mL every 2 hours  Foley yes  Trach #8 [place]iley  Data Review   CBC  Recent Labs Lab 10/17/13 0615 10/20/13 0500  WBC 10.7* 10.6*  HGB 7.7* 7.7*  HCT 24.1* 23.9*  PLT 473* 731*  MCV 76.5* 75.9*  MCH 24.4* 24.4*  MCHC 32.0 32.2  RDW 18.4* 18.3*    Chemistries   Recent Labs Lab 10/17/13 0615 10/18/13 0500 10/19/13 0500  NA 143  --  137  K 5.4* 5.1 5.0  CL 108  --  101  CO2 22  --  24  GLUCOSE 123*  --  219*  BUN 45*  --  47*  CREATININE 1.47*  --  1.31  CALCIUM 8.1*  --  8.2*     Coagulation profile No results found for this basename: INR, PROTIME,  in the last 168 hours  Assessment & Plan    1. Respiratory failure, status post tracheostomy; no new problems     Continue with ATC 28%, ventilator removed from room 2. Aspiration pneumonia , Acinetobacter, M.D.R -continue with minocycline, ID following, white blood cell count improving. Chest x-ray on 3/9 shows worsening left lower lobe pneumonia 3. Status post CVA with hemiplegia mental status improving 4. Hypernatremia , continue with D5 water, sodium 137 5. Acute kidney injury, improving , less creatinine 1.31 6. Diabetes mellitus; not well controlled, continue with levemir & insulin sliding scale, decrease levemir to 10 units subcutaneous in a.m. and 14 units subcutaneous in the p.m. 7. Peripheral vascular disease , status post left BKA, continue with aspirin and Lipitor 8. Protein calorie malnutrition ; continue with tube feeding, Glucerna 1.5 9. 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 Hyperkalemia status post Kayexalate Plan Check CBC/ BMP in a.m.  Code Status: Full     Consults  ID, PCCM  Antibiotics  minocycline  DVT Prophylaxis  heparin   Carron Curie M.D on 10/22/2013 at 11:17 AM

## 2013-10-23 LAB — BASIC METABOLIC PANEL
BUN: 50 mg/dL — ABNORMAL HIGH (ref 6–23)
CALCIUM: 8.6 mg/dL (ref 8.4–10.5)
CO2: 26 mEq/L (ref 19–32)
Chloride: 101 mEq/L (ref 96–112)
Creatinine, Ser: 1.3 mg/dL (ref 0.50–1.35)
GFR calc Af Amer: 69 mL/min — ABNORMAL LOW (ref >90)
GFR, EST NON AFRICAN AMERICAN: 60 mL/min — AB (ref >90)
Glucose, Bld: 233 mg/dL — ABNORMAL HIGH (ref 70–99)
Potassium: 5 mEq/L (ref 3.7–5.3)
Sodium: 138 mEq/L (ref 137–147)

## 2013-10-23 LAB — CBC
HCT: 23.6 % — ABNORMAL LOW (ref 39.0–52.0)
Hemoglobin: 7.6 g/dL — ABNORMAL LOW (ref 13.0–17.0)
MCH: 24.9 pg — AB (ref 26.0–34.0)
MCHC: 32.2 g/dL (ref 30.0–36.0)
MCV: 77.4 fL — ABNORMAL LOW (ref 78.0–100.0)
PLATELETS: 784 10*3/uL — AB (ref 150–400)
RBC: 3.05 MIL/uL — ABNORMAL LOW (ref 4.22–5.81)
RDW: 19.3 % — ABNORMAL HIGH (ref 11.5–15.5)
WBC: 8.9 10*3/uL (ref 4.0–10.5)

## 2013-10-26 ENCOUNTER — Other Ambulatory Visit (HOSPITAL_COMMUNITY): Payer: Medicare Other

## 2013-10-26 LAB — BLOOD GAS, ARTERIAL
Acid-Base Excess: 2.1 mmol/L — ABNORMAL HIGH (ref 0.0–2.0)
Bicarbonate: 26.7 mEq/L — ABNORMAL HIGH (ref 20.0–24.0)
FIO2: 28 %
O2 Saturation: 98.9 %
PCO2 ART: 45.3 mmHg — AB (ref 35.0–45.0)
PO2 ART: 105 mmHg — AB (ref 80.0–100.0)
Patient temperature: 98
TCO2: 28.1 mmol/L (ref 0–100)
pH, Arterial: 7.386 (ref 7.350–7.450)

## 2013-10-26 LAB — BASIC METABOLIC PANEL
BUN: 55 mg/dL — ABNORMAL HIGH (ref 6–23)
CHLORIDE: 99 meq/L (ref 96–112)
CO2: 24 meq/L (ref 19–32)
Calcium: 8.7 mg/dL (ref 8.4–10.5)
Creatinine, Ser: 1.37 mg/dL — ABNORMAL HIGH (ref 0.50–1.35)
GFR calc non Af Amer: 56 mL/min — ABNORMAL LOW (ref >90)
GFR, EST AFRICAN AMERICAN: 65 mL/min — AB (ref >90)
Glucose, Bld: 252 mg/dL — ABNORMAL HIGH (ref 70–99)
Potassium: 5.1 mEq/L (ref 3.7–5.3)
Sodium: 138 mEq/L (ref 137–147)

## 2013-10-26 LAB — CBC
HCT: 26.3 % — ABNORMAL LOW (ref 39.0–52.0)
Hemoglobin: 8.4 g/dL — ABNORMAL LOW (ref 13.0–17.0)
MCH: 24.8 pg — ABNORMAL LOW (ref 26.0–34.0)
MCHC: 31.9 g/dL (ref 30.0–36.0)
MCV: 77.6 fL — ABNORMAL LOW (ref 78.0–100.0)
PLATELETS: 677 10*3/uL — AB (ref 150–400)
RBC: 3.39 MIL/uL — AB (ref 4.22–5.81)
RDW: 19.3 % — ABNORMAL HIGH (ref 11.5–15.5)
WBC: 10.1 10*3/uL (ref 4.0–10.5)

## 2013-10-27 DIAGNOSIS — A419 Sepsis, unspecified organism: Secondary | ICD-10-CM

## 2013-10-27 NOTE — Progress Notes (Signed)
PULMONARY  / CRITICAL CARE MEDICINE  Name: Robert [name] MRN: [id number] DOB: 1955-12-17  CONSULTATION DATE: 09/30/2013  BRIEF PATIENT DESCRIPTION: 58 y/o with h/o CVA, dysphagia, aspiration pneumonia and multiple prior intubations readmitted to [facility] on 2/16 with VDRF.  CULTURES: BAL 2/18>>>yeast/candida BCx2 2/24>>>neg UC 2/24>>>neg Tracheal Asp 2/23>>>neg Tracheal Asp 2/28>>> Acinetobacter (R to everything tested, was not tested for minocycline)  KEY EVENTS 2/18 - Trach (KZ) 2/20 - atc 3 hours 2/25 - ATC for 48 hours, fatigued and back on vent, ABG 30 min post vent good 2/27 - weaning on 40% ATC, tolerating well 3/02 - seizure and placed back on full support ventilation  3-9 on t-collar >48 hours 3/16 off vent x 1 week [facility]  EXAM General:  Appears chronically ill, off vent Neuro:  Follows commands cough / gag +,  HEENT:  PERRL, #8 trach midline c/d/i Cardiovascular:  RRR, no m/r/g Lungs:  Bilateral diminished bs bases Abdomen:  Soft, nontender, bowel sounds diminished Musculoskeletal:  No edema Skin:  No rash  LABS:  Recent Labs Lab 10/23/13 0500 10/26/13 0635  NA 138 138  K 5.0 5.1  CL 101 99  CO2 26 24  BUN 50* 55*  CREATININE 1.30 1.37*  GLUCOSE 233* 252*    Recent Labs Lab 10/23/13 0500 10/26/13 0635  HGB 7.6* 8.4*  HCT 23.6* 26.3*  WBC 8.9 10.1  PLT 784* 677*   Vital signs reviewed. Abnormal values will appear under impression plan section.   IMAGING: Dg Chest Port 1 View  10/26/2013   CLINICAL DATA:  Left lower lobe airspace disease  EXAM: PORTABLE CHEST - 1 VIEW  COMPARISON:  DG CHEST 1V PORT dated 10/20/2013; DG CHEST 1V PORT dated 10/01/2013  FINDINGS: Tracheostomy tube is appropriately positioned. Right-sided PICC line tip terminates over the brachiocephalic vein. Patchy multilobar airspace opacity with increased reticular markings is reidentified bilaterally with improved aeration at the left lung base. Heart size is at  upper limits of normal. Trace pleural effusions are noted.  IMPRESSION: Improving left lower lobe aeration with otherwise stable patchy airspace opacities which could represent atypical infection, edema superimposed on underlying lung disease, or other alveolar filling process.   Electronically Signed   By: Christiana PellantGretchen  Green M.D.   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 His trach should never come out again > needs for airway protection -Trach care per protocol, sutures out -Scheduled BD's -Mobilize as able -PPI / Heparin  -Follow Cultures -ABX per primary; doubt Acinetobacter will be able to be eradicated -PCCM see weekly on mondays.   Robert [name] ACNP Robert [name] PCCM Pager [phone] till 3 pm If no answer page [phone] 10/27/2013, 9:21 AM  ATC for two weeks, no decannulation, would change to cuffless 6.  Patient seen and examined, agree with above note.  I dictated the care and orders written for this patient under my direction.  Robert ReedyWesam G Linlee Cromie, MD [phone]

## 2013-10-27 NOTE — Progress Notes (Addendum)
[facility]                                                                                              Progress note     Patient Demographics  Esaw Knippel, is a 58 y.o. male  ZOX:[id number]  [id number]:[id number]  DOB - July 29, 1956  Admit date - 09/29/2013  Admitting Physician Elnora Morrison, MD  Outpatient Primary MD for the patient is [name], MD  LOS - 28   CC   respiratory failure          PCM         Generalized weakness   Subjective:   [name] today cannot give any history due to neurostatus/encephalopathy`  Objective:   Vital signs  Temperature 97 7 Heart rate 74 Respiratory rate 16 Blood pressure 151/75 Pulse ox 100%  Exam Awake Alert, confused,  No new F.N deficits,  [place].AT, mild facial deviation noted Supple Neck,No JVD, No cervical lymphadenopathy appriciated , tracheostomy at midline , increased secretions.  Symmetrical Chest wall movement, decreased breath sounds at the bases with mild scattered rhonchi RRR,No Gallops,Rubs or new Murmurs, No Parasternal Heave +ve B.Sounds, Abd Soft, Non tender, No organomegaly appriciated, No rebound - guarding or rigidity, GJ-tube in place. No Cyanosis, Clubbing or edema, No new Rash or bruise  Status post left BKA     I&Os stool 3002/2200  Peg Tube/Glucerna 1.5 at 130 mL every 2 hours with H2O 150 mL every 2 hours  Foley yes  Trach #6 cuffless  Data Review   CBC  Recent Labs Lab 10/23/13 0500 10/26/13 0635  WBC 8.9 10.1  HGB 7.6* 8.4*  HCT 23.6* 26.3*  PLT 784* 677*  MCV 77.4* 77.6*  MCH 24.9* 24.8*  MCHC 32.2 31.9  RDW 19.3* 19.3*    Chemistries   Recent Labs Lab 10/23/13 0500 10/26/13 0635  NA 138 138  K 5.0 5.1  CL 101 99  CO2 26 24  GLUCOSE 233* 252*  BUN 50* 55*  CREATININE 1.30 1.37*  CALCIUM 8.6 8.7    Coagulation profile No results found for this basename: INR,  PROTIME,  in the last 168 hours  Assessment & Plan    1. Respiratory failure, status post tracheostomy; no new problems     Continue with ATC 28%, ventilator removed from room     Trach downsized to a #6 cuffless , chest x-ray improving on 3/15 2. Aspiration pneumonia , Acinetobacter, M.D.R -continue with minocycline, ID following, white blood cell count improving. Chest x-ray on 3/9 shows worsening left lower lobe pneumonia 3. Status post CVA with hemiplegia mental status improving 4. Hypernatremia , resolved 5. Acute kidney injury, resolved 6. Diabetes mellitus; not well controlled, continue with levemir & insulin sliding scale, increase Levemir to 15 units twice a day  7. Peripheral vascular disease , status post left BKA, continue with aspirin and Lipitor 8. Protein calorie malnutrition ; continue with tube feeding, Glucerna 1.5 9. 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 Hyperkalemia status post Kayexalate Plan DC Foley  Increased levemir  to 15 units subcutaneous twice a day D/C  Flexiseal  DC PICC line  Code Status: Full     Consults  ID, PCCM  Antibiotics  minocycline  DVT Prophylaxis  heparin   Carron CurieHijazi, Fizza Scales M.D on 10/27/2013 at 5:09 PM

## 2013-10-28 NOTE — Progress Notes (Signed)
[facility]                                                                                              Progress note     Patient Demographics  [name], is a 58 y.o. male  GNF:[id number]  [id number]  DOB - 06-09-1956  Admit date - 09/29/2013  Admitting Physician Elnora MorrisonAhmad B Barakat, MD  Outpatient Primary MD for the patient is Robert FurnaceSANCHEZ GUTIERREZ, ROBERTO, MD  LOS - 29   CC   respiratory failure          PCM         Generalized weakness   Subjective:   Unk Lightningouglas Murcia today cannot give any history due to neurostatus/encephalopathy`  Objective:   Vital signs  Temperature 96 Heart rate 82 Respiratory rate 16 Blood pressure 151/75 Pulse ox 100%  Exam Awake Alert, confused,  No new F.N deficits,  [place].AT, mild facial deviation noted Supple Neck,No JVD, No cervical lymphadenopathy appriciated , tracheostomy at midline , increased secretions.  Symmetrical Chest wall movement, decreased breath sounds at the bases with mild scattered rhonchi RRR,No Gallops,Rubs or new Murmurs, No Parasternal Heave +ve B.Sounds, Abd Soft, Non tender, No organomegaly appriciated, No rebound - guarding or rigidity, GJ-tube in place. No Cyanosis, Clubbing or edema, No new Rash or bruise  Status post left BKA     I&Os stool 2270/2550 Peg Tube/Glucerna 1.5 at 130 mL every 2 hours with H2O 150 mL every 2 hours Foley yes Trach #6 cuffless  Data Review   CBC  Recent Labs Lab 10/23/13 0500 10/26/13 0635  WBC 8.9 10.1  HGB 7.6* 8.4*  HCT 23.6* 26.3*  PLT 784* 677*  MCV 77.4* 77.6*  MCH 24.9* 24.8*  MCHC 32.2 31.9  RDW 19.3* 19.3*    Chemistries   Recent Labs Lab 10/23/13 0500 10/26/13 0635  NA 138 138  K 5.0 5.1  CL 101 99  CO2 26 24  GLUCOSE 233* 252*  BUN 50* 55*  CREATININE 1.30 1.37*  CALCIUM 8.6 8.7    Coagulation profile No results found for this basename: INR, PROTIME,  in  the last 168 hours  Assessment & Plan    1. Respiratory failure, status post tracheostomy; no new problems     Continue with ATC 28%, ventilator removed from room     Trach downsized to a #6 cuffless , chest x-ray improving on 3/15 2. Aspiration pneumonia , Acinetobacter, M.D.R -continue with minocycline, ID following, white blood cell count improving. Chest x-ray on 3/9 shows worsening left lower lobe pneumonia 3. Status post CVA with hemiplegia mental status improving 4. Hypernatremia , resolved 5. Acute kidney injury, resolved 6. Diabetes mellitus; blood sugar 165/156/106  7. Peripheral vascular disease , status post left BKA, continue with aspirin and Lipitor 8. Protein calorie malnutrition ; continue with tube feeding, Vital1.5 9. 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 Hyperkalemia status post Kayexalate Plan Check labs in a.m. Code Status: Full     Consults  ID, PCCM  Antibiotics  minocycline  DVT  Prophylaxis  heparin   Carron Curie M.D on 10/28/2013 at 10:50 AM

## 2013-10-29 ENCOUNTER — Other Ambulatory Visit (HOSPITAL_COMMUNITY): Payer: Medicare Other

## 2013-10-29 DIAGNOSIS — I6350 Cerebral infarction due to unspecified occlusion or stenosis of unspecified cerebral artery: Secondary | ICD-10-CM

## 2013-10-29 LAB — CBC
HCT: 25.2 % — ABNORMAL LOW (ref 39.0–52.0)
Hemoglobin: 7.9 g/dL — ABNORMAL LOW (ref 13.0–17.0)
MCH: 24.3 pg — AB (ref 26.0–34.0)
MCHC: 31.3 g/dL (ref 30.0–36.0)
MCV: 77.5 fL — ABNORMAL LOW (ref 78.0–100.0)
PLATELETS: 532 10*3/uL — AB (ref 150–400)
RBC: 3.25 MIL/uL — AB (ref 4.22–5.81)
RDW: 18.7 % — AB (ref 11.5–15.5)
WBC: 9.2 10*3/uL (ref 4.0–10.5)

## 2013-10-29 LAB — BASIC METABOLIC PANEL
BUN: 56 mg/dL — ABNORMAL HIGH (ref 6–23)
CALCIUM: 8.6 mg/dL (ref 8.4–10.5)
CO2: 29 meq/L (ref 19–32)
CREATININE: 1.34 mg/dL (ref 0.50–1.35)
Chloride: 102 mEq/L (ref 96–112)
GFR calc Af Amer: 67 mL/min — ABNORMAL LOW (ref >90)
GFR, EST NON AFRICAN AMERICAN: 58 mL/min — AB (ref >90)
Glucose, Bld: 169 mg/dL — ABNORMAL HIGH (ref 70–99)
Potassium: 4.7 mEq/L (ref 3.7–5.3)
SODIUM: 140 meq/L (ref 137–147)

## 2013-10-29 NOTE — Progress Notes (Addendum)
[facility]                                                                                              Progress note     Patient Demographics  Unk LightningDouglas Montijo, is a 58 y.o. male  EAV:[id number]  [id number]  DOB - Oct 14, 1955  Admit date - 09/29/2013  Admitting Physician Elnora MorrisonAhmad B Barakat, MD  Outpatient Primary MD for the patient is [name], ROBERTO, MD  LOS - 30   CC   respiratory failure          PCM         Generalized weakness   Subjective:   Unk Lightningouglas Samford today cannot give any history due to neurostatus/encephalopathy`the patient was very anxious yesterday and received PRN Ativan and Haldol .  Objective:   Vital signs  Temperature 97.2 Heart rate 76 Respiratory rate 15 Blood pressure 101/54 Pulse ox 100%  Exam Awake Alert, confused,  No new F.N deficits,  [place].AT, mild facial deviation noted Supple Neck,No JVD, No cervical lymphadenopathy appriciated , tracheostomy at midline , increased secretions.  Symmetrical Chest wall movement, decreased breath sounds at the bases with mild scattered rhonchi RRR,No Gallops,Rubs or new Murmurs, No Parasternal Heave +ve B.Sounds, Abd Soft, Non tender, No organomegaly appriciated, No rebound - guarding or rigidity, GJ-tube in place. No Cyanosis, Clubbing or edema, No new Rash or bruise  Status post left BKA     I&Os stool 1880/1 440 Peg Tube/Vital 1.5 Foley yes Trach #6 cuffless  Data Review   CBC  Recent Labs Lab 10/23/13 0500 10/26/13 0635 10/29/13 0550  WBC 8.9 10.1 9.2  HGB 7.6* 8.4* 7.9*  HCT 23.6* 26.3* 25.2*  PLT 784* 677* 532*  MCV 77.4* 77.6* 77.5*  MCH 24.9* 24.8* 24.3*  MCHC 32.2 31.9 31.3  RDW 19.3* 19.3* 18.7*    Chemistries   Recent Labs Lab 10/23/13 0500 10/26/13 0635 10/29/13 0550  NA 138 138 140  K 5.0 5.1 4.7  CL 101 99 102  CO2 26 24 29   GLUCOSE 233* 252* 169*  BUN 50* 55* 56*   CREATININE 1.30 1.37* 1.34  CALCIUM 8.6 8.7 8.6    Chest x-ray 3/18     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 Respiratory failure, status post tracheostomy; no new problems     Continue with ATC 28%, ventilator removed from room     Trach downsized to a #6 cuffless , chest x-ray ARDS on 10/29/2013  2. Aspiration pneumonia , Acinetobacter, M.D.R - off minocycline  3. Status post CVA with hemiplegia mental status improving 4. Hypernatremia , resolved 5. Acute kidney injury, resolved 6. Diabetes mellitus   7. Peripheral vascular disease , status post left BKA, continue with aspirin and Lipitor 8. Protein calorie malnutrition ; continue with tube feeding, Vital1.5 9. 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

## 2013-10-29 NOTE — Progress Notes (Signed)
PULMONARY  / CRITICAL CARE MEDICINE  Name: Robert BushyDouglas J [name] MRN: [id number] DOB: 08-15-1955  CONSULTATION DATE: 09/30/2013  BRIEF PATIENT DESCRIPTION: 58 y/o with h/o CVA, dysphagia, aspiration pneumonia and multiple prior int[facility]ations [facility]  CULTURES: BAL 2/18>>>yeast/candida BCx2 2/24>>>neg UC 2/24>>>neg Tracheal Asp 2/23>>>neg Tracheal Asp 2/28>>> Acinetobacter (R to everything tested, was not tested for minocycline)  KEY EVENTS 2/18 - Trach (KZ) 2/20 - atc 3 hours 2/25 - ATC for 48 hours, fatigued and back on vent, ABG 30 min post vent good 2/27 - weaning on 40% ATC, tolerating well 3/02 - seizure and placed back on full support ventilation  3-9 on t-collar >48 hours 3/16 off vent x 1 week [facility]OTICS[facility]  EXAM General:  Appears chronically ill, off vent Neuro:  Follows commands cough / gag +,  HEENT:  PERRL, #8 trach midline c/d/i Cardiovascular:  RRR, no m/r/g Lungs:  Bilateral diminished bs bases Abdomen:  Soft, nontender, bowel sounds diminished Musculoskeletal:  No edema Skin:  No rash  LABS:  Recent Labs Lab 10/23/13 0500 10/26/13 0635 10/29/13 0550  NA 138 138 140  K 5.0 5.1 4.7  CL 101 99 102  CO2 26 24 29   BUN 50* 55* 56*  CREATININE 1.30 1.37* 1.34  GLUCOSE 233* 252* 169*    Recent Labs Lab 10/23/13 0500 10/26/13 0635 10/29/13 0550  HGB 7.6* 8.4* 7.9*  HCT 23.6* 26.3* 25.2*  WBC 8.9 10.1 9.2  PLT 784* 677* 532*   Vital signs reviewed. Abnormal values will appear under impression plan section.   IMAGING: Dg Chest Port 1 View  10/29/2013   CLINICAL DATA:  Respiratory failure.  EXAM: PORTABLE CHEST - 1 VIEW  COMPARISON:  DG CHEST 1V PORT dated 10/26/2013; DG CHEST 1V PORT dated 10/13/2013; DG CHEST 1V PORT dated 09/25/2013; DG CHEST 1V PORT dated 09/22/2013; CT CHEST W/O CM dated 08/01/2013  FINDINGS: Tracheostomy is midline. Heart size stable. Right PICC terminates in the region of the right subclavian vein.  Added density in the right paratracheal region is due to a right-sided aortic arch. There is mixed interstitial and airspace disease, with a slight overall coarsened appearance of the pulmonary parenchyma. Improved aeration in the right upper lobe in the interval. Small left pleural effusion.  IMPRESSION: 1. Diffuse mixed interstitial airspace disease, somewhat basilar predominant, with interval improvement in right upper lobe aeration. 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 2. Right PICC tip projects in the region the right subclavian vein. 3. Small left pleural effusion.   Electronically Signed   By: Leanna BattlesMelinda  Blietz M.D.   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  - His trach should never come out again > needs for airway protection -Trach care per protocol, sutures out -Scheduled BD's -Mobilize as able, restricted by CVA -PPI / Heparin  -Follow Cultures -ABX per primary; doubt Acinetobacter will be able to be eradicated -PCCM will sign off, please call back if needed.   Alyson ReedyWesam G. Buckley Bradly, M.D. [facility]. Pager: [phone]. After hours pager: [phone].

## 2013-10-30 NOTE — Progress Notes (Signed)
[facility]                                                                                              Progress note     Patient Demographics  Robert [name], is a 58 y.o. male  ZOX:[id number]  [id number]:[id number]  DOB - April 27, 1956  Admit date - 09/29/2013  Admitting Physician Elnora Morrison, MD  Outpatient Primary MD for the patient is Robert Furnace, MD  LOS - 31   CC   respiratory failure          PCM         Generalized weakness   Subjective:   [name] today cannot give any history due to neurostatus/encephalopathy`the patient was very anxious yesterday and received PRN Ativan and Haldol .  Objective:   Vital signs  Temperature 97.6 Heart rate 86 Respiratory rate 16 Blood pressure 116/50 Pulse ox 97%  Exam Awake Alert, confused,  No new F.N deficits,  [place].AT, mild facial deviation noted Supple Neck,No JVD, No cervical lymphadenopathy appriciated , tracheostomy at midline , increased secretions.  Symmetrical Chest wall movement, decreased breath sounds at the bases with mild scattered rhonchi RRR,No Gallops,Rubs or new Murmurs, No Parasternal Heave +ve B.Sounds, Abd Soft, Non tender, No organomegaly appriciated, No rebound - guarding or rigidity, GJ-tube in place. No Cyanosis, Clubbing or edema, No new Rash or bruise  Status post left BKA     I&Os 2070/850 Peg Tube/Vital 1.5 Foley yes Trach #6 cuffless  Data Review   CBC  Recent Labs Lab 10/26/13 0635 10/29/13 0550  WBC 10.1 9.2  HGB 8.4* 7.9*  HCT 26.3* 25.2*  PLT 677* 532*  MCV 77.6* 77.5*  MCH 24.8* 24.3*  MCHC 31.9 31.3  RDW 19.3* 18.7*    Chemistries   Recent Labs Lab 10/26/13 0635 10/29/13 0550  NA 138 140  K 5.1 4.7  CL 99 102  CO2 24 29  GLUCOSE 252* 169*  BUN 55* 56*  CREATININE 1.37* 1.34  CALCIUM 8.7 8.6    Chest x-ray 3/18     diffuse mixed interstitial air space  disease with post inflammatory fibrosis      Assessment & Plan    1. Respiratory failure, status post tracheostomy; no new problems     Continue with ATC 28%, ventilator removed from room     Trach downsized to a #6 cuffless , chest x-ray ARDS on 10/29/2013  2. Aspiration pneumonia , Acinetobacter, M.D.R - off minocycline  3. Status post CVA with hemiplegia mental status improving 4. Hypernatremia , resolved 5. Acute kidney injury, resolved 6. Diabetes mellitus   7. Peripheral vascular disease , status post left BKA, continue with aspirin and Lipitor 8. Protein calorie malnutrition ; continue with tube feeding, Vital1.5 9. 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  Code Status: Full     Consults  ID, PCCM  Antibiotics  None  DVT Prophylaxis  heparin  Carron CurieHijazi, Kalandra Masters M.D on 10/30/2013 at 10:54 AM

## 2013-10-31 ENCOUNTER — Other Ambulatory Visit (HOSPITAL_COMMUNITY): Payer: Medicare Other

## 2013-10-31 LAB — CBC
HCT: 25 % — ABNORMAL LOW (ref 39.0–52.0)
Hemoglobin: 8.1 g/dL — ABNORMAL LOW (ref 13.0–17.0)
MCH: 24.8 pg — ABNORMAL LOW (ref 26.0–34.0)
MCHC: 32.4 g/dL (ref 30.0–36.0)
MCV: 76.7 fL — ABNORMAL LOW (ref 78.0–100.0)
PLATELETS: 419 10*3/uL — AB (ref 150–400)
RBC: 3.26 MIL/uL — ABNORMAL LOW (ref 4.22–5.81)
RDW: 18.6 % — AB (ref 11.5–15.5)
WBC: 11 10*3/uL — AB (ref 4.0–10.5)

## 2013-10-31 LAB — BASIC METABOLIC PANEL
BUN: 56 mg/dL — ABNORMAL HIGH (ref 6–23)
CO2: 28 mEq/L (ref 19–32)
Calcium: 8.6 mg/dL (ref 8.4–10.5)
Chloride: 100 mEq/L (ref 96–112)
Creatinine, Ser: 1.41 mg/dL — ABNORMAL HIGH (ref 0.50–1.35)
GFR calc non Af Amer: 54 mL/min — ABNORMAL LOW (ref >90)
GFR, EST AFRICAN AMERICAN: 63 mL/min — AB (ref >90)
Glucose, Bld: 87 mg/dL (ref 70–99)
POTASSIUM: 4.4 meq/L (ref 3.7–5.3)
Sodium: 140 mEq/L (ref 137–147)

## 2013-10-31 NOTE — Progress Notes (Signed)
PULMONARY  / CRITICAL CARE MEDICINE  Name: Robert [name] MRN: [id number] DOB: 1956/02/07  CONSULTATION DATE: 09/30/2013  BRIEF PATIENT DESCRIPTION: 58 y/o with h/o CVA, dysphagia, aspiration pneumonia and multiple prior intubations read[facility] with VDRF.  CULTURES: BAL 2/18>>>yeast/candida BCx2 2/24>>>neg UC 2/24>>>neg Tracheal Asp 2/23>>>neg Tracheal Asp 2/28>>> Acinetobacter (R to everything tested, was not tested for minocycline)  KEY EVENTS 2/18 - Trach (KZ) 2/20 - atc 3 hours 2/25 - ATC for 48 hours, fatigued and back on vent, ABG 30 min post vent good 2/27 - weaning on 40% ATC, tolerating well 3/02 - seizure and placed back on full support ventilation  3-9 on t-collar >48 hours 3/16 off vent x 1 week [facility]  EXAM General:  Appears chronically ill, off vent Neuro:  Follows commands cough / gag +,  HEENT:  PERRL, #8 trach midline c/d/i Cardiovascular:  RRR, no m/r/g Lungs:  Bilateral diminished bs bases Abdomen:  Soft, nontender, bowel sounds diminished Musculoskeletal:  No edema Skin:  No rash  LABS:  Recent Labs Lab 10/26/13 0635 10/29/13 0550 10/31/13 0500  NA 138 140 140  K 5.1 4.7 4.4  CL 99 102 100  CO2 24 29 28   BUN 55* 56* 56*  CREATININE 1.37* 1.34 1.41*  GLUCOSE 252* 169* 87    Recent Labs Lab 10/26/13 0635 10/29/13 0550 10/31/13 0500  HGB 8.4* 7.9* 8.1*  HCT 26.3* 25.2* 25.0*  WBC 10.1 9.2 11.0*  PLT 677* 532* 419*   Vital signs reviewed. Abnormal values will appear under impression plan section.   IMAGING: Dg Chest Port 1 View  10/31/2013   CLINICAL DATA:  Respiratory failure.  EXAM: PORTABLE CHEST - 1 VIEW  COMPARISON:  DG CHEST 1V PORT dated 10/29/2013; DG CHEST 1V PORT dated 09/22/2013; CT CHEST W/O CM dated 08/01/2013  FINDINGS: PICC line tip in the right subclavian vein region and unchanged. Again noted are diffuse interstitial lung densities, right side greater the left. Again noted is tracheal  deviation towards the left. 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 Slightly improved aeration at the left lung base. Tracheostomy tube is present.  IMPRESSION: Mildly improved aeration at the left lung base. Otherwise, there has been minimal change in the interstitial and airspace densities in both lungs.  Stable position of the right PICC line.   Electronically Signed   By: Richarda OverlieAdam  Henn M.D.   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  - His trach should never come out again > needs for airway protection -Trach care per protocol, sutures out -Scheduled BD's -Mobilize as able, restricted by CVA -PPI / Heparin  -Follow Cultures -ABX per primary; doubt Acinetobacter will be able to be eradicated -PCCM will sign off, please call back if needed.   Alyson ReedyWesam G. Kearstyn Avitia, M.D. [facility]. Pager: [phone]. After hours pager: [phone].

## 2013-10-31 NOTE — Progress Notes (Signed)
[facility]                                                                                              Progress note     Patient Demographics  Robert LightningDouglas [name], is a 58 y.o. male  AVW:[id number]  WGN:[id number]  DOB - 11-Sep-1955  Admit date - 09/29/2013  Admitting Physician Elnora MorrisonAhmad B Barakat, MD  Outpatient Primary MD for the patient is [name], ROBERTO, MD  LOS - 32   CC   respiratory failure          PCM         Generalized weakness   Subjective:   Robert [name] today cannot give any history due to neurostatus/encephalopathy . He had an episode of nausea and vomiting today. There was no residual in his tube feedings though  Objective:   Vital signs  Temperature 97.6 Heart rate 88 Respiratory rate 24 Blood pressure 147/45 Pulse ox 100%  Exam Awake Alert, confused,  No new F.N deficits,  [place].AT, mild facial deviation noted Supple Neck,No JVD, No cervical lymphadenopathy appriciated , tracheostomy at midline , increased secretions.  Symmetrical Chest wall movement, decreased breath sounds at the bases with mild scattered rhonchi RRR,No Gallops,Rubs or new Murmurs, No Parasternal Heave +ve B.Sounds, Abd Soft, Non tender, No organomegaly appriciated, No rebound - guarding or rigidity, GJ-tube in place. No Cyanosis, Clubbing or edema, No new Rash or bruise  Status post left BKA     I&Os 3690/1600 Peg Tube/Vital 1.5 Foley yes Trach #6 cuffless  Data Review   CBC  Recent Labs Lab 10/26/13 0635 10/29/13 0550 10/31/13 0500  WBC 10.1 9.2 11.0*  HGB 8.4* 7.9* 8.1*  HCT 26.3* 25.2* 25.0*  PLT 677* 532* 419*  MCV 77.6* 77.5* 76.7*  MCH 24.8* 24.3* 24.8*  MCHC 31.9 31.3 32.4  RDW 19.3* 18.7* 18.6*    Chemistries   Recent Labs Lab 10/26/13 0635 10/29/13 0550 10/31/13 0500  NA 138 140 140  K 5.1 4.7 4.4  CL 99 102 100  CO2 24 29 28   GLUCOSE 252* 169* 87  BUN  55* 56* 56*  CREATININE 1.37* 1.34 1.41*  CALCIUM 8.7 8.6 8.6    Chest x-ray 3/19    Improving left lower lobe infiltrate      Assessment & Plan    1. Respiratory failure, status post tracheostomy; no new problems     Continue with ATC 28%, PMV . ventilator removed from room     Trach downsized to a #6 cuffless ,   2. Aspiration pneumonia , Acinetobacter, M.D.R - off minocycline  3. Status post CVA with hemiplegia mental status improving 4. Hypernatremia , resolved 5. Acute kidney injury, creatinine slightly up today we'll give IV fluids 6. Diabetes mellitus   7. Peripheral vascular disease , status post left BKA, continue with aspirin and Lipitor 8. Protein calorie malnutrition ; continue with tube feeding, Vital1.5 9. 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  Code Status: Full     Consults  ID, PCCM  Antibiotics  None  DVT Prophylaxis  heparin   Carron Curie M.D on 10/31/2013 at 11:45 AM

## 2013-11-01 NOTE — Progress Notes (Signed)
[facility]                                                                                              Progress note     Patient Demographics  [name], is a 58 y.o. male  ZOX:[id number]  WJX:[id number]  DOB - [birth year]  Admit date - 09/29/2013  Admitting Physician Elnora MorrisonAhmad B Barakat, MD  Outpatient Primary MD for the patient is [name], ROBERTO, MD  LOS - 33   CC   respiratory failure          PCM         Generalized weakness   Subjective:   Unk Lightningouglas Galati today cannot give any history due to neurostatus/encephalopathy .   Objective:   Vital signs  Temperature 97.9 Heart rate 102 Respiratory rate 25  Blood pressure 130/78 Pulse ox 98%  Exam Awake Alert, confused,  No new F.N deficits,  [place].AT, mild facial deviation noted Supple Neck,No JVD, No cervical lymphadenopathy appriciated , tracheostomy at midline , increased secretions.  Symmetrical Chest wall movement, decreased breath sounds at the bases with mild scattered rhonchi RRR,No Gallops,Rubs or new Murmurs, No Parasternal Heave +ve B.Sounds, Abd Soft, Non tender, No organomegaly appriciated, No rebound - guarding or rigidity, GJ-tube in place. No Cyanosis, Clubbing or edema, No new Rash or bruise  Status post left BKA     I&Os 3220/1271 Peg Tube/Vital 1.5 Foley yes Trach #6 cuffless  Data Review   CBC  Recent Labs Lab 10/26/13 0635 10/29/13 0550 10/31/13 0500  WBC 10.1 9.2 11.0*  HGB 8.4* 7.9* 8.1*  HCT 26.3* 25.2* 25.0*  PLT 677* 532* 419*  MCV 77.6* 77.5* 76.7*  MCH 24.8* 24.3* 24.8*  MCHC 31.9 31.3 32.4  RDW 19.3* 18.7* 18.6*    Chemistries   Recent Labs Lab 10/26/13 0635 10/29/13 0550 10/31/13 0500  NA 138 140 140  K 5.1 4.7 4.4  CL 99 102 100  CO2 24 29 28   GLUCOSE 252* 169* 87  BUN 55* 56* 56*  CREATININE 1.37* 1.34 1.41*  CALCIUM 8.7 8.6 8.6    Chest x-ray 3/19  Improving left lower lobe infiltrate    EKG; normal  Assessment & Plan    1. Respiratory failure, status post tracheostomy; no new problems     Continue with ATC 28%, PMV .  2. Aspiration pneumonia , Acinetobacter, M.D.R - treated 3. Status post CVA with hemiplegia mental status improving 4. Hypernatremia , resolved 5. Acute kidney injury, creatinine slightly up today we'll give IV fluids 6. Diabetes mellitus   7. Peripheral vascular disease , status post left BKA, continue with aspirin and Lipitor 8. Protein calorie malnutrition ; continue with tube feeding, Vital1.5 9. 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

## 2013-11-02 NOTE — Progress Notes (Signed)
[facility]                                                                                              Progress note     Patient Demographics  Robert [name], is a 58 y.o. male  ZHY:[id number]  EXB:[id number]  DOB - 1956-07-20  Admit date - 09/29/2013  Admitting Physician Elnora MorrisonAhmad B Barakat, MD  Outpatient Primary MD for the patient is [name], ROBERTO, MD  LOS - 34   CC   respiratory failure          PCM         Generalized weakness   Subjective:   Robert [name] today cannot give any history due to neurostatus/encephalopathy .   Objective:   Vital signs  Temperature 97.0 Heart rate 82 Respiratory rate 21  Blood pressure 140/80 Pulse ox 100%  Exam Awake Alert, confused,  No new F.N deficits,  [place].AT, mild facial deviation noted Supple Neck,No JVD, No cervical lymphadenopathy appriciated , tracheostomy at midline , increased secretions.  Symmetrical Chest wall movement, decreased breath sounds at the bases with mild scattered rhonchi RRR,No Gallops,Rubs or new Murmurs, No Parasternal Heave +ve B.Sounds, Abd Soft, Non tender, No organomegaly appriciated, No rebound - guarding or rigidity, GJ-tube in place. No Cyanosis, Clubbing or edema, No new Rash or bruise  Status post left BKA     I&Os 2763/2200 Peg Tube/Vital 1.5 Foley yes Trach #6 cuffless  Data Review   CBC  Recent Labs Lab 10/29/13 0550 10/31/13 0500  WBC 9.2 11.0*  HGB 7.9* 8.1*  HCT 25.2* 25.0*  PLT 532* 419*  MCV 77.5* 76.7*  MCH 24.3* 24.8*  MCHC 31.3 32.4  RDW 18.7* 18.6*    Chemistries   Recent Labs Lab 10/29/13 0550 10/31/13 0500  NA 140 140  K 4.7 4.4  CL 102 100  CO2 29 28  GLUCOSE 169* 87  BUN 56* 56*  CREATININE 1.34 1.41*  CALCIUM 8.6 8.6    Chest x-ray 3/19    Improving left lower lobe infiltrate    EKG; normal  Assessment & Plan    1. Respiratory failure, status  post tracheostomy; no new problems     Continue with ATC 28%, PMV . No new problems 2. Aspiration pneumonia , Acinetobacter, M.D.R - treated 3. Status post CVA with hemiplegia mental status improving 4. Hypernatremia , resolved 5. Acute kidney injury, creatinine slightly up today we'll give IV fluids 6. Diabetes mellitus   7. Peripheral vascular disease , status post left BKA, continue with aspirin and Lipitor 8. Protein calorie malnutrition ; continue with tube feeding, Vital1.5 9. 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

## 2013-11-25 ENCOUNTER — Other Ambulatory Visit: Payer: Self-pay | Admitting: Family Medicine

## 2013-11-25 LAB — BASIC METABOLIC PANEL
Anion Gap: 4 — ABNORMAL LOW (ref 7–16)
BUN: 86 mg/dL — AB (ref 7–18)
Calcium, Total: 8.9 mg/dL (ref 8.5–10.1)
Chloride: 97 mmol/L — ABNORMAL LOW (ref 98–107)
Co2: 30 mmol/L (ref 21–32)
Creatinine: 2.19 mg/dL — ABNORMAL HIGH (ref 0.60–1.30)
EGFR (African American): 38 — ABNORMAL LOW
GFR CALC NON AF AMER: 32 — AB
GLUCOSE: 185 mg/dL — AB (ref 65–99)
Osmolality: 294 (ref 275–301)
Potassium: 5.3 mmol/L — ABNORMAL HIGH (ref 3.5–5.1)
Sodium: 131 mmol/L — ABNORMAL LOW (ref 136–145)

## 2013-11-26 LAB — CBC WITH DIFFERENTIAL/PLATELET
Basophil #: 0.1 10*3/uL (ref 0.0–0.1)
Basophil %: 0.5 %
Eosinophil #: 0.1 10*3/uL (ref 0.0–0.7)
Eosinophil %: 0.6 %
HCT: 32.5 % — ABNORMAL LOW (ref 40.0–52.0)
HGB: 10.2 g/dL — ABNORMAL LOW (ref 13.0–18.0)
Lymphocyte #: 2.7 10*3/uL (ref 1.0–3.6)
Lymphocyte %: 13.9 %
MCH: 24 pg — AB (ref 26.0–34.0)
MCHC: 31.4 g/dL — AB (ref 32.0–36.0)
MCV: 77 fL — ABNORMAL LOW (ref 80–100)
MONOS PCT: 5.4 %
Monocyte #: 1 x10 3/mm (ref 0.2–1.0)
NEUTROS ABS: 15.2 10*3/uL — AB (ref 1.4–6.5)
NEUTROS PCT: 79.6 %
Platelet: 344 10*3/uL (ref 150–440)
RBC: 4.25 10*6/uL — ABNORMAL LOW (ref 4.40–5.90)
RDW: 16.7 % — ABNORMAL HIGH (ref 11.5–14.5)
WBC: 19 10*3/uL — ABNORMAL HIGH (ref 3.8–10.6)

## 2013-11-29 ENCOUNTER — Other Ambulatory Visit: Payer: Self-pay | Admitting: Family Medicine

## 2013-11-29 LAB — BASIC METABOLIC PANEL
ANION GAP: 8 (ref 7–16)
BUN: 68 mg/dL — ABNORMAL HIGH (ref 7–18)
Calcium, Total: 8.9 mg/dL (ref 8.5–10.1)
Chloride: 96 mmol/L — ABNORMAL LOW (ref 98–107)
Co2: 28 mmol/L (ref 21–32)
Creatinine: 2.12 mg/dL — ABNORMAL HIGH (ref 0.60–1.30)
EGFR (African American): 39 — ABNORMAL LOW
EGFR (Non-African Amer.): 34 — ABNORMAL LOW
Glucose: 118 mg/dL — ABNORMAL HIGH (ref 65–99)
Osmolality: 285 (ref 275–301)
Potassium: 4.9 mmol/L (ref 3.5–5.1)
Sodium: 132 mmol/L — ABNORMAL LOW (ref 136–145)

## 2013-12-09 ENCOUNTER — Other Ambulatory Visit: Payer: Self-pay | Admitting: Family Medicine

## 2013-12-09 LAB — BASIC METABOLIC PANEL
ANION GAP: 7 (ref 7–16)
BUN: 58 mg/dL — ABNORMAL HIGH (ref 7–18)
CHLORIDE: 127 mmol/L — AB (ref 98–107)
Calcium, Total: 8.6 mg/dL (ref 8.5–10.1)
Co2: 25 mmol/L (ref 21–32)
Creatinine: 2.11 mg/dL — ABNORMAL HIGH (ref 0.60–1.30)
EGFR (African American): 39 — ABNORMAL LOW
EGFR (Non-African Amer.): 34 — ABNORMAL LOW
Glucose: 127 mg/dL — ABNORMAL HIGH (ref 65–99)
Osmolality: 333 (ref 275–301)
Potassium: 5.6 mmol/L — ABNORMAL HIGH (ref 3.5–5.1)
Sodium: 159 mmol/L — ABNORMAL HIGH (ref 136–145)

## 2013-12-11 ENCOUNTER — Other Ambulatory Visit: Payer: Self-pay | Admitting: Family Medicine

## 2013-12-11 LAB — BASIC METABOLIC PANEL
Anion Gap: 6 — ABNORMAL LOW (ref 7–16)
BUN: 55 mg/dL — ABNORMAL HIGH (ref 7–18)
CALCIUM: 9 mg/dL (ref 8.5–10.1)
CREATININE: 1.96 mg/dL — AB (ref 0.60–1.30)
Chloride: 119 mmol/L — ABNORMAL HIGH (ref 98–107)
Co2: 26 mmol/L (ref 21–32)
EGFR (Non-African Amer.): 37 — ABNORMAL LOW
GFR CALC AF AMER: 43 — AB
GLUCOSE: 62 mg/dL — AB (ref 65–99)
Osmolality: 313 (ref 275–301)
Potassium: 5.3 mmol/L — ABNORMAL HIGH (ref 3.5–5.1)
SODIUM: 151 mmol/L — AB (ref 136–145)

## 2013-12-14 ENCOUNTER — Other Ambulatory Visit: Payer: Self-pay | Admitting: Family Medicine

## 2013-12-14 LAB — BASIC METABOLIC PANEL
Anion Gap: 9 (ref 7–16)
BUN: 52 mg/dL — ABNORMAL HIGH (ref 7–18)
CALCIUM: 9 mg/dL (ref 8.5–10.1)
CO2: 22 mmol/L (ref 21–32)
Chloride: 102 mmol/L (ref 98–107)
Creatinine: 1.86 mg/dL — ABNORMAL HIGH (ref 0.60–1.30)
EGFR (Non-African Amer.): 40 — ABNORMAL LOW
GFR CALC AF AMER: 46 — AB
GLUCOSE: 292 mg/dL — AB (ref 65–99)
Osmolality: 291 (ref 275–301)
Potassium: 5.9 mmol/L — ABNORMAL HIGH (ref 3.5–5.1)
Sodium: 133 mmol/L — ABNORMAL LOW (ref 136–145)

## 2014-01-19 ENCOUNTER — Ambulatory Visit: Payer: Self-pay | Admitting: Internal Medicine

## 2014-01-26 ENCOUNTER — Inpatient Hospital Stay: Payer: Self-pay | Admitting: Internal Medicine

## 2014-01-26 LAB — COMPREHENSIVE METABOLIC PANEL
Albumin: 2.3 g/dL — ABNORMAL LOW (ref 3.4–5.0)
Alkaline Phosphatase: 141 U/L — ABNORMAL HIGH
Anion Gap: 8 (ref 7–16)
BUN: 69 mg/dL — AB (ref 7–18)
Bilirubin,Total: 0.5 mg/dL (ref 0.2–1.0)
CALCIUM: 9.2 mg/dL (ref 8.5–10.1)
CHLORIDE: 101 mmol/L (ref 98–107)
Co2: 25 mmol/L (ref 21–32)
Creatinine: 3.54 mg/dL — ABNORMAL HIGH (ref 0.60–1.30)
GFR CALC AF AMER: 21 — AB
GFR CALC NON AF AMER: 18 — AB
Glucose: 99 mg/dL (ref 65–99)
Osmolality: 288 (ref 275–301)
POTASSIUM: 6.8 mmol/L — AB (ref 3.5–5.1)
SGOT(AST): 57 U/L — ABNORMAL HIGH (ref 15–37)
SGPT (ALT): 53 U/L (ref 12–78)
SODIUM: 134 mmol/L — AB (ref 136–145)
Total Protein: 8.1 g/dL (ref 6.4–8.2)

## 2014-01-26 LAB — CBC
HCT: 42 % (ref 40.0–52.0)
HGB: 12.7 g/dL — AB (ref 13.0–18.0)
MCH: 23.6 pg — ABNORMAL LOW (ref 26.0–34.0)
MCHC: 30.2 g/dL — AB (ref 32.0–36.0)
MCV: 78 fL — AB (ref 80–100)
PLATELETS: 248 10*3/uL (ref 150–440)
RBC: 5.39 10*6/uL (ref 4.40–5.90)
RDW: 16 % — ABNORMAL HIGH (ref 11.5–14.5)
WBC: 9.2 10*3/uL (ref 3.8–10.6)

## 2014-01-26 LAB — URINALYSIS, COMPLETE
BLOOD: NEGATIVE
Bilirubin,UR: NEGATIVE
Glucose,UR: 50 mg/dL (ref 0–75)
KETONE: NEGATIVE
Leukocyte Esterase: NEGATIVE
Nitrite: NEGATIVE
PH: 5 (ref 4.5–8.0)
Protein: >=500
RBC, UR: NONE SEEN /HPF (ref 0–5)
Specific Gravity: 1.016 (ref 1.003–1.030)
WBC UR: 3 /HPF (ref 0–5)

## 2014-01-26 LAB — TROPONIN I: Troponin-I: 0.03 ng/mL

## 2014-01-26 LAB — POTASSIUM: Potassium: 5.9 mmol/L — ABNORMAL HIGH (ref 3.5–5.1)

## 2014-01-27 LAB — CBC WITH DIFFERENTIAL/PLATELET
Basophil #: 0 10*3/uL (ref 0.0–0.1)
Basophil %: 0.4 %
EOS PCT: 0.1 %
Eosinophil #: 0 10*3/uL (ref 0.0–0.7)
HCT: 30.4 % — ABNORMAL LOW (ref 40.0–52.0)
HGB: 9.4 g/dL — AB (ref 13.0–18.0)
LYMPHS PCT: 12.5 %
Lymphocyte #: 1 10*3/uL (ref 1.0–3.6)
MCH: 24.1 pg — ABNORMAL LOW (ref 26.0–34.0)
MCHC: 30.9 g/dL — AB (ref 32.0–36.0)
MCV: 78 fL — ABNORMAL LOW (ref 80–100)
MONOS PCT: 6.4 %
Monocyte #: 0.5 x10 3/mm (ref 0.2–1.0)
NEUTROS ABS: 6.6 10*3/uL — AB (ref 1.4–6.5)
NEUTROS PCT: 80.6 %
PLATELETS: 190 10*3/uL (ref 150–440)
RBC: 3.89 10*6/uL — AB (ref 4.40–5.90)
RDW: 16.2 % — ABNORMAL HIGH (ref 11.5–14.5)
WBC: 8.2 10*3/uL (ref 3.8–10.6)

## 2014-01-27 LAB — BASIC METABOLIC PANEL
Anion Gap: 7 (ref 7–16)
BUN: 71 mg/dL — ABNORMAL HIGH (ref 7–18)
CALCIUM: 8.1 mg/dL — AB (ref 8.5–10.1)
Chloride: 107 mmol/L (ref 98–107)
Co2: 25 mmol/L (ref 21–32)
Creatinine: 3.53 mg/dL — ABNORMAL HIGH (ref 0.60–1.30)
EGFR (African American): 21 — ABNORMAL LOW
GFR CALC NON AF AMER: 18 — AB
Glucose: 245 mg/dL — ABNORMAL HIGH (ref 65–99)
OSMOLALITY: 307 (ref 275–301)
Potassium: 5.1 mmol/L (ref 3.5–5.1)
SODIUM: 139 mmol/L (ref 136–145)

## 2014-01-27 LAB — PROTEIN / CREATININE RATIO, URINE
Creatinine, Urine: 63.3 mg/dL (ref 30.0–125.0)
PROTEIN, RANDOM URINE: 231 mg/dL — AB (ref 0–12)
Protein/Creat. Ratio: 3649 mg/gCREAT — ABNORMAL HIGH (ref 0–200)

## 2014-01-28 LAB — CBC WITH DIFFERENTIAL/PLATELET
BASOS PCT: 0.6 %
Basophil #: 0.1 10*3/uL (ref 0.0–0.1)
EOS PCT: 2.8 %
Eosinophil #: 0.3 10*3/uL (ref 0.0–0.7)
HCT: 27.9 % — ABNORMAL LOW (ref 40.0–52.0)
HGB: 8.9 g/dL — AB (ref 13.0–18.0)
Lymphocyte #: 1.3 10*3/uL (ref 1.0–3.6)
Lymphocyte %: 12.7 %
MCH: 24.5 pg — AB (ref 26.0–34.0)
MCHC: 32.1 g/dL (ref 32.0–36.0)
MCV: 76 fL — AB (ref 80–100)
Monocyte #: 0.5 x10 3/mm (ref 0.2–1.0)
Monocyte %: 4.7 %
Neutrophil #: 8.3 10*3/uL — ABNORMAL HIGH (ref 1.4–6.5)
Neutrophil %: 79.2 %
Platelet: 172 10*3/uL (ref 150–440)
RBC: 3.65 10*6/uL — ABNORMAL LOW (ref 4.40–5.90)
RDW: 15.6 % — AB (ref 11.5–14.5)
WBC: 10.4 10*3/uL (ref 3.8–10.6)

## 2014-01-28 LAB — BASIC METABOLIC PANEL
Anion Gap: 8 (ref 7–16)
BUN: 81 mg/dL — ABNORMAL HIGH (ref 7–18)
CO2: 26 mmol/L (ref 21–32)
CREATININE: 3.22 mg/dL — AB (ref 0.60–1.30)
Calcium, Total: 7.8 mg/dL — ABNORMAL LOW (ref 8.5–10.1)
Chloride: 105 mmol/L (ref 98–107)
EGFR (African American): 24 — ABNORMAL LOW
GFR CALC NON AF AMER: 20 — AB
GLUCOSE: 175 mg/dL — AB (ref 65–99)
Osmolality: 306 (ref 275–301)
Potassium: 4.9 mmol/L (ref 3.5–5.1)
Sodium: 139 mmol/L (ref 136–145)

## 2014-01-28 LAB — PHOSPHORUS: PHOSPHORUS: 4 mg/dL (ref 2.5–4.9)

## 2014-01-28 LAB — MAGNESIUM: Magnesium: 2.3 mg/dL

## 2014-01-28 LAB — TRIGLYCERIDES: Triglycerides: 102 mg/dL (ref 0–200)

## 2014-01-29 LAB — CBC WITH DIFFERENTIAL/PLATELET
BASOS PCT: 0.9 %
Basophil #: 0.1 10*3/uL (ref 0.0–0.1)
EOS PCT: 3.8 %
Eosinophil #: 0.4 10*3/uL (ref 0.0–0.7)
HCT: 25.5 % — AB (ref 40.0–52.0)
HGB: 8.1 g/dL — AB (ref 13.0–18.0)
LYMPHS PCT: 16.5 %
Lymphocyte #: 1.7 10*3/uL (ref 1.0–3.6)
MCH: 24.3 pg — AB (ref 26.0–34.0)
MCHC: 31.8 g/dL — AB (ref 32.0–36.0)
MCV: 76 fL — ABNORMAL LOW (ref 80–100)
MONO ABS: 0.9 x10 3/mm (ref 0.2–1.0)
Monocyte %: 8.8 %
NEUTROS PCT: 70 %
Neutrophil #: 7.1 10*3/uL — ABNORMAL HIGH (ref 1.4–6.5)
Platelet: 165 10*3/uL (ref 150–440)
RBC: 3.35 10*6/uL — AB (ref 4.40–5.90)
RDW: 15.5 % — AB (ref 11.5–14.5)
WBC: 10.1 10*3/uL (ref 3.8–10.6)

## 2014-01-29 LAB — MAGNESIUM: MAGNESIUM: 2.5 mg/dL — AB

## 2014-01-29 LAB — BASIC METABOLIC PANEL
Anion Gap: 9 (ref 7–16)
BUN: 87 mg/dL — AB (ref 7–18)
Calcium, Total: 8 mg/dL — ABNORMAL LOW (ref 8.5–10.1)
Chloride: 103 mmol/L (ref 98–107)
Co2: 25 mmol/L (ref 21–32)
Creatinine: 3.51 mg/dL — ABNORMAL HIGH (ref 0.60–1.30)
EGFR (African American): 21 — ABNORMAL LOW
EGFR (Non-African Amer.): 18 — ABNORMAL LOW
GLUCOSE: 125 mg/dL — AB (ref 65–99)
Osmolality: 302 (ref 275–301)
Potassium: 4.5 mmol/L (ref 3.5–5.1)
Sodium: 137 mmol/L (ref 136–145)

## 2014-01-29 LAB — PHOSPHORUS: Phosphorus: 4.1 mg/dL (ref 2.5–4.9)

## 2014-01-30 LAB — CBC WITH DIFFERENTIAL/PLATELET
BASOS ABS: 0 10*3/uL (ref 0.0–0.1)
Basophil %: 0.7 %
EOS ABS: 0.3 10*3/uL (ref 0.0–0.7)
Eosinophil %: 5.1 %
HCT: 26.4 % — ABNORMAL LOW (ref 40.0–52.0)
HGB: 8.4 g/dL — ABNORMAL LOW (ref 13.0–18.0)
Lymphocyte #: 1.1 10*3/uL (ref 1.0–3.6)
Lymphocyte %: 16.7 %
MCH: 24.2 pg — ABNORMAL LOW (ref 26.0–34.0)
MCHC: 31.9 g/dL — ABNORMAL LOW (ref 32.0–36.0)
MCV: 76 fL — AB (ref 80–100)
MONO ABS: 0.9 x10 3/mm (ref 0.2–1.0)
Monocyte %: 13.8 %
NEUTROS PCT: 63.7 %
Neutrophil #: 4 10*3/uL (ref 1.4–6.5)
PLATELETS: 151 10*3/uL (ref 150–440)
RBC: 3.49 10*6/uL — ABNORMAL LOW (ref 4.40–5.90)
RDW: 15.6 % — AB (ref 11.5–14.5)
WBC: 6.3 10*3/uL (ref 3.8–10.6)

## 2014-01-30 LAB — BASIC METABOLIC PANEL
Anion Gap: 7 (ref 7–16)
BUN: 94 mg/dL — AB (ref 7–18)
CO2: 24 mmol/L (ref 21–32)
CREATININE: 3.36 mg/dL — AB (ref 0.60–1.30)
Calcium, Total: 7.9 mg/dL — ABNORMAL LOW (ref 8.5–10.1)
Chloride: 103 mmol/L (ref 98–107)
GFR CALC AF AMER: 22 — AB
GFR CALC NON AF AMER: 19 — AB
Glucose: 178 mg/dL — ABNORMAL HIGH (ref 65–99)
OSMOLALITY: 302 (ref 275–301)
Potassium: 4.5 mmol/L (ref 3.5–5.1)
SODIUM: 134 mmol/L — AB (ref 136–145)

## 2014-01-31 LAB — BASIC METABOLIC PANEL
ANION GAP: 10 (ref 7–16)
BUN: 92 mg/dL — AB (ref 7–18)
CALCIUM: 8 mg/dL — AB (ref 8.5–10.1)
Chloride: 102 mmol/L (ref 98–107)
Co2: 23 mmol/L (ref 21–32)
Creatinine: 3.14 mg/dL — ABNORMAL HIGH (ref 0.60–1.30)
EGFR (African American): 24 — ABNORMAL LOW
EGFR (Non-African Amer.): 21 — ABNORMAL LOW
Glucose: 206 mg/dL — ABNORMAL HIGH (ref 65–99)
OSMOLALITY: 304 (ref 275–301)
Potassium: 4.5 mmol/L (ref 3.5–5.1)
SODIUM: 135 mmol/L — AB (ref 136–145)

## 2014-01-31 LAB — MAGNESIUM: Magnesium: 2.6 mg/dL — ABNORMAL HIGH

## 2014-01-31 LAB — CREATININE CLEARANCE, URINE, 24 HOUR
Collection Hours: 24 hours
Creatinine Clearance: 9 mL/min — ABNORMAL LOW (ref 80–130)
Creatinine, Serum: 3.14 mg/dL — ABNORMAL HIGH (ref 0.50–1.20)
Creatinine, Urine: 50.6 mg/dL (ref 30.0–125.0)
Total Volume: 850 mL

## 2014-01-31 LAB — PHOSPHORUS: PHOSPHORUS: 3.9 mg/dL (ref 2.5–4.9)

## 2014-01-31 LAB — URINE CULTURE

## 2014-02-01 LAB — CBC WITH DIFFERENTIAL/PLATELET
Basophil #: 0.1 10*3/uL (ref 0.0–0.1)
Basophil %: 0.6 %
EOS PCT: 4 %
Eosinophil #: 0.5 10*3/uL (ref 0.0–0.7)
HCT: 27.9 % — ABNORMAL LOW (ref 40.0–52.0)
HGB: 9 g/dL — AB (ref 13.0–18.0)
LYMPHS PCT: 20 %
Lymphocyte #: 2.3 10*3/uL (ref 1.0–3.6)
MCH: 24.1 pg — ABNORMAL LOW (ref 26.0–34.0)
MCHC: 32.1 g/dL (ref 32.0–36.0)
MCV: 75 fL — AB (ref 80–100)
MONO ABS: 1.6 x10 3/mm — AB (ref 0.2–1.0)
MONOS PCT: 14.4 %
Neutrophil #: 7 10*3/uL — ABNORMAL HIGH (ref 1.4–6.5)
Neutrophil %: 61 %
Platelet: 196 10*3/uL (ref 150–440)
RBC: 3.72 10*6/uL — AB (ref 4.40–5.90)
RDW: 15.4 % — AB (ref 11.5–14.5)
WBC: 11.4 10*3/uL — ABNORMAL HIGH (ref 3.8–10.6)

## 2014-02-01 LAB — BASIC METABOLIC PANEL
Anion Gap: 10 (ref 7–16)
BUN: 109 mg/dL — AB (ref 7–18)
CALCIUM: 7.9 mg/dL — AB (ref 8.5–10.1)
CO2: 23 mmol/L (ref 21–32)
CREATININE: 3.27 mg/dL — AB (ref 0.60–1.30)
Chloride: 101 mmol/L (ref 98–107)
EGFR (African American): 23 — ABNORMAL LOW
GFR CALC NON AF AMER: 20 — AB
Glucose: 149 mg/dL — ABNORMAL HIGH (ref 65–99)
Osmolality: 305 (ref 275–301)
Potassium: 5 mmol/L (ref 3.5–5.1)
SODIUM: 134 mmol/L — AB (ref 136–145)

## 2014-02-01 LAB — CULTURE, BLOOD (SINGLE)

## 2014-02-01 LAB — CLOSTRIDIUM DIFFICILE(ARMC)

## 2014-02-02 LAB — BASIC METABOLIC PANEL
Anion Gap: 11 (ref 7–16)
BUN: 130 mg/dL — ABNORMAL HIGH (ref 7–18)
Calcium, Total: 8.2 mg/dL — ABNORMAL LOW (ref 8.5–10.1)
Chloride: 101 mmol/L (ref 98–107)
Co2: 23 mmol/L (ref 21–32)
Creatinine: 3.31 mg/dL — ABNORMAL HIGH (ref 0.60–1.30)
EGFR (African American): 23 — ABNORMAL LOW
EGFR (Non-African Amer.): 20 — ABNORMAL LOW
Glucose: 150 mg/dL — ABNORMAL HIGH (ref 65–99)
OSMOLALITY: 315 (ref 275–301)
POTASSIUM: 5.6 mmol/L — AB (ref 3.5–5.1)
Sodium: 135 mmol/L — ABNORMAL LOW (ref 136–145)

## 2014-02-02 LAB — CBC WITH DIFFERENTIAL/PLATELET
BASOS ABS: 0.1 10*3/uL (ref 0.0–0.1)
Basophil %: 1.1 %
EOS ABS: 0.4 10*3/uL (ref 0.0–0.7)
EOS PCT: 3.4 %
HCT: 28.3 % — ABNORMAL LOW (ref 40.0–52.0)
HGB: 8.8 g/dL — ABNORMAL LOW (ref 13.0–18.0)
LYMPHS ABS: 2 10*3/uL (ref 1.0–3.6)
Lymphocyte %: 18 %
MCH: 23.3 pg — ABNORMAL LOW (ref 26.0–34.0)
MCHC: 31.2 g/dL — ABNORMAL LOW (ref 32.0–36.0)
MCV: 75 fL — ABNORMAL LOW (ref 80–100)
MONOS PCT: 11 %
Monocyte #: 1.2 x10 3/mm — ABNORMAL HIGH (ref 0.2–1.0)
Neutrophil #: 7.3 10*3/uL — ABNORMAL HIGH (ref 1.4–6.5)
Neutrophil %: 66.5 %
Platelet: 205 10*3/uL (ref 150–440)
RBC: 3.78 10*6/uL — ABNORMAL LOW (ref 4.40–5.90)
RDW: 15.6 % — AB (ref 11.5–14.5)
WBC: 11 10*3/uL — ABNORMAL HIGH (ref 3.8–10.6)

## 2014-02-02 LAB — MAGNESIUM: Magnesium: 2.6 mg/dL — ABNORMAL HIGH

## 2014-02-02 LAB — PHOSPHORUS: Phosphorus: 6.7 mg/dL — ABNORMAL HIGH (ref 2.5–4.9)

## 2014-02-03 LAB — TRIGLYCERIDES: Triglycerides: 109 mg/dL (ref 0–200)

## 2014-02-03 LAB — BASIC METABOLIC PANEL
Anion Gap: 12 (ref 7–16)
BUN: 147 mg/dL — AB (ref 7–18)
CALCIUM: 8.2 mg/dL — AB (ref 8.5–10.1)
CO2: 22 mmol/L (ref 21–32)
Chloride: 103 mmol/L (ref 98–107)
Creatinine: 3.1 mg/dL — ABNORMAL HIGH (ref 0.60–1.30)
EGFR (African American): 25 — ABNORMAL LOW
EGFR (Non-African Amer.): 21 — ABNORMAL LOW
GLUCOSE: 191 mg/dL — AB (ref 65–99)
Osmolality: 327 (ref 275–301)
POTASSIUM: 5.2 mmol/L — AB (ref 3.5–5.1)
Sodium: 137 mmol/L (ref 136–145)

## 2014-02-03 LAB — RENAL FUNCTION PANEL
Albumin: 1.2 g/dL — ABNORMAL LOW (ref 3.4–5.0)
Anion Gap: 11 (ref 7–16)
BUN: 117 mg/dL — AB (ref 7–18)
CREATININE: 2.64 mg/dL — AB (ref 0.60–1.30)
Calcium, Total: 8.2 mg/dL — ABNORMAL LOW (ref 8.5–10.1)
Chloride: 102 mmol/L (ref 98–107)
Co2: 24 mmol/L (ref 21–32)
EGFR (African American): 30 — ABNORMAL LOW
EGFR (Non-African Amer.): 26 — ABNORMAL LOW
Glucose: 183 mg/dL — ABNORMAL HIGH (ref 65–99)
Osmolality: 316 (ref 275–301)
PHOSPHORUS: 6.4 mg/dL — AB (ref 2.5–4.9)
Potassium: 4.8 mmol/L (ref 3.5–5.1)
SODIUM: 137 mmol/L (ref 136–145)

## 2014-02-03 LAB — PHOSPHORUS: Phosphorus: 7.3 mg/dL — ABNORMAL HIGH (ref 2.5–4.9)

## 2014-02-03 LAB — MAGNESIUM: MAGNESIUM: 2.6 mg/dL — AB

## 2014-02-04 LAB — BASIC METABOLIC PANEL
Anion Gap: 13 (ref 7–16)
BUN: 94 mg/dL — AB (ref 7–18)
CREATININE: 2.38 mg/dL — AB (ref 0.60–1.30)
Calcium, Total: 8.1 mg/dL — ABNORMAL LOW (ref 8.5–10.1)
Chloride: 102 mmol/L (ref 98–107)
Co2: 26 mmol/L (ref 21–32)
EGFR (African American): 34 — ABNORMAL LOW
GFR CALC NON AF AMER: 29 — AB
Glucose: 242 mg/dL — ABNORMAL HIGH (ref 65–99)
OSMOLALITY: 318 (ref 275–301)
Potassium: 4.5 mmol/L (ref 3.5–5.1)
Sodium: 141 mmol/L (ref 136–145)

## 2014-02-04 LAB — MAGNESIUM: MAGNESIUM: 2.5 mg/dL — AB

## 2014-02-04 LAB — PHOSPHORUS: PHOSPHORUS: 6.6 mg/dL — AB (ref 2.5–4.9)

## 2014-02-05 LAB — BASIC METABOLIC PANEL
Anion Gap: 7 (ref 7–16)
BUN: 77 mg/dL — ABNORMAL HIGH (ref 7–18)
Calcium, Total: 8.1 mg/dL — ABNORMAL LOW (ref 8.5–10.1)
Chloride: 103 mmol/L (ref 98–107)
Co2: 29 mmol/L (ref 21–32)
Creatinine: 1.95 mg/dL — ABNORMAL HIGH (ref 0.60–1.30)
EGFR (Non-African Amer.): 37 — ABNORMAL LOW
GFR CALC AF AMER: 43 — AB
Glucose: 190 mg/dL — ABNORMAL HIGH (ref 65–99)
OSMOLALITY: 306 (ref 275–301)
POTASSIUM: 4.3 mmol/L (ref 3.5–5.1)
Sodium: 139 mmol/L (ref 136–145)

## 2014-02-05 LAB — PHOSPHORUS
Phosphorus: 5.7 mg/dL — ABNORMAL HIGH (ref 2.5–4.9)
Phosphorus: 6.6 mg/dL — ABNORMAL HIGH (ref 2.5–4.9)

## 2014-02-05 LAB — MAGNESIUM: MAGNESIUM: 2.2 mg/dL

## 2014-02-06 LAB — BASIC METABOLIC PANEL
Anion Gap: 7 (ref 7–16)
BUN: 55 mg/dL — AB (ref 7–18)
Calcium, Total: 8.1 mg/dL — ABNORMAL LOW (ref 8.5–10.1)
Chloride: 103 mmol/L (ref 98–107)
Co2: 32 mmol/L (ref 21–32)
Creatinine: 1.71 mg/dL — ABNORMAL HIGH (ref 0.60–1.30)
EGFR (African American): 51 — ABNORMAL LOW
GFR CALC NON AF AMER: 44 — AB
GLUCOSE: 72 mg/dL (ref 65–99)
OSMOLALITY: 297 (ref 275–301)
Potassium: 4 mmol/L (ref 3.5–5.1)
SODIUM: 142 mmol/L (ref 136–145)

## 2014-02-06 LAB — CBC WITH DIFFERENTIAL/PLATELET
BASOS ABS: 0.2 10*3/uL — AB (ref 0.0–0.1)
BASOS PCT: 1.4 %
Eosinophil #: 0.4 10*3/uL (ref 0.0–0.7)
Eosinophil %: 2.9 %
HCT: 30.3 % — AB (ref 40.0–52.0)
HGB: 9.7 g/dL — ABNORMAL LOW (ref 13.0–18.0)
Lymphocyte #: 3.7 10*3/uL — ABNORMAL HIGH (ref 1.0–3.6)
Lymphocyte %: 24.2 %
MCH: 23.8 pg — ABNORMAL LOW (ref 26.0–34.0)
MCHC: 32.1 g/dL (ref 32.0–36.0)
MCV: 74 fL — AB (ref 80–100)
MONOS PCT: 7.4 %
Monocyte #: 1.1 x10 3/mm — ABNORMAL HIGH (ref 0.2–1.0)
NEUTROS ABS: 9.9 10*3/uL — AB (ref 1.4–6.5)
Neutrophil %: 64.1 %
Platelet: 79 10*3/uL — ABNORMAL LOW (ref 150–440)
RBC: 4.08 10*6/uL — ABNORMAL LOW (ref 4.40–5.90)
RDW: 15 % — ABNORMAL HIGH (ref 11.5–14.5)
WBC: 15.4 10*3/uL — ABNORMAL HIGH (ref 3.8–10.6)

## 2014-02-07 LAB — BASIC METABOLIC PANEL
Anion Gap: 7 (ref 7–16)
BUN: 68 mg/dL — ABNORMAL HIGH (ref 7–18)
CHLORIDE: 102 mmol/L (ref 98–107)
Calcium, Total: 7.7 mg/dL — ABNORMAL LOW (ref 8.5–10.1)
Co2: 31 mmol/L (ref 21–32)
Creatinine: 2.2 mg/dL — ABNORMAL HIGH (ref 0.60–1.30)
GFR CALC AF AMER: 37 — AB
GFR CALC NON AF AMER: 32 — AB
GLUCOSE: 149 mg/dL — AB (ref 65–99)
Osmolality: 302 (ref 275–301)
POTASSIUM: 4.1 mmol/L (ref 3.5–5.1)
Sodium: 140 mmol/L (ref 136–145)

## 2014-02-07 LAB — PHOSPHORUS: PHOSPHORUS: 5.2 mg/dL — AB (ref 2.5–4.9)

## 2014-02-08 LAB — CBC WITH DIFFERENTIAL/PLATELET
Basophil #: 0.3 10*3/uL — ABNORMAL HIGH (ref 0.0–0.1)
Basophil %: 2.2 %
EOS ABS: 0.4 10*3/uL (ref 0.0–0.7)
Eosinophil %: 2.7 %
HCT: 29.3 % — ABNORMAL LOW (ref 40.0–52.0)
HGB: 9.3 g/dL — ABNORMAL LOW (ref 13.0–18.0)
Lymphocyte #: 3.7 10*3/uL — ABNORMAL HIGH (ref 1.0–3.6)
Lymphocyte %: 26 %
MCH: 23.6 pg — AB (ref 26.0–34.0)
MCHC: 31.7 g/dL — ABNORMAL LOW (ref 32.0–36.0)
MCV: 74 fL — ABNORMAL LOW (ref 80–100)
Monocyte #: 0.9 x10 3/mm (ref 0.2–1.0)
Monocyte %: 6.6 %
NEUTROS ABS: 8.8 10*3/uL — AB (ref 1.4–6.5)
NEUTROS PCT: 62.5 %
Platelet: 118 10*3/uL — ABNORMAL LOW (ref 150–440)
RBC: 3.95 10*6/uL — AB (ref 4.40–5.90)
RDW: 14.5 % (ref 11.5–14.5)
WBC: 14.1 10*3/uL — ABNORMAL HIGH (ref 3.8–10.6)

## 2014-02-09 LAB — EXPECTORATED SPUTUM ASSESSMENT W REFEX TO RESP CULTURE

## 2014-02-10 LAB — CBC WITH DIFFERENTIAL/PLATELET
BASOS PCT: 2.3 %
Basophil #: 0.3 10*3/uL — ABNORMAL HIGH (ref 0.0–0.1)
Eosinophil #: 0.3 10*3/uL (ref 0.0–0.7)
Eosinophil %: 2 %
HCT: 32 % — AB (ref 40.0–52.0)
HGB: 10 g/dL — AB (ref 13.0–18.0)
LYMPHS PCT: 21.9 %
Lymphocyte #: 3 10*3/uL (ref 1.0–3.6)
MCH: 23.1 pg — ABNORMAL LOW (ref 26.0–34.0)
MCHC: 31.2 g/dL — ABNORMAL LOW (ref 32.0–36.0)
MCV: 74 fL — AB (ref 80–100)
Monocyte #: 0.8 x10 3/mm (ref 0.2–1.0)
Monocyte %: 5.9 %
Neutrophil #: 9.3 10*3/uL — ABNORMAL HIGH (ref 1.4–6.5)
Neutrophil %: 67.9 %
Platelet: 276 10*3/uL (ref 150–440)
RBC: 4.33 10*6/uL — AB (ref 4.40–5.90)
RDW: 14.9 % — ABNORMAL HIGH (ref 11.5–14.5)
WBC: 13.6 10*3/uL — AB (ref 3.8–10.6)

## 2014-02-10 LAB — BASIC METABOLIC PANEL
ANION GAP: 10 (ref 7–16)
BUN: 71 mg/dL — ABNORMAL HIGH (ref 7–18)
CO2: 28 mmol/L (ref 21–32)
Calcium, Total: 8 mg/dL — ABNORMAL LOW (ref 8.5–10.1)
Chloride: 103 mmol/L (ref 98–107)
Creatinine: 2.41 mg/dL — ABNORMAL HIGH (ref 0.60–1.30)
EGFR (Non-African Amer.): 29 — ABNORMAL LOW
GFR CALC AF AMER: 34 — AB
GLUCOSE: 181 mg/dL — AB (ref 65–99)
OSMOLALITY: 307 (ref 275–301)
POTASSIUM: 3.6 mmol/L (ref 3.5–5.1)
Sodium: 141 mmol/L (ref 136–145)

## 2014-02-11 ENCOUNTER — Ambulatory Visit: Payer: Self-pay | Admitting: Internal Medicine

## 2014-02-11 LAB — CREATININE, URINE, RANDOM: Creatinine, Urine Random: 57.7 mg/dL (ref 30.0–125.0)

## 2014-02-12 LAB — CBC WITH DIFFERENTIAL/PLATELET
BASOS ABS: 0.3 10*3/uL — AB (ref 0.0–0.1)
BASOS PCT: 2.5 %
EOS PCT: 2.7 %
Eosinophil #: 0.4 10*3/uL (ref 0.0–0.7)
HCT: 30.7 % — ABNORMAL LOW (ref 40.0–52.0)
HGB: 9.5 g/dL — AB (ref 13.0–18.0)
Lymphocyte #: 3.6 10*3/uL (ref 1.0–3.6)
Lymphocyte %: 26.5 %
MCH: 23.2 pg — AB (ref 26.0–34.0)
MCHC: 31.1 g/dL — ABNORMAL LOW (ref 32.0–36.0)
MCV: 75 fL — ABNORMAL LOW (ref 80–100)
MONO ABS: 1.1 x10 3/mm — AB (ref 0.2–1.0)
Monocyte %: 8.1 %
Neutrophil #: 8.2 10*3/uL — ABNORMAL HIGH (ref 1.4–6.5)
Neutrophil %: 60.2 %
Platelet: 399 10*3/uL (ref 150–440)
RBC: 4.11 10*6/uL — ABNORMAL LOW (ref 4.40–5.90)
RDW: 15 % — ABNORMAL HIGH (ref 11.5–14.5)
WBC: 13.6 10*3/uL — AB (ref 3.8–10.6)

## 2014-02-12 LAB — CREATININE CLEARANCE, URINE, 24 HOUR
COLLECTION HOURS: 24 h
Creatinine Clearance: 12 mL/min — ABNORMAL LOW (ref 80–130)
Creatinine, Serum: 2.27 mg/dL — ABNORMAL HIGH (ref 0.50–1.20)
Creatinine, Urine: 61.9 mg/dL (ref 30.0–125.0)
TOTAL VOLUME C4TV(ML): 700 mL

## 2014-02-12 LAB — RENAL FUNCTION PANEL
ANION GAP: 9 (ref 7–16)
Albumin: 1.4 g/dL — ABNORMAL LOW (ref 3.4–5.0)
BUN: 59 mg/dL — ABNORMAL HIGH (ref 7–18)
CALCIUM: 7.8 mg/dL — AB (ref 8.5–10.1)
CHLORIDE: 103 mmol/L (ref 98–107)
Co2: 30 mmol/L (ref 21–32)
Creatinine: 2.27 mg/dL — ABNORMAL HIGH (ref 0.60–1.30)
EGFR (African American): 36 — ABNORMAL LOW
EGFR (Non-African Amer.): 31 — ABNORMAL LOW
Glucose: 162 mg/dL — ABNORMAL HIGH (ref 65–99)
Osmolality: 303 (ref 275–301)
POTASSIUM: 3.7 mmol/L (ref 3.5–5.1)
Phosphorus: 4.3 mg/dL (ref 2.5–4.9)
SODIUM: 142 mmol/L (ref 136–145)

## 2014-02-12 LAB — EXPECTORATED SPUTUM ASSESSMENT W GRAM STAIN, RFLX TO RESP C

## 2014-02-14 LAB — BASIC METABOLIC PANEL
Anion Gap: 9 (ref 7–16)
BUN: 78 mg/dL — ABNORMAL HIGH (ref 7–18)
Calcium, Total: 7.8 mg/dL — ABNORMAL LOW (ref 8.5–10.1)
Chloride: 107 mmol/L (ref 98–107)
Co2: 29 mmol/L (ref 21–32)
Creatinine: 2.49 mg/dL — ABNORMAL HIGH (ref 0.60–1.30)
EGFR (African American): 32 — ABNORMAL LOW
EGFR (Non-African Amer.): 28 — ABNORMAL LOW
GLUCOSE: 172 mg/dL — AB (ref 65–99)
OSMOLALITY: 316 (ref 275–301)
POTASSIUM: 3.4 mmol/L — AB (ref 3.5–5.1)
Sodium: 145 mmol/L (ref 136–145)

## 2014-02-15 LAB — CREATININE, SERUM
Creatinine: 2.37 mg/dL — ABNORMAL HIGH
EGFR (African American): 34 — ABNORMAL LOW
EGFR (Non-African Amer.): 30 — ABNORMAL LOW

## 2014-02-15 LAB — RENAL FUNCTION PANEL
ALBUMIN: 1.5 g/dL — AB (ref 3.4–5.0)
ANION GAP: 9 (ref 7–16)
BUN: 84 mg/dL — AB (ref 7–18)
CREATININE: 2.54 mg/dL — AB (ref 0.60–1.30)
Calcium, Total: 8 mg/dL — ABNORMAL LOW (ref 8.5–10.1)
Chloride: 107 mmol/L (ref 98–107)
Co2: 28 mmol/L (ref 21–32)
EGFR (Non-African Amer.): 27 — ABNORMAL LOW
GFR CALC AF AMER: 31 — AB
Glucose: 199 mg/dL — ABNORMAL HIGH (ref 65–99)
OSMOLALITY: 318 (ref 275–301)
POTASSIUM: 3.8 mmol/L (ref 3.5–5.1)
Phosphorus: 4 mg/dL (ref 2.5–4.9)
SODIUM: 144 mmol/L (ref 136–145)

## 2014-02-16 LAB — RENAL FUNCTION PANEL
ANION GAP: 8 (ref 7–16)
Albumin: 1.6 g/dL — ABNORMAL LOW (ref 3.4–5.0)
BUN: 78 mg/dL — AB (ref 7–18)
CO2: 28 mmol/L (ref 21–32)
Calcium, Total: 7.9 mg/dL — ABNORMAL LOW (ref 8.5–10.1)
Chloride: 107 mmol/L (ref 98–107)
Creatinine: 2.6 mg/dL — ABNORMAL HIGH (ref 0.60–1.30)
EGFR (African American): 31 — ABNORMAL LOW
EGFR (Non-African Amer.): 26 — ABNORMAL LOW
GLUCOSE: 253 mg/dL — AB (ref 65–99)
OSMOLALITY: 317 (ref 275–301)
Phosphorus: 4 mg/dL (ref 2.5–4.9)
Potassium: 3.6 mmol/L (ref 3.5–5.1)
Sodium: 143 mmol/L (ref 136–145)

## 2014-02-16 LAB — CREATININE CLEARANCE, URINE, 24 HOUR
CREATININE: 2.6 mg/dL — AB (ref 0.50–1.20)
Collection Hours: 24 hours
Creatinine Clearance: 12 mL/min — ABNORMAL LOW (ref 80–130)
Creatinine, Urine: 44.3 mg/dL (ref 30.0–125.0)
Total Volume: 1100 mL

## 2014-02-17 LAB — CBC WITH DIFFERENTIAL/PLATELET
BASOS ABS: 0.2 10*3/uL — AB (ref 0.0–0.1)
BASOS PCT: 2.1 %
Basophil #: 0.3 10*3/uL — ABNORMAL HIGH (ref 0.0–0.1)
Basophil %: 1.5 %
EOS PCT: 7.1 %
EOS PCT: 7.8 %
Eosinophil #: 0.9 10*3/uL — ABNORMAL HIGH (ref 0.0–0.7)
Eosinophil #: 1 10*3/uL — ABNORMAL HIGH (ref 0.0–0.7)
HCT: 29.6 % — AB (ref 40.0–52.0)
HCT: 30.9 % — AB (ref 40.0–52.0)
HGB: 9.6 g/dL — ABNORMAL LOW (ref 13.0–18.0)
HGB: 9.6 g/dL — ABNORMAL LOW (ref 13.0–18.0)
LYMPHS ABS: 4 10*3/uL — AB (ref 1.0–3.6)
LYMPHS PCT: 29.6 %
Lymphocyte #: 3.9 10*3/uL — ABNORMAL HIGH (ref 1.0–3.6)
Lymphocyte %: 31.7 %
MCH: 24.2 pg — ABNORMAL LOW (ref 26.0–34.0)
MCH: 23.2 pg — ABNORMAL LOW (ref 26.0–34.0)
MCHC: 32.5 g/dL (ref 32.0–36.0)
MCHC: 31.1 g/dL — AB (ref 32.0–36.0)
MCV: 74 fL — AB (ref 80–100)
MCV: 75 fL — ABNORMAL LOW (ref 80–100)
MONOS PCT: 10.3 %
Monocyte #: 1.4 x10 3/mm — ABNORMAL HIGH (ref 0.2–1.0)
Monocyte #: 1.2 x10 3/mm — ABNORMAL HIGH (ref 0.2–1.0)
Monocyte %: 9.7 %
NEUTROS PCT: 48.7 %
Neutrophil #: 6.9 10*3/uL — ABNORMAL HIGH (ref 1.4–6.5)
Neutrophil #: 6.1 10*3/uL (ref 1.4–6.5)
Neutrophil %: 51.5 %
PLATELETS: 522 10*3/uL — AB (ref 150–440)
Platelet: 516 10*3/uL — ABNORMAL HIGH (ref 150–440)
RBC: 3.99 10*6/uL — ABNORMAL LOW (ref 4.40–5.90)
RBC: 4.14 10*6/uL — ABNORMAL LOW (ref 4.40–5.90)
RDW: 16.6 % — AB (ref 11.5–14.5)
RDW: 16.1 % — AB (ref 11.5–14.5)
WBC: 13.3 10*3/uL — ABNORMAL HIGH (ref 3.8–10.6)
WBC: 12.6 10*3/uL — AB (ref 3.8–10.6)

## 2014-02-17 LAB — BASIC METABOLIC PANEL
ANION GAP: 8 (ref 7–16)
BUN: 93 mg/dL — ABNORMAL HIGH (ref 7–18)
CHLORIDE: 107 mmol/L (ref 98–107)
CO2: 28 mmol/L (ref 21–32)
Calcium, Total: 7.9 mg/dL — ABNORMAL LOW (ref 8.5–10.1)
Creatinine: 2.61 mg/dL — ABNORMAL HIGH (ref 0.60–1.30)
EGFR (African American): 30 — ABNORMAL LOW
EGFR (Non-African Amer.): 26 — ABNORMAL LOW
Glucose: 227 mg/dL — ABNORMAL HIGH (ref 65–99)
Osmolality: 321 (ref 275–301)
POTASSIUM: 3.7 mmol/L (ref 3.5–5.1)
Sodium: 143 mmol/L (ref 136–145)

## 2014-02-17 LAB — PHOSPHORUS: Phosphorus: 3.7 mg/dL (ref 2.5–4.9)

## 2014-02-18 LAB — BASIC METABOLIC PANEL
Anion Gap: 9 (ref 7–16)
BUN: 67 mg/dL — ABNORMAL HIGH (ref 7–18)
Calcium, Total: 8 mg/dL — ABNORMAL LOW (ref 8.5–10.1)
Chloride: 103 mmol/L (ref 98–107)
Co2: 30 mmol/L (ref 21–32)
Creatinine: 2.1 mg/dL — ABNORMAL HIGH (ref 0.60–1.30)
EGFR (African American): 40 — ABNORMAL LOW
GFR CALC NON AF AMER: 34 — AB
Glucose: 233 mg/dL — ABNORMAL HIGH (ref 65–99)
Osmolality: 310 (ref 275–301)
Potassium: 3.7 mmol/L (ref 3.5–5.1)
Sodium: 142 mmol/L (ref 136–145)

## 2014-02-19 LAB — PHOSPHORUS: PHOSPHORUS: 2.9 mg/dL (ref 2.5–4.9)

## 2014-02-20 LAB — CULTURE, BLOOD (SINGLE)

## 2014-02-21 LAB — PLATELET COUNT: Platelet: 255 10*3/uL (ref 150–440)

## 2014-02-22 LAB — CBC WITH DIFFERENTIAL/PLATELET
Basophil #: 0 10*3/uL (ref 0.0–0.1)
Basophil %: 0.4 %
EOS PCT: 5.2 %
Eosinophil #: 0.5 10*3/uL (ref 0.0–0.7)
HCT: 28.6 % — ABNORMAL LOW (ref 40.0–52.0)
HGB: 9.3 g/dL — ABNORMAL LOW (ref 13.0–18.0)
LYMPHS ABS: 3.8 10*3/uL — AB (ref 1.0–3.6)
Lymphocyte %: 39.5 %
MCH: 24.1 pg — AB (ref 26.0–34.0)
MCHC: 32.5 g/dL (ref 32.0–36.0)
MCV: 74 fL — ABNORMAL LOW (ref 80–100)
MONOS PCT: 13.9 %
Monocyte #: 1.4 x10 3/mm — ABNORMAL HIGH (ref 0.2–1.0)
NEUTROS PCT: 41 %
Neutrophil #: 4 10*3/uL (ref 1.4–6.5)
Platelet: 227 10*3/uL (ref 150–440)
RBC: 3.85 10*6/uL — ABNORMAL LOW (ref 4.40–5.90)
RDW: 16.6 % — ABNORMAL HIGH (ref 11.5–14.5)
WBC: 9.7 10*3/uL (ref 3.8–10.6)

## 2014-02-24 LAB — CBC WITH DIFFERENTIAL/PLATELET
BASOS ABS: 0.1 10*3/uL (ref 0.0–0.1)
BASOS PCT: 1.4 %
EOS ABS: 0.4 10*3/uL (ref 0.0–0.7)
Eosinophil %: 5.3 %
HCT: 27 % — ABNORMAL LOW (ref 40.0–52.0)
HGB: 8.6 g/dL — ABNORMAL LOW (ref 13.0–18.0)
LYMPHS ABS: 3 10*3/uL (ref 1.0–3.6)
Lymphocyte %: 37.6 %
MCH: 23.9 pg — ABNORMAL LOW (ref 26.0–34.0)
MCHC: 31.9 g/dL — ABNORMAL LOW (ref 32.0–36.0)
MCV: 75 fL — AB (ref 80–100)
MONO ABS: 0.9 x10 3/mm (ref 0.2–1.0)
MONOS PCT: 10.9 %
NEUTROS PCT: 44.8 %
Neutrophil #: 3.6 10*3/uL (ref 1.4–6.5)
PLATELETS: 240 10*3/uL (ref 150–440)
RBC: 3.61 10*6/uL — ABNORMAL LOW (ref 4.40–5.90)
RDW: 16.3 % — ABNORMAL HIGH (ref 11.5–14.5)
WBC: 8.1 10*3/uL (ref 3.8–10.6)

## 2014-02-24 LAB — RENAL FUNCTION PANEL
ALBUMIN: 1.7 g/dL — AB (ref 3.4–5.0)
Anion Gap: 7 (ref 7–16)
BUN: 55 mg/dL — ABNORMAL HIGH (ref 7–18)
CHLORIDE: 104 mmol/L (ref 98–107)
Calcium, Total: 7.7 mg/dL — ABNORMAL LOW (ref 8.5–10.1)
Co2: 29 mmol/L (ref 21–32)
Creatinine: 2.09 mg/dL — ABNORMAL HIGH (ref 0.60–1.30)
EGFR (African American): 40 — ABNORMAL LOW
GFR CALC NON AF AMER: 34 — AB
GLUCOSE: 215 mg/dL — AB (ref 65–99)
Osmolality: 301 (ref 275–301)
Phosphorus: 2.8 mg/dL (ref 2.5–4.9)
Potassium: 4.1 mmol/L (ref 3.5–5.1)
SODIUM: 140 mmol/L (ref 136–145)

## 2014-02-25 LAB — PLATELET COUNT: PLATELETS: 239 10*3/uL (ref 150–440)

## 2014-02-26 LAB — CBC WITH DIFFERENTIAL/PLATELET
BASOS ABS: 0.2 10*3/uL — AB (ref 0.0–0.1)
Basophil %: 2.1 %
EOS PCT: 3.1 %
Eosinophil #: 0.3 10*3/uL (ref 0.0–0.7)
HCT: 25.6 % — ABNORMAL LOW (ref 40.0–52.0)
HGB: 8 g/dL — AB (ref 13.0–18.0)
Lymphocyte #: 3.3 10*3/uL (ref 1.0–3.6)
Lymphocyte %: 36.7 %
MCH: 23.5 pg — AB (ref 26.0–34.0)
MCHC: 31.1 g/dL — ABNORMAL LOW (ref 32.0–36.0)
MCV: 75 fL — ABNORMAL LOW (ref 80–100)
MONO ABS: 1 x10 3/mm (ref 0.2–1.0)
Monocyte %: 11 %
Neutrophil #: 4.3 10*3/uL (ref 1.4–6.5)
Neutrophil %: 47.1 %
Platelet: 250 10*3/uL (ref 150–440)
RBC: 3.4 10*6/uL — ABNORMAL LOW (ref 4.40–5.90)
RDW: 17 % — ABNORMAL HIGH (ref 11.5–14.5)
WBC: 9.1 10*3/uL (ref 3.8–10.6)

## 2014-02-26 LAB — RENAL FUNCTION PANEL
ANION GAP: 5 — AB (ref 7–16)
Albumin: 1.8 g/dL — ABNORMAL LOW (ref 3.4–5.0)
BUN: 42 mg/dL — ABNORMAL HIGH (ref 7–18)
CREATININE: 1.96 mg/dL — AB (ref 0.60–1.30)
Calcium, Total: 7.7 mg/dL — ABNORMAL LOW (ref 8.5–10.1)
Chloride: 101 mmol/L (ref 98–107)
Co2: 31 mmol/L (ref 21–32)
EGFR (Non-African Amer.): 37 — ABNORMAL LOW
GFR CALC AF AMER: 43 — AB
GLUCOSE: 190 mg/dL — AB (ref 65–99)
OSMOLALITY: 289 (ref 275–301)
Phosphorus: 1.8 mg/dL — ABNORMAL LOW (ref 2.5–4.9)
Potassium: 3.6 mmol/L (ref 3.5–5.1)
SODIUM: 137 mmol/L (ref 136–145)

## 2014-02-28 LAB — CBC WITH DIFFERENTIAL/PLATELET
Basophil #: 0.3 10*3/uL — ABNORMAL HIGH (ref 0.0–0.1)
Basophil %: 2.7 %
EOS PCT: 4.8 %
Eosinophil #: 0.5 10*3/uL (ref 0.0–0.7)
HCT: 27.7 % — AB (ref 40.0–52.0)
HGB: 8.8 g/dL — AB (ref 13.0–18.0)
LYMPHS ABS: 2.4 10*3/uL (ref 1.0–3.6)
Lymphocyte %: 23.4 %
MCH: 23.9 pg — ABNORMAL LOW (ref 26.0–34.0)
MCHC: 31.8 g/dL — ABNORMAL LOW (ref 32.0–36.0)
MCV: 75 fL — ABNORMAL LOW (ref 80–100)
MONO ABS: 0.9 x10 3/mm (ref 0.2–1.0)
MONOS PCT: 8.3 %
NEUTROS ABS: 6.2 10*3/uL (ref 1.4–6.5)
NEUTROS PCT: 60.8 %
PLATELETS: 254 10*3/uL (ref 150–440)
RBC: 3.68 10*6/uL — AB (ref 4.40–5.90)
RDW: 17.8 % — ABNORMAL HIGH (ref 11.5–14.5)
WBC: 10.3 10*3/uL (ref 3.8–10.6)

## 2014-02-28 LAB — RENAL FUNCTION PANEL
ALBUMIN: 1.9 g/dL — AB (ref 3.4–5.0)
ANION GAP: 7 (ref 7–16)
BUN: 34 mg/dL — ABNORMAL HIGH (ref 7–18)
CALCIUM: 8.2 mg/dL — AB (ref 8.5–10.1)
Chloride: 98 mmol/L (ref 98–107)
Co2: 31 mmol/L (ref 21–32)
Creatinine: 1.76 mg/dL — ABNORMAL HIGH (ref 0.60–1.30)
EGFR (African American): 49 — ABNORMAL LOW
GFR CALC NON AF AMER: 42 — AB
GLUCOSE: 203 mg/dL — AB (ref 65–99)
OSMOLALITY: 285 (ref 275–301)
PHOSPHORUS: 2.3 mg/dL — AB (ref 2.5–4.9)
Potassium: 3.3 mmol/L — ABNORMAL LOW (ref 3.5–5.1)
Sodium: 136 mmol/L (ref 136–145)

## 2014-02-28 LAB — BASIC METABOLIC PANEL
Anion Gap: 5 — ABNORMAL LOW (ref 7–16)
BUN: 34 mg/dL — ABNORMAL HIGH (ref 7–18)
CALCIUM: 8 mg/dL — AB (ref 8.5–10.1)
Chloride: 98 mmol/L (ref 98–107)
Co2: 33 mmol/L — ABNORMAL HIGH (ref 21–32)
Creatinine: 1.85 mg/dL — ABNORMAL HIGH (ref 0.60–1.30)
EGFR (African American): 46 — ABNORMAL LOW
EGFR (Non-African Amer.): 40 — ABNORMAL LOW
GLUCOSE: 248 mg/dL — AB (ref 65–99)
Osmolality: 288 (ref 275–301)
Potassium: 3.6 mmol/L (ref 3.5–5.1)
SODIUM: 136 mmol/L (ref 136–145)

## 2014-03-03 LAB — RENAL FUNCTION PANEL
ANION GAP: 5 — AB (ref 7–16)
Albumin: 1.8 g/dL — ABNORMAL LOW (ref 3.4–5.0)
BUN: 35 mg/dL — ABNORMAL HIGH (ref 7–18)
Calcium, Total: 8.1 mg/dL — ABNORMAL LOW (ref 8.5–10.1)
Chloride: 98 mmol/L (ref 98–107)
Co2: 32 mmol/L (ref 21–32)
Creatinine: 2.04 mg/dL — ABNORMAL HIGH (ref 0.60–1.30)
GFR CALC AF AMER: 41 — AB
GFR CALC NON AF AMER: 35 — AB
Glucose: 186 mg/dL — ABNORMAL HIGH (ref 65–99)
Osmolality: 283 (ref 275–301)
PHOSPHORUS: 2.7 mg/dL (ref 2.5–4.9)
Potassium: 3.7 mmol/L (ref 3.5–5.1)
Sodium: 135 mmol/L — ABNORMAL LOW (ref 136–145)

## 2014-03-03 LAB — CBC WITH DIFFERENTIAL/PLATELET
BASOS PCT: 1.5 %
Basophil #: 0.2 10*3/uL — ABNORMAL HIGH (ref 0.0–0.1)
EOS ABS: 0.5 10*3/uL (ref 0.0–0.7)
Eosinophil %: 4.4 %
HCT: 26.5 % — ABNORMAL LOW (ref 40.0–52.0)
HGB: 8.6 g/dL — ABNORMAL LOW (ref 13.0–18.0)
Lymphocyte #: 2.8 10*3/uL (ref 1.0–3.6)
Lymphocyte %: 26 %
MCH: 24.3 pg — ABNORMAL LOW (ref 26.0–34.0)
MCHC: 32.4 g/dL (ref 32.0–36.0)
MCV: 75 fL — ABNORMAL LOW (ref 80–100)
MONO ABS: 1.1 x10 3/mm — AB (ref 0.2–1.0)
MONOS PCT: 9.9 %
NEUTROS PCT: 58.2 %
Neutrophil #: 6.2 10*3/uL (ref 1.4–6.5)
PLATELETS: 238 10*3/uL (ref 150–440)
RBC: 3.53 10*6/uL — ABNORMAL LOW (ref 4.40–5.90)
RDW: 18.4 % — AB (ref 11.5–14.5)
WBC: 10.7 10*3/uL — ABNORMAL HIGH (ref 3.8–10.6)

## 2014-03-04 LAB — BASIC METABOLIC PANEL
Anion Gap: 4 — ABNORMAL LOW (ref 7–16)
BUN: 24 mg/dL — ABNORMAL HIGH (ref 7–18)
CHLORIDE: 101 mmol/L (ref 98–107)
CREATININE: 1.61 mg/dL — AB (ref 0.60–1.30)
Calcium, Total: 7.8 mg/dL — ABNORMAL LOW (ref 8.5–10.1)
Co2: 33 mmol/L — ABNORMAL HIGH (ref 21–32)
EGFR (Non-African Amer.): 47 — ABNORMAL LOW
GFR CALC AF AMER: 55 — AB
GLUCOSE: 235 mg/dL — AB (ref 65–99)
OSMOLALITY: 287 (ref 275–301)
Potassium: 3.5 mmol/L (ref 3.5–5.1)
Sodium: 138 mmol/L (ref 136–145)

## 2014-03-04 LAB — CBC WITH DIFFERENTIAL/PLATELET
Basophil #: 0.2 10*3/uL — ABNORMAL HIGH (ref 0.0–0.1)
Basophil %: 2.2 %
Eosinophil #: 0.5 10*3/uL (ref 0.0–0.7)
Eosinophil %: 5.1 %
HCT: 23.6 % — ABNORMAL LOW (ref 40.0–52.0)
HGB: 7.5 g/dL — AB (ref 13.0–18.0)
Lymphocyte #: 2.7 10*3/uL (ref 1.0–3.6)
Lymphocyte %: 29.4 %
MCH: 24.1 pg — ABNORMAL LOW (ref 26.0–34.0)
MCHC: 31.9 g/dL — ABNORMAL LOW (ref 32.0–36.0)
MCV: 76 fL — ABNORMAL LOW (ref 80–100)
MONO ABS: 0.9 x10 3/mm (ref 0.2–1.0)
Monocyte %: 9.5 %
NEUTROS ABS: 4.9 10*3/uL (ref 1.4–6.5)
Neutrophil %: 53.8 %
PLATELETS: 198 10*3/uL (ref 150–440)
RBC: 3.12 10*6/uL — AB (ref 4.40–5.90)
RDW: 18.2 % — ABNORMAL HIGH (ref 11.5–14.5)
WBC: 9.1 10*3/uL (ref 3.8–10.6)

## 2014-03-05 LAB — BASIC METABOLIC PANEL
ANION GAP: 6 — AB (ref 7–16)
BUN: 33 mg/dL — ABNORMAL HIGH (ref 7–18)
CALCIUM: 8.1 mg/dL — AB (ref 8.5–10.1)
Chloride: 99 mmol/L (ref 98–107)
Co2: 32 mmol/L (ref 21–32)
Creatinine: 2.06 mg/dL — ABNORMAL HIGH (ref 0.60–1.30)
EGFR (Non-African Amer.): 35 — ABNORMAL LOW
GFR CALC AF AMER: 41 — AB
GLUCOSE: 213 mg/dL — AB (ref 65–99)
Osmolality: 287 (ref 275–301)
Potassium: 3.2 mmol/L — ABNORMAL LOW (ref 3.5–5.1)
Sodium: 137 mmol/L (ref 136–145)

## 2014-03-05 LAB — CBC WITH DIFFERENTIAL/PLATELET
BASOS ABS: 0.1 10*3/uL (ref 0.0–0.1)
Basophil %: 1.1 %
EOS PCT: 3.5 %
Eosinophil #: 0.4 10*3/uL (ref 0.0–0.7)
HCT: 23.3 % — AB (ref 40.0–52.0)
HGB: 7.4 g/dL — ABNORMAL LOW (ref 13.0–18.0)
LYMPHS PCT: 31.1 %
Lymphocyte #: 3.3 10*3/uL (ref 1.0–3.6)
MCH: 24 pg — ABNORMAL LOW (ref 26.0–34.0)
MCHC: 31.6 g/dL — ABNORMAL LOW (ref 32.0–36.0)
MCV: 76 fL — AB (ref 80–100)
Monocyte #: 1 x10 3/mm (ref 0.2–1.0)
Monocyte %: 9.3 %
Neutrophil #: 5.8 10*3/uL (ref 1.4–6.5)
Neutrophil %: 55 %
Platelet: 203 10*3/uL (ref 150–440)
RBC: 3.07 10*6/uL — AB (ref 4.40–5.90)
RDW: 18.7 % — ABNORMAL HIGH (ref 11.5–14.5)
WBC: 10.5 10*3/uL (ref 3.8–10.6)

## 2014-03-05 LAB — PHOSPHORUS: Phosphorus: 3.7 mg/dL (ref 2.5–4.9)

## 2014-03-06 LAB — CBC WITH DIFFERENTIAL/PLATELET
BASOS ABS: 0.1 10*3/uL (ref 0.0–0.1)
BASOS PCT: 1.2 %
Eosinophil #: 0.5 10*3/uL (ref 0.0–0.7)
Eosinophil %: 4.7 %
HCT: 23.2 % — ABNORMAL LOW (ref 40.0–52.0)
HGB: 7.4 g/dL — ABNORMAL LOW (ref 13.0–18.0)
LYMPHS PCT: 28.8 %
Lymphocyte #: 3 10*3/uL (ref 1.0–3.6)
MCH: 24.4 pg — AB (ref 26.0–34.0)
MCHC: 32 g/dL (ref 32.0–36.0)
MCV: 76 fL — AB (ref 80–100)
Monocyte #: 1 x10 3/mm (ref 0.2–1.0)
Monocyte %: 9.7 %
NEUTROS PCT: 55.6 %
Neutrophil #: 5.9 10*3/uL (ref 1.4–6.5)
PLATELETS: 182 10*3/uL (ref 150–440)
RBC: 3.03 10*6/uL — AB (ref 4.40–5.90)
RDW: 19.1 % — AB (ref 11.5–14.5)
WBC: 10.5 10*3/uL (ref 3.8–10.6)

## 2014-03-06 LAB — BASIC METABOLIC PANEL
Anion Gap: 4 — ABNORMAL LOW (ref 7–16)
BUN: 21 mg/dL — ABNORMAL HIGH (ref 7–18)
CHLORIDE: 102 mmol/L (ref 98–107)
Calcium, Total: 7.7 mg/dL — ABNORMAL LOW (ref 8.5–10.1)
Co2: 34 mmol/L — ABNORMAL HIGH (ref 21–32)
Creatinine: 1.67 mg/dL — ABNORMAL HIGH (ref 0.60–1.30)
EGFR (Non-African Amer.): 45 — ABNORMAL LOW
GFR CALC AF AMER: 52 — AB
Glucose: 190 mg/dL — ABNORMAL HIGH (ref 65–99)
Osmolality: 287 (ref 275–301)
POTASSIUM: 3.4 mmol/L — AB (ref 3.5–5.1)
Sodium: 140 mmol/L (ref 136–145)

## 2014-03-07 LAB — RENAL FUNCTION PANEL
ALBUMIN: 1.9 g/dL — AB (ref 3.4–5.0)
Anion Gap: 7 (ref 7–16)
BUN: 29 mg/dL — ABNORMAL HIGH (ref 7–18)
CHLORIDE: 100 mmol/L (ref 98–107)
CREATININE: 2 mg/dL — AB (ref 0.60–1.30)
Calcium, Total: 8.2 mg/dL — ABNORMAL LOW (ref 8.5–10.1)
Co2: 31 mmol/L (ref 21–32)
EGFR (African American): 42 — ABNORMAL LOW
EGFR (Non-African Amer.): 36 — ABNORMAL LOW
GLUCOSE: 175 mg/dL — AB (ref 65–99)
Osmolality: 286 (ref 275–301)
PHOSPHORUS: 2.8 mg/dL (ref 2.5–4.9)
Potassium: 3.6 mmol/L (ref 3.5–5.1)
Sodium: 138 mmol/L (ref 136–145)

## 2014-03-07 LAB — CBC WITH DIFFERENTIAL/PLATELET
Basophil #: 0.2 10*3/uL — ABNORMAL HIGH (ref 0.0–0.1)
Basophil %: 2.3 %
EOS ABS: 0.6 10*3/uL (ref 0.0–0.7)
Eosinophil %: 6.6 %
HCT: 26.2 % — ABNORMAL LOW (ref 40.0–52.0)
HGB: 8.1 g/dL — ABNORMAL LOW (ref 13.0–18.0)
LYMPHS ABS: 2.6 10*3/uL (ref 1.0–3.6)
Lymphocyte %: 26.9 %
MCH: 23.8 pg — ABNORMAL LOW (ref 26.0–34.0)
MCHC: 31.1 g/dL — ABNORMAL LOW (ref 32.0–36.0)
MCV: 77 fL — AB (ref 80–100)
Monocyte #: 1 x10 3/mm (ref 0.2–1.0)
Monocyte %: 10.3 %
NEUTROS PCT: 53.9 %
Neutrophil #: 5.3 10*3/uL (ref 1.4–6.5)
Platelet: 198 10*3/uL (ref 150–440)
RBC: 3.42 10*6/uL — ABNORMAL LOW (ref 4.40–5.90)
RDW: 19.3 % — ABNORMAL HIGH (ref 11.5–14.5)
WBC: 9.8 10*3/uL (ref 3.8–10.6)

## 2014-03-11 LAB — CBC WITH DIFFERENTIAL/PLATELET
BASOS ABS: 0.1 10*3/uL (ref 0.0–0.1)
Basophil %: 0.9 %
EOS ABS: 0.9 10*3/uL — AB (ref 0.0–0.7)
Eosinophil %: 9 %
HCT: 25.6 % — AB (ref 40.0–52.0)
HGB: 7.9 g/dL — ABNORMAL LOW (ref 13.0–18.0)
LYMPHS PCT: 29 %
Lymphocyte #: 2.9 10*3/uL (ref 1.0–3.6)
MCH: 24 pg — ABNORMAL LOW (ref 26.0–34.0)
MCHC: 31 g/dL — AB (ref 32.0–36.0)
MCV: 77 fL — AB (ref 80–100)
MONO ABS: 0.7 x10 3/mm (ref 0.2–1.0)
MONOS PCT: 7.2 %
NEUTROS PCT: 53.9 %
Neutrophil #: 5.4 10*3/uL (ref 1.4–6.5)
Platelet: 206 10*3/uL (ref 150–440)
RBC: 3.32 10*6/uL — ABNORMAL LOW (ref 4.40–5.90)
RDW: 19.5 % — AB (ref 11.5–14.5)
WBC: 10 10*3/uL (ref 3.8–10.6)

## 2014-03-11 LAB — RENAL FUNCTION PANEL
ALBUMIN: 2 g/dL — AB (ref 3.4–5.0)
ANION GAP: 7 (ref 7–16)
BUN: 38 mg/dL — ABNORMAL HIGH (ref 7–18)
CO2: 32 mmol/L (ref 21–32)
Calcium, Total: 7.9 mg/dL — ABNORMAL LOW (ref 8.5–10.1)
Chloride: 97 mmol/L — ABNORMAL LOW (ref 98–107)
Creatinine: 2.07 mg/dL — ABNORMAL HIGH (ref 0.60–1.30)
EGFR (African American): 40 — ABNORMAL LOW
GFR CALC NON AF AMER: 35 — AB
Glucose: 205 mg/dL — ABNORMAL HIGH (ref 65–99)
OSMOLALITY: 287 (ref 275–301)
POTASSIUM: 3.6 mmol/L (ref 3.5–5.1)
Phosphorus: 3.2 mg/dL (ref 2.5–4.9)
SODIUM: 136 mmol/L (ref 136–145)

## 2014-03-12 LAB — PLATELET COUNT: PLATELETS: 156 10*3/uL (ref 150–440)

## 2014-03-13 LAB — CBC WITH DIFFERENTIAL/PLATELET
BASOS PCT: 1.1 %
Basophil #: 0.1 10*3/uL (ref 0.0–0.1)
EOS PCT: 8.7 %
Eosinophil #: 0.9 10*3/uL — ABNORMAL HIGH (ref 0.0–0.7)
HCT: 25.7 % — ABNORMAL LOW (ref 40.0–52.0)
HGB: 8.1 g/dL — AB (ref 13.0–18.0)
Lymphocyte #: 2.2 10*3/uL (ref 1.0–3.6)
Lymphocyte %: 21.9 %
MCH: 24.4 pg — AB (ref 26.0–34.0)
MCHC: 31.6 g/dL — ABNORMAL LOW (ref 32.0–36.0)
MCV: 77 fL — AB (ref 80–100)
MONO ABS: 0.8 x10 3/mm (ref 0.2–1.0)
Monocyte %: 7.8 %
NEUTROS ABS: 6.1 10*3/uL (ref 1.4–6.5)
Neutrophil %: 60.5 %
Platelet: 186 10*3/uL (ref 150–440)
RBC: 3.32 10*6/uL — ABNORMAL LOW (ref 4.40–5.90)
RDW: 19.4 % — AB (ref 11.5–14.5)
WBC: 10 10*3/uL (ref 3.8–10.6)

## 2014-03-13 LAB — RENAL FUNCTION PANEL
ALBUMIN: 2.1 g/dL — AB (ref 3.4–5.0)
ANION GAP: 7 (ref 7–16)
BUN: 39 mg/dL — ABNORMAL HIGH (ref 7–18)
CALCIUM: 8.4 mg/dL — AB (ref 8.5–10.1)
Chloride: 96 mmol/L — ABNORMAL LOW (ref 98–107)
Co2: 33 mmol/L — ABNORMAL HIGH (ref 21–32)
Creatinine: 1.96 mg/dL — ABNORMAL HIGH (ref 0.60–1.30)
EGFR (African American): 43 — ABNORMAL LOW
GFR CALC NON AF AMER: 37 — AB
Glucose: 194 mg/dL — ABNORMAL HIGH (ref 65–99)
Osmolality: 287 (ref 275–301)
Phosphorus: 3.3 mg/dL (ref 2.5–4.9)
Potassium: 3.3 mmol/L — ABNORMAL LOW (ref 3.5–5.1)
SODIUM: 136 mmol/L (ref 136–145)

## 2014-03-16 ENCOUNTER — Ambulatory Visit: Payer: Self-pay | Admitting: Internal Medicine

## 2014-03-16 LAB — PLATELET COUNT: PLATELETS: 218 10*3/uL (ref 150–440)

## 2014-03-17 LAB — CBC WITH DIFFERENTIAL/PLATELET
BASOS ABS: 0.1 10*3/uL (ref 0.0–0.1)
Basophil %: 1.3 %
EOS ABS: 0.7 10*3/uL (ref 0.0–0.7)
EOS PCT: 8.4 %
HCT: 24.6 % — ABNORMAL LOW (ref 40.0–52.0)
HGB: 7.6 g/dL — AB (ref 13.0–18.0)
LYMPHS ABS: 2.3 10*3/uL (ref 1.0–3.6)
Lymphocyte %: 27.6 %
MCH: 24.2 pg — ABNORMAL LOW (ref 26.0–34.0)
MCHC: 30.9 g/dL — AB (ref 32.0–36.0)
MCV: 78 fL — ABNORMAL LOW (ref 80–100)
Monocyte #: 0.8 x10 3/mm (ref 0.2–1.0)
Monocyte %: 9.8 %
NEUTROS ABS: 4.4 10*3/uL (ref 1.4–6.5)
Neutrophil %: 52.9 %
Platelet: 251 10*3/uL (ref 150–440)
RBC: 3.13 10*6/uL — ABNORMAL LOW (ref 4.40–5.90)
RDW: 18.3 % — ABNORMAL HIGH (ref 11.5–14.5)
WBC: 8.2 10*3/uL (ref 3.8–10.6)

## 2014-03-19 LAB — RENAL FUNCTION PANEL
ANION GAP: 6 — AB (ref 7–16)
Albumin: 2 g/dL — ABNORMAL LOW (ref 3.4–5.0)
BUN: 46 mg/dL — AB (ref 7–18)
CHLORIDE: 99 mmol/L (ref 98–107)
CREATININE: 2.24 mg/dL — AB (ref 0.60–1.30)
Calcium, Total: 8.2 mg/dL — ABNORMAL LOW (ref 8.5–10.1)
Co2: 33 mmol/L — ABNORMAL HIGH (ref 21–32)
EGFR (African American): 37 — ABNORMAL LOW
EGFR (Non-African Amer.): 32 — ABNORMAL LOW
GLUCOSE: 235 mg/dL — AB (ref 65–99)
OSMOLALITY: 295 (ref 275–301)
PHOSPHORUS: 3.5 mg/dL (ref 2.5–4.9)
POTASSIUM: 3.7 mmol/L (ref 3.5–5.1)
Sodium: 138 mmol/L (ref 136–145)

## 2014-03-19 LAB — PHOSPHORUS: Phosphorus: 3.7 mg/dL (ref 2.5–4.9)

## 2014-03-20 LAB — PLATELET COUNT: Platelet: 258 10*3/uL (ref 150–440)

## 2014-03-21 LAB — HEMOGLOBIN A1C: Hemoglobin A1C: 6.5 % — ABNORMAL HIGH (ref 4.2–6.3)

## 2014-04-05 ENCOUNTER — Other Ambulatory Visit: Payer: Self-pay | Admitting: Family Medicine

## 2014-04-09 LAB — EXPECTORATED SPUTUM ASSESSMENT W REFEX TO RESP CULTURE

## 2014-04-14 ENCOUNTER — Ambulatory Visit: Payer: Self-pay | Admitting: Internal Medicine

## 2014-04-14 ENCOUNTER — Ambulatory Visit: Payer: Self-pay | Admitting: Hospice and Palliative Medicine

## 2014-04-14 ENCOUNTER — Inpatient Hospital Stay: Payer: Self-pay | Admitting: Internal Medicine

## 2014-04-14 LAB — CBC
HCT: 23.7 % — AB (ref 40.0–52.0)
HGB: 7.6 g/dL — ABNORMAL LOW (ref 13.0–18.0)
MCH: 23.1 pg — AB (ref 26.0–34.0)
MCHC: 32 g/dL (ref 32.0–36.0)
MCV: 72 fL — AB (ref 80–100)
Platelet: 463 10*3/uL — ABNORMAL HIGH (ref 150–440)
RBC: 3.27 10*6/uL — ABNORMAL LOW (ref 4.40–5.90)
RDW: 16.3 % — ABNORMAL HIGH (ref 11.5–14.5)
WBC: 17.8 10*3/uL — ABNORMAL HIGH (ref 3.8–10.6)

## 2014-04-14 LAB — COMPREHENSIVE METABOLIC PANEL
ALBUMIN: 2.3 g/dL — AB (ref 3.4–5.0)
ALK PHOS: 153 U/L — AB
AST: 18 U/L (ref 15–37)
Anion Gap: 12 (ref 7–16)
BUN: 37 mg/dL — ABNORMAL HIGH (ref 7–18)
Bilirubin,Total: 0.3 mg/dL (ref 0.2–1.0)
Calcium, Total: 8.4 mg/dL — ABNORMAL LOW (ref 8.5–10.1)
Chloride: 95 mmol/L — ABNORMAL LOW (ref 98–107)
Co2: 27 mmol/L (ref 21–32)
Creatinine: 3.57 mg/dL — ABNORMAL HIGH (ref 0.60–1.30)
EGFR (African American): 21 — ABNORMAL LOW
GFR CALC NON AF AMER: 18 — AB
Glucose: 154 mg/dL — ABNORMAL HIGH (ref 65–99)
OSMOLALITY: 280 (ref 275–301)
Potassium: 3.3 mmol/L — ABNORMAL LOW (ref 3.5–5.1)
SGPT (ALT): 11 U/L — ABNORMAL LOW
SODIUM: 134 mmol/L — AB (ref 136–145)
Total Protein: 7.1 g/dL (ref 6.4–8.2)

## 2014-04-15 LAB — CBC WITH DIFFERENTIAL/PLATELET
Basophil #: 0.1 10*3/uL (ref 0.0–0.1)
Basophil %: 0.5 %
Eosinophil #: 0.6 10*3/uL (ref 0.0–0.7)
Eosinophil %: 3.8 %
HCT: 24.1 % — ABNORMAL LOW (ref 40.0–52.0)
HGB: 7.5 g/dL — ABNORMAL LOW (ref 13.0–18.0)
Lymphocyte #: 1.7 10*3/uL (ref 1.0–3.6)
Lymphocyte %: 11.1 %
MCH: 22.8 pg — ABNORMAL LOW (ref 26.0–34.0)
MCHC: 31.1 g/dL — ABNORMAL LOW (ref 32.0–36.0)
MCV: 73 fL — ABNORMAL LOW (ref 80–100)
Monocyte #: 1.6 x10 3/mm — ABNORMAL HIGH (ref 0.2–1.0)
Monocyte %: 10.6 %
Neutrophil #: 11.3 10*3/uL — ABNORMAL HIGH (ref 1.4–6.5)
Neutrophil %: 74 %
Platelet: 414 10*3/uL (ref 150–440)
RBC: 3.29 10*6/uL — ABNORMAL LOW (ref 4.40–5.90)
RDW: 15.5 % — ABNORMAL HIGH (ref 11.5–14.5)
WBC: 15.3 10*3/uL — ABNORMAL HIGH (ref 3.8–10.6)

## 2014-04-15 LAB — BASIC METABOLIC PANEL
Anion Gap: 11 (ref 7–16)
BUN: 37 mg/dL — ABNORMAL HIGH (ref 7–18)
Calcium, Total: 8.5 mg/dL (ref 8.5–10.1)
Chloride: 94 mmol/L — ABNORMAL LOW (ref 98–107)
Co2: 25 mmol/L (ref 21–32)
Creatinine: 3.87 mg/dL — ABNORMAL HIGH (ref 0.60–1.30)
EGFR (African American): 19 — ABNORMAL LOW
EGFR (Non-African Amer.): 16 — ABNORMAL LOW
Glucose: 305 mg/dL — ABNORMAL HIGH (ref 65–99)
Osmolality: 281 (ref 275–301)
Potassium: 3.2 mmol/L — ABNORMAL LOW (ref 3.5–5.1)
Sodium: 130 mmol/L — ABNORMAL LOW (ref 136–145)

## 2014-04-16 LAB — CBC WITH DIFFERENTIAL/PLATELET
BASOS ABS: 0.1 10*3/uL (ref 0.0–0.1)
Basophil %: 0.7 %
Eosinophil #: 0.7 10*3/uL (ref 0.0–0.7)
Eosinophil %: 4.2 %
HCT: 22.3 % — ABNORMAL LOW (ref 40.0–52.0)
HGB: 6.9 g/dL — ABNORMAL LOW (ref 13.0–18.0)
Lymphocyte #: 2.2 10*3/uL (ref 1.0–3.6)
Lymphocyte %: 13.9 %
MCH: 22.6 pg — AB (ref 26.0–34.0)
MCHC: 31 g/dL — AB (ref 32.0–36.0)
MCV: 73 fL — AB (ref 80–100)
Monocyte #: 1.6 x10 3/mm — ABNORMAL HIGH (ref 0.2–1.0)
Monocyte %: 10.2 %
Neutrophil #: 11.2 10*3/uL — ABNORMAL HIGH (ref 1.4–6.5)
Neutrophil %: 71 %
Platelet: 433 10*3/uL (ref 150–440)
RBC: 3.06 10*6/uL — ABNORMAL LOW (ref 4.40–5.90)
RDW: 15.9 % — AB (ref 11.5–14.5)
WBC: 15.7 10*3/uL — ABNORMAL HIGH (ref 3.8–10.6)

## 2014-04-16 LAB — SEDIMENTATION RATE: Erythrocyte Sed Rate: >140 mm/hr — ABNORMAL HIGH (ref 0–20)

## 2014-04-16 LAB — PHOSPHORUS: Phosphorus: 4.9 mg/dL (ref 2.5–4.9)

## 2014-04-17 LAB — CBC WITH DIFFERENTIAL/PLATELET
BASOS ABS: 0.1 10*3/uL (ref 0.0–0.1)
BASOS PCT: 0.8 %
EOS PCT: 5.9 %
Eosinophil #: 0.8 10*3/uL — ABNORMAL HIGH (ref 0.0–0.7)
HCT: 20.8 % — ABNORMAL LOW (ref 40.0–52.0)
HGB: 6.4 g/dL — ABNORMAL LOW (ref 13.0–18.0)
LYMPHS ABS: 2 10*3/uL (ref 1.0–3.6)
LYMPHS PCT: 14.7 %
MCH: 22.3 pg — AB (ref 26.0–34.0)
MCHC: 30.7 g/dL — ABNORMAL LOW (ref 32.0–36.0)
MCV: 73 fL — ABNORMAL LOW (ref 80–100)
MONO ABS: 1.4 x10 3/mm — AB (ref 0.2–1.0)
MONOS PCT: 10.4 %
NEUTROS PCT: 68.2 %
Neutrophil #: 9.4 10*3/uL — ABNORMAL HIGH (ref 1.4–6.5)
PLATELETS: 407 10*3/uL (ref 150–440)
RBC: 2.86 10*6/uL — AB (ref 4.40–5.90)
RDW: 16 % — ABNORMAL HIGH (ref 11.5–14.5)
WBC: 13.8 10*3/uL — ABNORMAL HIGH (ref 3.8–10.6)

## 2014-04-17 LAB — BASIC METABOLIC PANEL
ANION GAP: 12 (ref 7–16)
BUN: 41 mg/dL — AB (ref 7–18)
CALCIUM: 8.1 mg/dL — AB (ref 8.5–10.1)
CO2: 28 mmol/L (ref 21–32)
CREATININE: 4.01 mg/dL — AB (ref 0.60–1.30)
Chloride: 95 mmol/L — ABNORMAL LOW (ref 98–107)
GFR CALC AF AMER: 18 — AB
GFR CALC NON AF AMER: 16 — AB
GLUCOSE: 299 mg/dL — AB (ref 65–99)
Osmolality: 291 (ref 275–301)
Potassium: 3.2 mmol/L — ABNORMAL LOW (ref 3.5–5.1)
SODIUM: 135 mmol/L — AB (ref 136–145)

## 2014-04-18 LAB — CBC WITH DIFFERENTIAL/PLATELET
BASOS PCT: 0.5 %
Basophil #: 0.1 10*3/uL (ref 0.0–0.1)
Eosinophil #: 0.6 10*3/uL (ref 0.0–0.7)
Eosinophil %: 4.4 %
HCT: 25.7 % — ABNORMAL LOW (ref 40.0–52.0)
HGB: 8.2 g/dL — AB (ref 13.0–18.0)
LYMPHS ABS: 1.8 10*3/uL (ref 1.0–3.6)
Lymphocyte %: 12.6 %
MCH: 23.5 pg — AB (ref 26.0–34.0)
MCHC: 31.8 g/dL — ABNORMAL LOW (ref 32.0–36.0)
MCV: 74 fL — AB (ref 80–100)
MONOS PCT: 10.3 %
Monocyte #: 1.5 x10 3/mm — ABNORMAL HIGH (ref 0.2–1.0)
NEUTROS PCT: 72.2 %
Neutrophil #: 10.6 10*3/uL — ABNORMAL HIGH (ref 1.4–6.5)
PLATELETS: 484 10*3/uL — AB (ref 150–440)
RBC: 3.48 10*6/uL — ABNORMAL LOW (ref 4.40–5.90)
RDW: 17.5 % — ABNORMAL HIGH (ref 11.5–14.5)
WBC: 14.7 10*3/uL — ABNORMAL HIGH (ref 3.8–10.6)

## 2014-04-18 LAB — PHOSPHORUS: Phosphorus: 4.4 mg/dL (ref 2.5–4.9)

## 2014-04-19 ENCOUNTER — Ambulatory Visit: Payer: Self-pay

## 2014-04-20 LAB — CBC WITH DIFFERENTIAL/PLATELET
BASOS ABS: 0 10*3/uL (ref 0.0–0.1)
Basophil %: 0.3 %
EOS PCT: 4 %
Eosinophil #: 0.7 10*3/uL (ref 0.0–0.7)
HCT: 24.7 % — AB (ref 40.0–52.0)
HGB: 7.7 g/dL — ABNORMAL LOW (ref 13.0–18.0)
LYMPHS PCT: 9.5 %
Lymphocyte #: 1.6 10*3/uL (ref 1.0–3.6)
MCH: 23.1 pg — AB (ref 26.0–34.0)
MCHC: 31.3 g/dL — ABNORMAL LOW (ref 32.0–36.0)
MCV: 74 fL — AB (ref 80–100)
MONO ABS: 2 x10 3/mm — AB (ref 0.2–1.0)
Monocyte %: 11.8 %
Neutrophil #: 12.7 10*3/uL — ABNORMAL HIGH (ref 1.4–6.5)
Neutrophil %: 74.4 %
Platelet: 473 10*3/uL — ABNORMAL HIGH (ref 150–440)
RBC: 3.34 10*6/uL — ABNORMAL LOW (ref 4.40–5.90)
RDW: 18.3 % — AB (ref 11.5–14.5)
WBC: 17 10*3/uL — AB (ref 3.8–10.6)

## 2014-04-20 LAB — BASIC METABOLIC PANEL
ANION GAP: 11 (ref 7–16)
BUN: 47 mg/dL — AB (ref 7–18)
CALCIUM: 8.4 mg/dL — AB (ref 8.5–10.1)
CO2: 29 mmol/L (ref 21–32)
CREATININE: 4.35 mg/dL — AB (ref 0.60–1.30)
Chloride: 100 mmol/L (ref 98–107)
EGFR (African American): 16 — ABNORMAL LOW
EGFR (Non-African Amer.): 14 — ABNORMAL LOW
GLUCOSE: 207 mg/dL — AB (ref 65–99)
Osmolality: 298 (ref 275–301)
POTASSIUM: 3.4 mmol/L — AB (ref 3.5–5.1)
SODIUM: 140 mmol/L (ref 136–145)

## 2014-04-20 LAB — MISC AER/ANAEROBIC CULT.

## 2014-04-20 LAB — CULTURE, BLOOD (SINGLE)

## 2014-04-21 LAB — CBC WITH DIFFERENTIAL/PLATELET
Basophil #: 0.1 10*3/uL (ref 0.0–0.1)
Basophil %: 0.7 %
EOS ABS: 1.1 10*3/uL — AB (ref 0.0–0.7)
EOS PCT: 7.7 %
HCT: 24 % — AB (ref 40.0–52.0)
HGB: 7.6 g/dL — AB (ref 13.0–18.0)
LYMPHS ABS: 1.9 10*3/uL (ref 1.0–3.6)
LYMPHS PCT: 13.3 %
MCH: 23.3 pg — ABNORMAL LOW (ref 26.0–34.0)
MCHC: 31.5 g/dL — ABNORMAL LOW (ref 32.0–36.0)
MCV: 74 fL — AB (ref 80–100)
MONO ABS: 1.5 x10 3/mm — AB (ref 0.2–1.0)
Monocyte %: 10.7 %
NEUTROS PCT: 67.6 %
Neutrophil #: 9.6 10*3/uL — ABNORMAL HIGH (ref 1.4–6.5)
Platelet: 503 10*3/uL — ABNORMAL HIGH (ref 150–440)
RBC: 3.25 10*6/uL — ABNORMAL LOW (ref 4.40–5.90)
RDW: 18.7 % — ABNORMAL HIGH (ref 11.5–14.5)
WBC: 14.2 10*3/uL — AB (ref 3.8–10.6)

## 2014-04-21 LAB — BASIC METABOLIC PANEL
Anion Gap: 9 (ref 7–16)
BUN: 56 mg/dL — ABNORMAL HIGH (ref 7–18)
CHLORIDE: 97 mmol/L — AB (ref 98–107)
Calcium, Total: 8.2 mg/dL — ABNORMAL LOW (ref 8.5–10.1)
Co2: 29 mmol/L (ref 21–32)
Creatinine: 5.19 mg/dL — ABNORMAL HIGH (ref 0.60–1.30)
EGFR (African American): 13 — ABNORMAL LOW
EGFR (Non-African Amer.): 11 — ABNORMAL LOW
GLUCOSE: 207 mg/dL — AB (ref 65–99)
OSMOLALITY: 292 (ref 275–301)
Potassium: 3.6 mmol/L (ref 3.5–5.1)
Sodium: 135 mmol/L — ABNORMAL LOW (ref 136–145)

## 2014-04-22 LAB — CBC WITH DIFFERENTIAL/PLATELET
Basophil #: 0.1 10*3/uL (ref 0.0–0.1)
Basophil %: 0.7 %
Eosinophil #: 0.7 10*3/uL (ref 0.0–0.7)
Eosinophil %: 5.4 %
HCT: 23.6 % — AB (ref 40.0–52.0)
HGB: 7.3 g/dL — ABNORMAL LOW (ref 13.0–18.0)
LYMPHS PCT: 15.8 %
Lymphocyte #: 2 10*3/uL (ref 1.0–3.6)
MCH: 22.9 pg — AB (ref 26.0–34.0)
MCHC: 30.9 g/dL — ABNORMAL LOW (ref 32.0–36.0)
MCV: 74 fL — ABNORMAL LOW (ref 80–100)
Monocyte #: 1.5 x10 3/mm — ABNORMAL HIGH (ref 0.2–1.0)
Monocyte %: 12.3 %
NEUTROS PCT: 65.8 %
Neutrophil #: 8.3 10*3/uL — ABNORMAL HIGH (ref 1.4–6.5)
Platelet: 434 10*3/uL (ref 150–440)
RBC: 3.18 10*6/uL — AB (ref 4.40–5.90)
RDW: 18.3 % — AB (ref 11.5–14.5)
WBC: 12.6 10*3/uL — ABNORMAL HIGH (ref 3.8–10.6)

## 2014-04-23 LAB — CBC WITH DIFFERENTIAL/PLATELET
BASOS ABS: 0.1 10*3/uL (ref 0.0–0.1)
BASOS PCT: 0.6 %
Basophil #: 0.2 10*3/uL — ABNORMAL HIGH (ref 0.0–0.1)
Basophil %: 1 %
Eosinophil #: 0.8 10*3/uL — ABNORMAL HIGH (ref 0.0–0.7)
Eosinophil #: 0.5 10*3/uL (ref 0.0–0.7)
Eosinophil %: 5.2 %
Eosinophil %: 3.3 %
HCT: 25.1 % — AB (ref 40.0–52.0)
HCT: 25.6 % — ABNORMAL LOW (ref 40.0–52.0)
HGB: 8 g/dL — AB (ref 13.0–18.0)
HGB: 8.1 g/dL — ABNORMAL LOW (ref 13.0–18.0)
Lymphocyte #: 2.6 10*3/uL (ref 1.0–3.6)
Lymphocyte #: 3 10*3/uL (ref 1.0–3.6)
Lymphocyte %: 15.8 %
Lymphocyte %: 19.8 %
MCH: 23.4 pg — AB (ref 26.0–34.0)
MCH: 23.5 pg — ABNORMAL LOW (ref 26.0–34.0)
MCHC: 31.8 g/dL — AB (ref 32.0–36.0)
MCHC: 31.9 g/dL — ABNORMAL LOW (ref 32.0–36.0)
MCV: 74 fL — AB (ref 80–100)
MCV: 74 fL — ABNORMAL LOW (ref 80–100)
Monocyte #: 1.9 x10 3/mm — ABNORMAL HIGH (ref 0.2–1.0)
Monocyte #: 0.9 x10 3/mm (ref 0.2–1.0)
Monocyte %: 11.5 %
Monocyte %: 6.2 %
NEUTROS PCT: 66.9 %
Neutrophil #: 10.9 10*3/uL — ABNORMAL HIGH (ref 1.4–6.5)
Neutrophil #: 10.5 10*3/uL — ABNORMAL HIGH (ref 1.4–6.5)
Neutrophil %: 69.7 %
PLATELETS: 476 10*3/uL — AB (ref 150–440)
Platelet: 423 10*3/uL (ref 150–440)
RBC: 3.4 10*6/uL — AB (ref 4.40–5.90)
RBC: 3.46 10*6/uL — ABNORMAL LOW (ref 4.40–5.90)
RDW: 18.8 % — ABNORMAL HIGH (ref 11.5–14.5)
RDW: 18.9 % — ABNORMAL HIGH (ref 11.5–14.5)
WBC: 16.2 10*3/uL — ABNORMAL HIGH (ref 3.8–10.6)
WBC: 15.1 10*3/uL — ABNORMAL HIGH (ref 3.8–10.6)

## 2014-04-23 LAB — RENAL FUNCTION PANEL
Albumin: 1.7 g/dL — ABNORMAL LOW (ref 3.4–5.0)
Anion Gap: 9 (ref 7–16)
BUN: 54 mg/dL — AB (ref 7–18)
CALCIUM: 7.7 mg/dL — AB (ref 8.5–10.1)
CHLORIDE: 96 mmol/L — AB (ref 98–107)
Co2: 28 mmol/L (ref 21–32)
Creatinine: 4.85 mg/dL — ABNORMAL HIGH (ref 0.60–1.30)
EGFR (African American): 14 — ABNORMAL LOW
EGFR (Non-African Amer.): 12 — ABNORMAL LOW
Glucose: 289 mg/dL — ABNORMAL HIGH (ref 65–99)
Osmolality: 292 (ref 275–301)
Phosphorus: 4.9 mg/dL (ref 2.5–4.9)
Potassium: 3.4 mmol/L — ABNORMAL LOW (ref 3.5–5.1)
SODIUM: 133 mmol/L — AB (ref 136–145)

## 2014-04-23 LAB — EXPECTORATED SPUTUM ASSESSMENT W REFEX TO RESP CULTURE

## 2014-04-23 LAB — MAGNESIUM: MAGNESIUM: 2.4 mg/dL

## 2014-04-24 LAB — CBC WITH DIFFERENTIAL/PLATELET
BASOS ABS: 0.2 10*3/uL — AB (ref 0.0–0.1)
Basophil %: 0.9 %
EOS PCT: 4.1 %
Eosinophil #: 0.8 10*3/uL — ABNORMAL HIGH (ref 0.0–0.7)
HCT: 25 % — ABNORMAL LOW (ref 40.0–52.0)
HGB: 7.7 g/dL — AB (ref 13.0–18.0)
Lymphocyte #: 3 10*3/uL (ref 1.0–3.6)
Lymphocyte %: 16.4 %
MCH: 22.7 pg — ABNORMAL LOW (ref 26.0–34.0)
MCHC: 30.9 g/dL — ABNORMAL LOW (ref 32.0–36.0)
MCV: 74 fL — ABNORMAL LOW (ref 80–100)
Monocyte #: 2.2 x10 3/mm — ABNORMAL HIGH (ref 0.2–1.0)
Monocyte %: 11.7 %
NEUTROS PCT: 66.9 %
Neutrophil #: 12.4 10*3/uL — ABNORMAL HIGH (ref 1.4–6.5)
PLATELETS: 465 10*3/uL — AB (ref 150–440)
RBC: 3.4 10*6/uL — ABNORMAL LOW (ref 4.40–5.90)
RDW: 18.6 % — ABNORMAL HIGH (ref 11.5–14.5)
WBC: 18.5 10*3/uL — ABNORMAL HIGH (ref 3.8–10.6)

## 2014-04-24 LAB — CLOSTRIDIUM DIFFICILE(ARMC)

## 2014-04-24 LAB — CULTURE, BLOOD (SINGLE)

## 2014-04-25 LAB — CBC WITH DIFFERENTIAL/PLATELET
BASOS PCT: 0.8 %
Basophil #: 0.2 10*3/uL — ABNORMAL HIGH (ref 0.0–0.1)
EOS ABS: 0.7 10*3/uL (ref 0.0–0.7)
Eosinophil %: 3.5 %
HCT: 22.3 % — AB (ref 40.0–52.0)
HGB: 7.1 g/dL — AB (ref 13.0–18.0)
LYMPHS ABS: 2.9 10*3/uL (ref 1.0–3.6)
Lymphocyte %: 14.8 %
MCH: 23.3 pg — ABNORMAL LOW (ref 26.0–34.0)
MCHC: 32 g/dL (ref 32.0–36.0)
MCV: 73 fL — ABNORMAL LOW (ref 80–100)
Monocyte #: 1.8 x10 3/mm — ABNORMAL HIGH (ref 0.2–1.0)
Monocyte %: 9.1 %
NEUTROS ABS: 13.9 10*3/uL — AB (ref 1.4–6.5)
NEUTROS PCT: 71.8 %
Platelet: 438 10*3/uL (ref 150–440)
RBC: 3.06 10*6/uL — ABNORMAL LOW (ref 4.40–5.90)
RDW: 19.1 % — AB (ref 11.5–14.5)
WBC: 19.4 10*3/uL — ABNORMAL HIGH (ref 3.8–10.6)

## 2014-04-25 LAB — RENAL FUNCTION PANEL
ALBUMIN: 1.6 g/dL — AB (ref 3.4–5.0)
ANION GAP: 12 (ref 7–16)
BUN: 78 mg/dL — ABNORMAL HIGH (ref 7–18)
CREATININE: 4.79 mg/dL — AB (ref 0.60–1.30)
Calcium, Total: 7.7 mg/dL — ABNORMAL LOW (ref 8.5–10.1)
Chloride: 97 mmol/L — ABNORMAL LOW (ref 98–107)
Co2: 28 mmol/L (ref 21–32)
EGFR (African American): 15 — ABNORMAL LOW
EGFR (Non-African Amer.): 13 — ABNORMAL LOW
GLUCOSE: 176 mg/dL — AB (ref 65–99)
OSMOLALITY: 301 (ref 275–301)
PHOSPHORUS: 6.3 mg/dL — AB (ref 2.5–4.9)
POTASSIUM: 3.5 mmol/L (ref 3.5–5.1)
SODIUM: 137 mmol/L (ref 136–145)

## 2014-04-26 LAB — CBC WITH DIFFERENTIAL/PLATELET
BASOS PCT: 1 %
Basophil #: 0.2 10*3/uL — ABNORMAL HIGH (ref 0.0–0.1)
Eosinophil #: 0.6 10*3/uL (ref 0.0–0.7)
Eosinophil %: 4.3 %
HCT: 21.6 % — AB (ref 40.0–52.0)
HGB: 6.9 g/dL — AB (ref 13.0–18.0)
Lymphocyte #: 3.2 10*3/uL (ref 1.0–3.6)
Lymphocyte %: 21.2 %
MCH: 23.3 pg — ABNORMAL LOW (ref 26.0–34.0)
MCHC: 31.8 g/dL — ABNORMAL LOW (ref 32.0–36.0)
MCV: 73 fL — AB (ref 80–100)
MONOS PCT: 10.5 %
Monocyte #: 1.6 x10 3/mm — ABNORMAL HIGH (ref 0.2–1.0)
NEUTROS ABS: 9.5 10*3/uL — AB (ref 1.4–6.5)
Neutrophil %: 63 %
Platelet: 357 10*3/uL (ref 150–440)
RBC: 2.95 10*6/uL — AB (ref 4.40–5.90)
RDW: 19.4 % — AB (ref 11.5–14.5)
WBC: 15 10*3/uL — ABNORMAL HIGH (ref 3.8–10.6)

## 2014-04-26 LAB — AEROBIC CULTURE

## 2014-04-27 LAB — CBC WITH DIFFERENTIAL/PLATELET
BASOS ABS: 0.2 10*3/uL — AB (ref 0.0–0.1)
Basophil %: 1.3 %
EOS ABS: 1 10*3/uL — AB (ref 0.0–0.7)
Eosinophil %: 5.8 %
HCT: 27.3 % — ABNORMAL LOW (ref 40.0–52.0)
HGB: 8.8 g/dL — AB (ref 13.0–18.0)
LYMPHS ABS: 3.8 10*3/uL — AB (ref 1.0–3.6)
Lymphocyte %: 22.1 %
MCH: 24.7 pg — AB (ref 26.0–34.0)
MCHC: 32.5 g/dL (ref 32.0–36.0)
MCV: 76 fL — AB (ref 80–100)
Monocyte #: 1.8 x10 3/mm — ABNORMAL HIGH (ref 0.2–1.0)
Monocyte %: 10.6 %
NEUTROS ABS: 10.4 10*3/uL — AB (ref 1.4–6.5)
Neutrophil %: 60.2 %
PLATELETS: 423 10*3/uL (ref 150–440)
RBC: 3.59 10*6/uL — ABNORMAL LOW (ref 4.40–5.90)
RDW: 22.6 % — ABNORMAL HIGH (ref 11.5–14.5)
WBC: 17.3 10*3/uL — ABNORMAL HIGH (ref 3.8–10.6)

## 2014-04-28 LAB — RENAL FUNCTION PANEL
ANION GAP: 15 (ref 7–16)
Albumin: 1.5 g/dL — ABNORMAL LOW (ref 3.4–5.0)
BUN: 102 mg/dL — ABNORMAL HIGH (ref 7–18)
CALCIUM: 7.1 mg/dL — AB (ref 8.5–10.1)
CHLORIDE: 96 mmol/L — AB (ref 98–107)
CO2: 24 mmol/L (ref 21–32)
CREATININE: 5.03 mg/dL — AB (ref 0.60–1.30)
GFR CALC AF AMER: 14 — AB
GFR CALC NON AF AMER: 12 — AB
Glucose: 211 mg/dL — ABNORMAL HIGH (ref 65–99)
OSMOLALITY: 308 (ref 275–301)
PHOSPHORUS: 8.4 mg/dL — AB (ref 2.5–4.9)
POTASSIUM: 4.2 mmol/L (ref 3.5–5.1)
Sodium: 135 mmol/L — ABNORMAL LOW (ref 136–145)

## 2014-04-28 LAB — CBC WITH DIFFERENTIAL/PLATELET
BASOS ABS: 0.2 10*3/uL — AB (ref 0.0–0.1)
BASOS ABS: 0.2 10*3/uL — AB (ref 0.0–0.1)
BASOS PCT: 1.5 %
Basophil %: 1.4 %
EOS PCT: 5.4 %
Eosinophil #: 0.9 10*3/uL — ABNORMAL HIGH (ref 0.0–0.7)
Eosinophil #: 0.8 10*3/uL — ABNORMAL HIGH (ref 0.0–0.7)
Eosinophil %: 5.7 %
HCT: 28.5 % — ABNORMAL LOW (ref 40.0–52.0)
HCT: 36.3 % — ABNORMAL LOW (ref 40.0–52.0)
HGB: 9 g/dL — ABNORMAL LOW (ref 13.0–18.0)
HGB: 11.7 g/dL — AB (ref 13.0–18.0)
LYMPHS PCT: 17.4 %
Lymphocyte #: 3.5 10*3/uL (ref 1.0–3.6)
Lymphocyte #: 2.5 10*3/uL (ref 1.0–3.6)
Lymphocyte %: 20.9 %
MCH: 24.3 pg — AB (ref 26.0–34.0)
MCH: 24.6 pg — ABNORMAL LOW (ref 26.0–34.0)
MCHC: 31.5 g/dL — AB (ref 32.0–36.0)
MCHC: 32.2 g/dL (ref 32.0–36.0)
MCV: 77 fL — ABNORMAL LOW (ref 80–100)
MCV: 76 fL — ABNORMAL LOW (ref 80–100)
MONO ABS: 1.3 x10 3/mm — AB (ref 0.2–1.0)
MONOS PCT: 8.5 %
Monocyte #: 1.2 x10 3/mm — ABNORMAL HIGH (ref 0.2–1.0)
Monocyte %: 8 %
Neutrophil #: 10.8 10*3/uL — ABNORMAL HIGH (ref 1.4–6.5)
Neutrophil #: 9.5 10*3/uL — ABNORMAL HIGH (ref 1.4–6.5)
Neutrophil %: 64.2 %
Neutrophil %: 67 %
PLATELETS: 436 10*3/uL (ref 150–440)
Platelet: 466 10*3/uL — ABNORMAL HIGH (ref 150–440)
RBC: 3.71 10*6/uL — ABNORMAL LOW (ref 4.40–5.90)
RBC: 4.75 10*6/uL (ref 4.40–5.90)
RDW: 23 % — AB (ref 11.5–14.5)
RDW: 24 % — ABNORMAL HIGH (ref 11.5–14.5)
WBC: 16.8 10*3/uL — ABNORMAL HIGH (ref 3.8–10.6)
WBC: 14.1 10*3/uL — AB (ref 3.8–10.6)

## 2014-04-30 LAB — CBC WITH DIFFERENTIAL/PLATELET
Basophil #: 0.2 10*3/uL — ABNORMAL HIGH (ref 0.0–0.1)
Basophil %: 1.6 %
Eosinophil #: 1 10*3/uL — ABNORMAL HIGH (ref 0.0–0.7)
Eosinophil %: 7 %
HCT: 30.5 % — AB (ref 40.0–52.0)
HGB: 9.5 g/dL — ABNORMAL LOW (ref 13.0–18.0)
LYMPHS PCT: 28 %
Lymphocyte #: 4.2 10*3/uL — ABNORMAL HIGH (ref 1.0–3.6)
MCH: 23.9 pg — AB (ref 26.0–34.0)
MCHC: 31 g/dL — AB (ref 32.0–36.0)
MCV: 77 fL — ABNORMAL LOW (ref 80–100)
MONOS PCT: 8 %
Monocyte #: 1.2 x10 3/mm — ABNORMAL HIGH (ref 0.2–1.0)
NEUTROS ABS: 8.3 10*3/uL — AB (ref 1.4–6.5)
Neutrophil %: 55.4 %
PLATELETS: 445 10*3/uL — AB (ref 150–440)
RBC: 3.95 10*6/uL — ABNORMAL LOW (ref 4.40–5.90)
RDW: 23.3 % — ABNORMAL HIGH (ref 11.5–14.5)
WBC: 15 10*3/uL — ABNORMAL HIGH (ref 3.8–10.6)

## 2014-04-30 LAB — RENAL FUNCTION PANEL
Albumin: 1.6 g/dL — ABNORMAL LOW (ref 3.4–5.0)
Anion Gap: 9 (ref 7–16)
BUN: 115 mg/dL — ABNORMAL HIGH (ref 7–18)
Calcium, Total: 7.1 mg/dL — ABNORMAL LOW (ref 8.5–10.1)
Chloride: 97 mmol/L — ABNORMAL LOW (ref 98–107)
Co2: 27 mmol/L (ref 21–32)
Creatinine: 4.71 mg/dL — ABNORMAL HIGH (ref 0.60–1.30)
EGFR (African American): 15 — ABNORMAL LOW
EGFR (Non-African Amer.): 13 — ABNORMAL LOW
Glucose: 153 mg/dL — ABNORMAL HIGH (ref 65–99)
Osmolality: 306 (ref 275–301)
Phosphorus: 8.6 mg/dL — ABNORMAL HIGH (ref 2.5–4.9)
Potassium: 4.4 mmol/L (ref 3.5–5.1)
Sodium: 133 mmol/L — ABNORMAL LOW (ref 136–145)

## 2014-05-02 LAB — RENAL FUNCTION PANEL
ANION GAP: 12 (ref 7–16)
Albumin: 1.5 g/dL — ABNORMAL LOW (ref 3.4–5.0)
BUN: 87 mg/dL — ABNORMAL HIGH (ref 7–18)
Calcium, Total: 7.3 mg/dL — ABNORMAL LOW (ref 8.5–10.1)
Chloride: 97 mmol/L — ABNORMAL LOW (ref 98–107)
Co2: 30 mmol/L (ref 21–32)
Creatinine: 4.27 mg/dL — ABNORMAL HIGH (ref 0.60–1.30)
EGFR (Non-African Amer.): 14 — ABNORMAL LOW
GFR CALC AF AMER: 17 — AB
Glucose: 154 mg/dL — ABNORMAL HIGH (ref 65–99)
Osmolality: 307 (ref 275–301)
PHOSPHORUS: 8.6 mg/dL — AB (ref 2.5–4.9)
POTASSIUM: 4.4 mmol/L (ref 3.5–5.1)
SODIUM: 139 mmol/L (ref 136–145)

## 2014-05-02 LAB — CBC WITH DIFFERENTIAL/PLATELET
BASOS PCT: 1 %
Basophil #: 0.1 10*3/uL (ref 0.0–0.1)
Eosinophil #: 0.5 10*3/uL (ref 0.0–0.7)
Eosinophil %: 3.9 %
HCT: 27.2 % — AB (ref 40.0–52.0)
HGB: 8.5 g/dL — ABNORMAL LOW (ref 13.0–18.0)
LYMPHS ABS: 3.3 10*3/uL (ref 1.0–3.6)
LYMPHS PCT: 23.8 %
MCH: 24 pg — AB (ref 26.0–34.0)
MCHC: 31.1 g/dL — ABNORMAL LOW (ref 32.0–36.0)
MCV: 77 fL — AB (ref 80–100)
MONOS PCT: 11.6 %
Monocyte #: 1.6 x10 3/mm — ABNORMAL HIGH (ref 0.2–1.0)
Neutrophil #: 8.2 10*3/uL — ABNORMAL HIGH (ref 1.4–6.5)
Neutrophil %: 59.7 %
Platelet: 408 10*3/uL (ref 150–440)
RBC: 3.53 10*6/uL — ABNORMAL LOW (ref 4.40–5.90)
RDW: 23.8 % — ABNORMAL HIGH (ref 11.5–14.5)
WBC: 13.7 10*3/uL — AB (ref 3.8–10.6)

## 2014-05-03 LAB — CBC WITH DIFFERENTIAL/PLATELET
BASOS ABS: 0.2 10*3/uL — AB (ref 0.0–0.1)
Basophil %: 1.9 %
EOS ABS: 0.6 10*3/uL (ref 0.0–0.7)
Eosinophil %: 5.1 %
HCT: 25.4 % — ABNORMAL LOW (ref 40.0–52.0)
HGB: 8.1 g/dL — ABNORMAL LOW (ref 13.0–18.0)
LYMPHS ABS: 2.8 10*3/uL (ref 1.0–3.6)
Lymphocyte %: 23.8 %
MCH: 24.3 pg — ABNORMAL LOW (ref 26.0–34.0)
MCHC: 31.9 g/dL — ABNORMAL LOW (ref 32.0–36.0)
MCV: 76 fL — ABNORMAL LOW (ref 80–100)
MONOS PCT: 12.8 %
Monocyte #: 1.5 x10 3/mm — ABNORMAL HIGH (ref 0.2–1.0)
NEUTROS ABS: 6.7 10*3/uL — AB (ref 1.4–6.5)
Neutrophil %: 56.4 %
Platelet: 353 10*3/uL (ref 150–440)
RBC: 3.34 10*6/uL — ABNORMAL LOW (ref 4.40–5.90)
RDW: 23.9 % — AB (ref 11.5–14.5)
WBC: 11.9 10*3/uL — AB (ref 3.8–10.6)

## 2014-05-05 LAB — RENAL FUNCTION PANEL
Albumin: 1.7 g/dL — ABNORMAL LOW (ref 3.4–5.0)
Anion Gap: 10 (ref 7–16)
BUN: 103 mg/dL — ABNORMAL HIGH (ref 7–18)
Calcium, Total: 6.2 mg/dL — CL (ref 8.5–10.1)
Chloride: 95 mmol/L — ABNORMAL LOW (ref 98–107)
Co2: 27 mmol/L (ref 21–32)
Creatinine: 4.98 mg/dL — ABNORMAL HIGH (ref 0.60–1.30)
GFR CALC AF AMER: 14 — AB
GFR CALC NON AF AMER: 12 — AB
Glucose: 205 mg/dL — ABNORMAL HIGH (ref 65–99)
Osmolality: 303 (ref 275–301)
PHOSPHORUS: 9 mg/dL — AB (ref 2.5–4.9)
POTASSIUM: 5 mmol/L (ref 3.5–5.1)
SODIUM: 132 mmol/L — AB (ref 136–145)

## 2014-05-05 LAB — CBC WITH DIFFERENTIAL/PLATELET
BASOS PCT: 1.4 %
Basophil #: 0.2 10*3/uL — ABNORMAL HIGH (ref 0.0–0.1)
EOS ABS: 0.4 10*3/uL (ref 0.0–0.7)
Eosinophil %: 3 %
HCT: 26.6 % — ABNORMAL LOW (ref 40.0–52.0)
HGB: 8.1 g/dL — ABNORMAL LOW (ref 13.0–18.0)
LYMPHS ABS: 3.6 10*3/uL (ref 1.0–3.6)
Lymphocyte %: 26.5 %
MCH: 23.2 pg — ABNORMAL LOW (ref 26.0–34.0)
MCHC: 30.5 g/dL — AB (ref 32.0–36.0)
MCV: 76 fL — ABNORMAL LOW (ref 80–100)
Monocyte #: 1.6 x10 3/mm — ABNORMAL HIGH (ref 0.2–1.0)
Monocyte %: 11.9 %
NEUTROS PCT: 57.2 %
Neutrophil #: 7.9 10*3/uL — ABNORMAL HIGH (ref 1.4–6.5)
PLATELETS: 372 10*3/uL (ref 150–440)
RBC: 3.49 10*6/uL — ABNORMAL LOW (ref 4.40–5.90)
RDW: 23.3 % — AB (ref 11.5–14.5)
WBC: 13.7 10*3/uL — ABNORMAL HIGH (ref 3.8–10.6)

## 2014-05-07 LAB — PHOSPHORUS: Phosphorus: 7.1 mg/dL — ABNORMAL HIGH (ref 2.5–4.9)

## 2014-05-08 LAB — EXPECTORATED SPUTUM ASSESSMENT W REFEX TO RESP CULTURE

## 2014-05-14 ENCOUNTER — Ambulatory Visit: Payer: Self-pay | Admitting: Internal Medicine

## 2014-05-14 ENCOUNTER — Ambulatory Visit: Payer: Self-pay | Admitting: Hospice and Palliative Medicine

## 2014-06-12 LAB — EXPECTORATED SPUTUM ASSESSMENT W REFEX TO RESP CULTURE

## 2014-06-14 DEATH — deceased

## 2014-12-01 NOTE — Discharge Summary (Signed)
PATIENT NAME:  Robert [name], Robert [name] MR#:  [id number] DATE OF BIRTH:  [birth year]  DATE OF ADMISSION:  05/01/2012 DATE OF DISCHARGE:  05/03/2012  HISTORY AND PHYSICAL: For a detailed note, please take a look at the History and Physical done by Dr. Imogene Burn on admission.   DISCHARGE DIAGNOSES:  1. Acute diabetic ketoacidosis secondary to medical noncompliance.  2. Acute on chronic renal failure, now resolved. 3. Hypertension. 4. Diabetic neuropathy. 5. Peripheral vascular disease.  6. Gastroesophageal reflux disease.  7. Urinary tract infection secondary to Escherichia coli.   DIET: The patient is being discharged on a low-sodium, low-fat, American Diabetic Association diet.   ACTIVITY: As tolerated.   FOLLOWUP:  1. Follow up with Dr. Hillery Aldo in the next 1 to 2 weeks.  2. The patient is being discharged with Home Health Nursing services.  DISCHARGE MEDICATIONS:   1. Gabapentin 600 mg daily.  2. Lantus 40 units in the evening.  3. Lisinopril 5 mg daily.  4. Omeprazole 20 mg daily. 5. Tramadol 50 mg every 6 hours as needed.  6. Ceftin 5 mg b.i.d. x7 days. 7. Metoprolol tartrate 25 mg b.i.d.   PERTINENT LABORATORY, DIAGNOSTIC AND RADIOLOGICAL DATA: CT scan of the abdomen and pelvis done without contrast on admission showed gastric distention. No evidence of obstructive mass. Small hiatal hernia. Chronic degenerative disease of the spine. The patient also had a chest x-ray done which showed no acute cardiopulmonary disease. Urine culture was positive for Escherichia coli.   HOSPITAL COURSE: The patient is a 59 year old male with medical problems as mentioned above, presented to the hospital secondary to nausea, vomiting, abdominal pain and noted to be in acute diabetic ketoacidosis.   1. Acute diabetic ketoacidosis: This was likely secondary to medical noncompliance. The patient's hemoglobin A1c was noted to be as high as 9.7. He was admitted to the Intensive Care Unit, started on an insulin  drip, aggressive IV fluids, and serial metabolic profiles were obtained. He was eventually taken off the insulin drip as his anion gap closed. He was started on his Lantus and sliding scale insulin coverage. His diet has slowly been advanced since then, and he has had no evidence of any further abdominal pain, nausea, vomiting. He was strongly advised to resume his Lantus as stated and follow a good diet.  2. Acute renal failure: This is acute on chronic renal failure. The patient has a baseline creatinine around 1.4 to 1.5. He presented with a creatinine of 2.8, with a BUN of 70, and this was likely secondary to DKA and dehydration. The patient was aggressively hydrated with IV fluids, and since then his BUN and creatinine and renal function have significantly improved, and now he is back to baseline.  3. Urinary tract infection: The patient was noted to have a urinary tract infection secondary to Escherichia coli. He was treated with IV ceftriaxone and is currently being discharged on p.o. Ceftin for the next few days.  4. Diabetic neuropathy: The patient was maintained on Neurontin. He will resume that.  5. Hypertension: The patient's lisinopril was held due to his acute renal failure, but it did not seem to control his blood pressure; therefore, metoprolol was added to his regimen. He is currently being discharged on lisinopril 5 mg daily and Metoprolol 25 mg b.i.d.  6. Gastroesophageal reflux disease: The patient was maintained on omeprazole. He will resume that upon discharge, too.   CODE STATUS:  The patient is a FULL CODE.    TIME SPENT:  40 minutes.   ____________________________ Rolly PancakeVivek J. Cherlynn KaiserSainani, MD vjs:cbb D: 05/03/2012 16:18:59 ET T: 05/06/2012 12:03:42 ET JOB#: [id number] cc: Maralyn SagoSarah "Maryruth HancockSallie" Patel, MD Houston SirenVIVEK J SAINANI MD ELECTRONICALLY SIGNED 05/10/2012 12:16

## 2014-12-01 NOTE — H&P (Signed)
PATIENT NAME:  Robert [name], [name] MR#:  [id number] DATE OF BIRTH:  April 02, 1956  DATE OF ADMISSION:  05/01/2012  PRIMARY CARE PHYSICIAN:  Dr. Hillery AldoSarah Patel REFERRING PHYSICIAN:  Dr. Cyril LoosenKinner  CHIEF COMPLAINT: Nausea and vomiting for three days.   HISTORY OF PRESENT ILLNESS: The patient is a 59 year old African American male with a history of hypertension, diabetes, gastroesophageal reflux disease, history of cocaine, alcohol, and tobacco abuse presented to the ED with nausea and vomiting for the past three days. The patient also complains of headache, dizziness, and generalized weakness. He has not eaten anything for the past three days.  He had nausea and vomited multiple times, but denies any abdominal pain, melena, or bloody stool. He also complains of polyuria and polydipsia, but no dysuria or hematuria. No fever or chills. The patient was noted to have a high blood sugar above  600 and anion gap 28. The patient was treated with Rocephin due to urinary tract infection and started on insulin drip and IV fluids. According to the patient he is taking Lantus 40 units every day. Blood sugar is about 300 at home. He has not seen his primary care physician for about several months.   PAST MEDICAL HISTORY:  1. Hypertension.  2. Diabetes. 3. Gastroesophageal reflux disease. 4. Remote history of alcohol, cocaine, and tobacco abuse. 5. History of noncompliance.   PAST SURGICAL HISTORY: Left below-knee amputation.   ALLERGIES: No known drug allergies.  HOME MEDICATIONS:  1. Gabapentin  600 mg p.o. daily.  2. Lantus 40 units subcutaneous at bedtime.  3. Lisinopril 5 mg p.o. daily.  4. Omeprazole 20 mg p.o. daily.  5. Tramadol 50 mg p.o. q. 6 h. p.r.n.   PERSONAL HISTORY: The patient smokes 1 pack and a day and has smoked for more than 30 years. He denies any alcohol drinking or illicit drugs.   FAMILY HISTORY: Diabetes.   REVIEW OF SYSTEMS:  CONSTITUTIONAL: The patient denies any fever or chills but  has a headache, dizziness, and generalized weakness. EYES: No double vision or blurred vision. ENT: No epistaxis, postnasal drip, or slurred speech. CARDIOVASCULAR: No chest pain, palpitations, orthopnea, or nocturnal dyspnea. No leg edema. PULMONARY: No cough, sputum, shortness of breath, or hemoptysis. GASTROINTESTINAL: No abdominal pain, but has nausea and vomiting. No melena or bloody stools. GENITOURINARY: No dysuria, hematuria, or incontinence. SKIN: No rash or jaundice. MUSCULOSKELETAL: No joint pain. No edema. ENDOCRINE: Positive for polyuria, polydipsia, but no heat or cold intolerance. NEURO: No syncope, loss of consciousness, or seizure.   PHYSICAL EXAMINATION:  VITAL SIGNS: Temperature 97.4, blood pressure 139/72, pulse 95, respirations 22, oxygen saturation 99% on room air.   GENERAL: The patient is alert, awake, oriented, in no acute distress.   HEENT: Pupils round, equal, reactive to light and accommodation.  Dry oral mucosa. Clear oropharynx.   NECK: Supple. No JVD or carotid bruit. No lymphadenopathy. No thyromegaly.   CARDIOVASCULAR: S1 and S2, regular rate and rhythm. No murmurs or gallops.   PULMONARY: Bilateral air entry. No wheezing or rales. No use of accessory muscles to breathe.   ABDOMEN: Soft. Bowel sounds present. No distention but has diffuse tenderness. No organomegaly.    EXTREMITIES: No edema, clubbing, or cyanosis. No calf tenderness. Left side below-knee amputation. Strong pedal pulses on the right side.   SKIN: No rash or jaundice.   NEUROLOGIC: Alert and oriented times three. No focal deficit but looks lethargic.    LABORATORY, DIAGNOSTIC, AND RADIOLOGICAL DATA: Blood sugar more than  600, glucose 753, BUN 70, creatinine 288, sodium 123, potassium 4.4, chloride 80, bicarbonate 15, anion gap 28. WBC  22, hemoglobin 15.5, platelets 161. Urinalysis shows WBCs 80, RBCs 11. ABG shows pH 7.13, pO2 37.  Troponin less than 0.02.   CT scan of the abdomen and pelvis  showed gastric distention. No obstruction. Chest x-ray: No acute cardiopulmonary changes. EKG shows normal sinus rhythm at 81 beats per minute.   IMPRESSION:  1. Diabetic ketoacidosis. 2. Acute renal failure and dehydration.  3. Urinary tract infection.  4. Leukocytosis.  5. Diabetes.  6. Hypertension.  7. Tobacco abuse.   PLAN OF TREATMENT:  1. The patient will be admitted to the Critical Care Unit.  2. We will keep n.p.o. except medications.  3. We will continue insulin drip and give normal saline with potassium 200 mL per hour.  4. We will hold lisinopril, gabapentin, and follow up basic metabolic panel. 5. For urinary tract infection we will continue Rocephin, follow CBC, blood culture, and urine culture.  6. GI and deep vein thrombosis prophylaxis.  7. Smoking cessation was counseled for six minutes.   I discussed the patient's critical situation and plan of treatment with the patient.         TIME SPENT: About 62 minutes.    ____________________________ [name], MD qc:bjt D: 05/01/2012 14:06:46 ET T: 05/01/2012 15:06:20 ET JOB#: [id number]  cc: [name], MD, <Dictator> [name], MD [name] MD ELECTRONICALLY SIGNED 05/02/2012 15:06

## 2014-12-04 NOTE — Consult Note (Signed)
   Comments   Chest CT shows severe B.pneumonia. CM suggested that pt may do well in an LTACH. Agree. Doubt recurrent hospitalizations can be avoided if pt returns to SNF.   Electronic Signatures: Tyrann Donaho, Harriett SineNancy (MD)  (Signed [phone]-Nov-14 16:06)  Authored: Palliative Care   Last Updated: 10-Nov-14 16:06 by Maigen Mozingo, Harriett SineNancy (MD)

## 2014-12-04 NOTE — Op Note (Signed)
PATIENT NAME:  Robert [name], Mart J MR#:  [id number] DATE OF BIRTH:  [birth year]  DATE OF PROCEDURE:  06/05/2013  PREOPERATIVE DIAGNOSES:  1. Hyperkalemia.  2. Acute renal failure.  3. Confusion with combative behavior.   POSTOPERATIVE DIAGNOSES:  1. Hyperkalemia.  2. Acute renal failure.  3. Confusion with combative behavior.   PROCEDURE PERFORMED: Insertion of left femoral triple-lumen dialysis catheter, temporary type, with ultrasound guidance.   SURGEON: Renford DillsGregory G. Jiali Linney, M.D.   DESCRIPTION OF PROCEDURE: The patient is in his hospital room. He is slanted with one leg outside the bed and the sheets all disheveled when I enter the room, with the IVAC beeping. He is mumbling but does not seem to answer questions appropriately.   The patient was repositioned in bed. He is positioned fairly supine. Ultrasound is used to verify patency of the femoral vein in the left groin, and subsequently his groin is cleaned and then prepped and draped in a sterile fashion. Ultrasound is placed in a sterile sleeve. Lidocaine 1% is infiltrated into the soft tissues. Ultrasound is used to verify the femoral vein. Using sterile technique, the femoral vein is echolucent and compressible, indicating patency. Image was recorded, and under direct visualization, microneedle is inserted into the femoral vein, microwire followed by microsheath, J-wire followed by counterincision with an 11 blade and then the dilators. Trialysis catheter is then advanced. It is a 30 cm Trialysis catheter. All 3 lumens aspirate and flush easily. It is secured to the skin of the thigh with 2-0 nylons, and a sterile dressing is applied. The patient tolerated the procedure well. There were no immediate complications.   ____________________________ Renford DillsGregory G. Sherre Wooton, MD ggs:gb D: 06/05/2013 20:22:23 ET T: 06/05/2013 21:42:45 ET JOB#: [id number]  cc: Renford DillsGregory G. Aleja Yearwood, MD, <Dictator> Renford DillsGREGORY G Iver Fehrenbach MD ELECTRONICALLY SIGNED 06/20/2013  8:15

## 2014-12-04 NOTE — Discharge Summary (Signed)
PATIENT NAME:  Robert [name], Robert [name] MR#:  [id number] DATE OF BIRTH:  1955-09-22  DATE OF ADMISSION:  02/20/2013 DATE OF DISCHARGE:  02/23/2013  DATE OF ADMISSION: 02/20/2013.   DATE OF DISCHARGE: 02/23/2013.   For a detailed note, please see the history and physical done on admission by Dr. Jacques Navy.     DIAGNOSES AT DISCHARGE: Altered mental status about metabolic encephalopathy from urinary tract infection, hypoglycemia, systemic inflammatory response syndrome, likely secondary to urinary tract infection, urinary tract infection secondary to Escherichia coli, chronic kidney disease, stage III, diabetes with episodes of hypoglycemia, severe peripheral vascular disease status post left below-the-knee amputation, diabetic neuropathy.   DISCHARGE DIET:  The patient is being discharged on a low-sodium, low-fat, carb-controlled diet.   ACTIVITY: As tolerated.   FOLLOW-UP: Dr. Hillery Aldo in 1 to 2 weeks.   DISCHARGE MEDICATIONS:  Omeprazole 20 mg daily, amlodipine 5 mg daily, gabapentin 600 mg at bedtime as needed, lisinopril 5 mg daily, Colace 100 mg b.i.d., aspirin 325 mg daily, nystatin q.i.d. as needed, glargine insulin 30 units at bedtime, Ceftin 5 mg b.i.d. x 5 days.   PERTINENT STUDIES DONE DURING THE HOSPITAL COURSE: Are as follows: A chest x-ray done on admission showing prominence of interstitial markings. No lobar pneumonia or effusion.   Urine culture to be positive for Escherichia coli. Sensitive to ceftriaxone.   HOSPITAL COURSE: This is a 59 year old male with medical problems as mentioned above, presented to the hospital with altered mental status and also noted to be hypoglycemic.    1.  Altered mental status. The most likely cause of patient's mental status change was related to metabolic encephalopathy from urinary tract infection also underlying hypoglycemia. The patient presented to the hospital with a blood sugar of 16. The patient was started on a dextrose drip. Also placed  on IV antibiotics for the urinary tract infection. The patient's mental status has significantly improved since admission back to baseline now. He currently is being discharged on oral Ceftin for his urinary tract infection. His Lantus dose was adjusted as he was taking 45 units prior to coming in was lowered to 30 units and he has had no further episodes of hypoglycemia while in the hospital here.  2.  Systemic inflammatory response syndrome. This is likely secondary to the urinary tract infection. The patient was given IV fluids and given empiric antibiotics  with ceftriaxone. His urine cultures grew out Escherichia coli. After getting IV fluids and IV antibiotics, he is currently afebrile and hemodynamically stable. His white cell count is trending down and now normalized. He is therefore being discharged home on oral Ceftin for his urinary tract infection. 3.  Leukocytosis. This is likely secondary to urinary tract infection and also stress mediated from the hyperglycemia. His white cell count was 04540 on admission, down to 12,000 on the day of discharge.  4.  Chronic kidney disease, stage III. The patient's creatinine is at baseline anywhere between 2 and 3. On admission, the patient's creatinine was as high as 2.2. It stayed around 2.4. On the day of discharge, it is similar that. He does not have a nephrologist he sees as an outpatient. He is therefore referred to see Dr. Cherylann Ratel, Dr. Thedore Mins or Dr. Wynelle Link as outpatient. His angiotensin-converting enzyme inhibitor was initially held, but is being resumed upon discharge as his creatinines are baseline.  5.  Diabetes. The patient has brittle diabetes. His sugars were significantly low when he presented to the hospital, as low as 16. He  was given dextrose fluids. His hemoglobin A1c was high as 9.3. Given his chronic kidney disease, insulin likely sits in his system for a longer period of time and therefore I lowered his Lantus dose prior to discharge.  6.   Diabetic neuropathy. The patient maintained on his Neurontin, he will resume that.  7.  Gastroesophageal reflux disease.  The patient was maintained on his omeprazole he will resume that.  8.  Tobacco abuse. The patient was maintained on a nicotine patch. He was strongly advised to quit smoking upon discharge.   CODE STATUS: The patient is a full code.   TIME SPENT: 40 minutes.  ____________________________ Rolly PancakeVivek J. Cherlynn KaiserSainani, MD vjs:cc D: 02/23/2013 13:17:59 ET T: 02/23/2013 17:53:03 ET JOB#: [id number]  cc: Rolly PancakeVivek J. Cherlynn KaiserSainani, MD, <Dictator> Sarah "Sallie" Allena KatzPatel, MD Houston SirenVIVEK J SAINANI MD ELECTRONICALLY SIGNED 02/24/2013 20:43

## 2014-12-04 NOTE — H&P (Signed)
PATIENT NAME:  Robert [name], Robert [name] MR#:  [id number] DATE OF BIRTH:  August 13, 1956  DATE OF ADMISSION:  06/05/2013  REFERRING PHYSICIAN: Dr. Jasmine December.   PRIMARY CARE PHYSICIAN: Dr. Denton Lank.   CHIEF COMPLAINT: Not feeling well, aspiration.   HISTORY OF PRESENT ILLNESS: Robert [name] is a 59 year old African American gentleman with past medical history of hypertension, type 2 diabetes insulin requiring, chronic kidney disease with baseline between 4 and 5 creatinine, peripheral arterial disease status post left BKA, as well as a recent CVA which left him aphasic and dysphasic therefore requiring PEG tube placement and made n.p.o. He was at his nursing facility at [facility], and he was noted to vomit twice and then choked when he was drinking water despite the fact that he was n.p.o. He is unable to provide much further information other than he states that he felt sick. On arrival, he was found to be in acute kidney injury as well as hyperkalemic.   REVIEW OF SYSTEMS: Unable to obtain secondary to the patient's baseline mental status.   PAST MEDICAL HISTORY: Hypertension, type 2 diabetes insulin requiring, chronic kidney disease with creatinine between 4 and 5, peripheral arterial disease status post left BKA, gastroesophageal reflux disease, CVA with residual aphasia and dysphagia, status post PEG tube placement.   FAMILY HISTORY: Positive for diabetes.   SOCIAL HISTORY: Currently resides at [facility]. Prior to this had 1 pack a day smoking history which was extensive. No recent alcohol or drug usage. He is single, non-married. Closest relatives are his brothers and sisters. His sister, Robert [name], was the decision maker on his last admission.   ALLERGIES: No known allergies.   HOME MEDICATIONS: Acephen 1 suppository rectal q.6 hours as needed for pain or fever, aspirin 325 mg p.o. daily, atorvastatin 20 mg p.o. daily (on hold from October 21 to October 26), Diflucan 100 mg p.o. daily for 3 days,  Diocto 10 mg/mL 10 mL p.o. b.i.d. for stool softener, gabapentin 300 mg 2 caps daily at bedtime, Levemir 35 units subcutaneous injection once a day at nighttime, lisinopril 10 mg p.o. daily, milk of magnesia 8% oral solution 30 mL once daily as needed for constipation, Norvasc 10 mg p.o. daily, NovoLog insulin sliding scale, Protonix 40 mg granules daily.   PHYSICAL EXAMINATION:  VITAL SIGNS: Temperature 98.1, heart rate 91, respirations 20, blood pressure 117/78, saturating 96% on room air. Weight 81.6 kg, BMI 24.4.  GENERAL: Chronically ill-appearing African American gentleman who is in no acute distress.  HEAD: Normocephalic, atraumatic.  EYES: Pupils equal, round, react to light. Extraocular muscles intact. No scleral icterus.  MOUTH: Dry mucosal membranes. Dentition intact. No abscesses noted.  EARS, NOSE, THROAT: Throat clear without exudate. No external lesions.  NECK: Supple. No thyromegaly. No nodules. No JVD.  PULMONARY: Diffuse right-sided coarse breath sounds with scattered rhonchi. No use of accessory muscles. Good respiratory effort.  CHEST: Nontender to palpation.  CARDIOVASCULAR: S1, S2, regular rate and rhythm. No murmurs, rubs or gallops. No edema. Pedal pulses 2+ on the right side.  GASTROINTESTINAL: Soft, nontender, nondistended. No masses. Positive bowel sounds. No hepatosplenomegaly.  MUSCULOSKELETAL: No swelling, clubbing or edema. Range of motion is limited in right lower extremity secondary to neurological condition. Upper extremity range of motion full passively.  NEUROLOGIC: Cranial nerves II through XII intact. Sensation intact. Reflexes intact.  SKIN: No ulcerations, lesions, rashes. No cyanosis. Skin is warm and dry. Turgor is decreased.  PSYCHIATRIC: Difficult to assess secondary to the patient's mental status. He  is awake and able to answer simple questions with yes or no. Insight and judgment are poor.   LABORATORY DATA: Sodium 144, potassium 7.7, chloride 117,  bicarb 24, BUN 88, creatinine 3.69, glucose 306. Lipase 28. Total protein 8, albumin 2.9, bilirubin 0.2, alk phos 246, AST 70, ALT 157. WBC 17.6, hemoglobin 14.7, platelets of 425. Urinalysis 3+ leukocyte esterase, nitrite negative, protein greater than 500. EKG: Normal sinus rhythm, heart rate 93. No ST or T abnormalities. No peaked T waves. Chest x-ray refills blunting of the left costophrenic angle.   ASSESSMENT AND PLAN: A 59 year old gentleman with history of hypertension, diabetes, chronic kidney disease and stroke with residual aphasia and dysphagia, presenting after he was noted to aspirate on water despite being n.p.o.  1. Acute kidney injury on chronic kidney disease, likely prerenal in nature: Intravenous fluid hydration. Follow renal function. Hold nephrotoxic agents including nonsteroidal anti-inflammatory drugs and angiotensin-converting enzyme inhibitors.  2. Hyperkalemia: No evidence of EKG change. Treat with calcium and some Kayexalate. Follow basic metabolic panel 4 hours after treatment. If does not improve, will need nephrology input. 3. Sepsis: WBC and respiratory rate secondary to aspiration pneumonitis. No clear evidence on chest x-ray of infiltrate when compared to previous films. The patient is at high risk. He has diffuse coarse breath sounds and elevated white count. It is reasonable to continue antibiotics with Zosyn for now as well as DuoNeb therapy and supplemental oxygen.  4. Type 2 diabetes: As he is n.p.o., will hold Levemir and continue insulin sliding scale until he is tolerating tube feedings.  5. Hypertension: Noted to be hypotensive hold all medications 6. Venous thromboembolus prophylaxis: Heparin subcutaneous.   The patient is FULL CODE.   TIME SPENT: 55 minutes.   ____________________________ Aaron Mose. Hower, MD dkh:gb D: 06/05/2013 02:23:55 ET T: 06/05/2013 02:46:31 ET JOB#: [id number]  cc: Aaron Mose. Hower, MD, <Dictator> DAVID Woodfin Ganja MD ELECTRONICALLY  SIGNED 06/05/2013 4:16

## 2014-12-04 NOTE — H&P (Signed)
PATIENT NAME:  Robert [name], Robert [name] MR#:  [id number] DATE OF BIRTH:  1956/02/16  DATE OF ADMISSION:  02/20/2013  REFERRING PHYSICIAN: Maurilio Lovely, MD  PRIMARY CARE PHYSICIAN: Hillery Aldo, MD  CHIEF COMPLAINT: Low blood sugars.   HISTORY OF PRESENT ILLNESS: The patient is a 59 year old African American male with a history of diabetes, history of noncompliance, CKD, peripheral vascular disease and left BKA. Of note, he had a recent hospitalization in May for E. coli UTI. Apparently he has been having noncompliance as an outpatient with uncontrolled sugars and have increased his Lantus to 55 units in the last week or so. Furthermore, there is development of UTI recently and was treated with oral antibiotics, per report we received from PCP's office. The patient recently also developed signs and symptoms or UTI.  He has incontinence and nocturia and stated he has some polyuria to me. In the last couple of days his diet has been rather poor, he states, but he took his insulin at the higher dose of 55 units yesterday. He was brought in after the nurse found him to have blood sugars of 24 and he was not himself. EMS was called. They gave him glucagon and rechecked his sugar, which was 43. Here, initial BNP showed glucose of 41 and improved to 150s with diet. On further investigation, he is also found to have white count of 32,000 without any fever. He is back to his baseline mentally after correction of the hypoglycemia. He has not received antibiotics. In and out catheter was attempted to obtain a UA; however, bladder was empty. He did receive some IV fluids and we are waiting for a urine culture to be sent. He denies having any cough or respiratory issues. He did have a bout of vomiting today. He denies pain.   PAST MEDICAL HISTORY:  1.  Diabetes insulin, requiring.  2.  Noncompliance.  3.  CKD.  4.  Hypertension.  5.  Peripheral vascular disease status post left BKA.  6.  Diabetic peripheral neuropathy.   7.  GERD. 8.  Ongoing tobacco abuse.   PAST SURGICAL HISTORY: Left BKA.  SOCIAL HISTORY: He states he smokes a pack in 3 days. He has been a smoker for 30+ years. No alcohol.  He states he used crack cocaine during the time of previous hospitalization, but denied having any usage now. He is on disability.   FAMILY HISTORY: Diabetes.   ALLERGIES: No known drug allergies.   OUTPATIENT MEDICATIONS:  1.  Amlodipine 5 mg daily. 2.  Bayer aspirin 325 mg.  3.  Docusate sodium 100 mg 2 times a day as needed. 4.  Gabapentin 600 mg at bedtime as needed for pain. 5.  Lantus recently increased to 55 units per patient. 6.  Lisinopril 5 mg daily. 7.  Omeprazole 20 mg daily.  REVIEW OF SYSTEMS:   CONSTITUTIONAL: No fever but had some weakness earlier today and could not get up from bed. No weight changes.  EYES: Occasional blurry vision. No double vision.  ENT: No tinnitus or hearing loss.  RESPIRATORY: Denies cough, hemoptysis, shortness of breath or COPD. CARDIOVASCULAR: Denies chest pain, swelling in the legs, arrhythmia or passing out.  GASTROENTEROLOGY:  A bout of vomiting this morning. No abdominal pain. No diarrhea, hematemesis, melena or bright red blood per rectum.  GENITOURINARY: Has had polyuria, nocturia and incontinence. Denies dysuria, however.  HEME AND LYMPHATIC:  Denies anemia or easy bruising.  SKIN: He bumped his right lower extremity against a table  and the area is a little excoriated.  NEUROLOGIC: Denies stroke or TIA.  Has occasional headaches. PSYCH:  Denies anxiety or insomnia.   PHYSICAL EXAMINATION: VITAL SIGNS: Temperature on arrival 97.6, pulse rate 105, respiratory rate 22, blood pressure 159/96 and O2 sat 99% on room air.  GENERAL: The patient is a chronically ill-appearing African American male lying in bed in no obvious distress.  HEENT: Normocephalic, atraumatic. Pupils are equal and reactive. Anicteric sclerae. Very dry mucous membranes. Poor dentition.   NECK: Supple. No thyroid tenderness. No cervical lymphadenopathy.  HEART:  S1 and S2, regular rate and rhythm. No murmurs, rubs or gallops.  LUNGS: Clear to auscultation. No wheezing, rhonchi or rales.  ABDOMEN: Soft, nontender and nondistended. Positive bowel sounds in all quadrants.  EXTREMITIES: The patient has some excoriation with light erythema on anterior shin on the right. No evidence of significant drainage or cellulitis. There is no tenderness. Left BKA.  There is no costophrenic angle tenderness on palpation of the back.  NEUROLOGIC:  Cranial nerves appear to be intact, II to XII.  Strength is 5 out of 5 in all extremities.  PSYCHIATRIC: Awake, alert and oriented x 3. Cooperative.   LABORATORY AND RADIOLOGICAL DATA: Initial glucose on a BNP was 41. On a fingerstick it was 155. BUN 29, creatinine 2.21, sodium 144 and potassium 5.2. WBC 32,000, hemoglobin 13.2, platelets 355 and MCV 72. UA has been ordered and has been sent to the lab. Urine cultures are pending collection.  Chest x-ray, PA and lateral:  Prominence of interstitial markings concerning for underlying pneumonitis, although edema could give a similar appearance. No overt pneumonia or effusion.   EKG: Normal sinus rhythm.  Heart rate is 96.  No acute ST elevations or depressions.   ASSESSMENT AND PLAN: We have a 59 year old male with recent admission for urinary tract infection and possible urinary tract infection as an outpatient who had a course of oral antibiotics, unknown what, recently, who then developed recurrent urinary tract infection symptoms in the setting of hypoglycemia and increase in Lantus who presents with low blood sugars and poor p.o. intake in the last couple of days and leukocytosis and tachycardia concerning for systemic antiinflammatory response syndrome criteria, possible sepsis. The patient has been having decreased p.o. intake, but did take his insulin at higher dose of 55 units yesterday morning. At  this point, the blood sugars have been improved .  Would continue diet, decrease his Lantus, check a Hemoglobin A1c and start a sliding scale insulin. The patient came in with tachycardia and leukocytosis with possible infection, potentially the urine. We are waiting for urinalysis and urine culture, however, as he was recently symptomatic and continues to have polyuria, the patient likely has a urinary tract infection and sepsis. Would wait for the urine culture, continue ceftriaxone for now, obtain a sputum culture and follow with the blood cultures. The patient does not appear to have clinical pyelo as he does not have significant costophrenic angle tenderness.  Would trend the white count. We would give the patient Kayexalate and hold the lisinopril for the hyperkalemia. His chronic kidney disease appears to be at baseline. He appears to have stage III chronic kidney disease. We would hold ACE inhibitor at this point and recheck potassium later tomorrow. Would continue his gabapentin and proton pump inhibitor for his diabetic neuropathy and gastroesophageal reflux disease. He does smoke cigarettes and he was counseled for 3 minutes. We would start him on a patch per his wishes. Urine  tox screen has also been ordered as he has used crack cocaine in the past which he denied using more recently.   The patient is FULL CODE.  TIME SPENT:  55 minutes.  ____________________________ Krystal EatonShayiq Shelda Truby, MD sa:sb D: 02/20/2013 16:23:45 ET T: 02/20/2013 16:41:00 ET JOB#: [id number]  cc: Krystal EatonShayiq Jalin Alicea, MD, <Dictator> Sarah "Sallie" Allena KatzPatel, MD Krystal EatonSHAYIQ Daviel Allegretto MD ELECTRONICALLY SIGNED 03/15/2013 11:39

## 2014-12-04 NOTE — H&P (Signed)
PATIENT NAME:  Robert [name], Robert [name] MR#:  [id number] DATE OF BIRTH:  [birth year]  DATE OF ADMISSION:  09/21/2012  PRIMARY CARE PHYSICIAN: Dr. Hillery AldoSarah Patel.    REFERRING PHYSICIAN: Dr. Dorothea GlassmanPaul Malinda.   CHIEF COMPLAINT: Transferred by EMS when the patient found unresponsive.   HISTORY OF PRESENT ILLNESS: The patient is a 59 year old African American male with a history of diabetes mellitus, type 2, on insulin, peripheral vascular disease status post left below-knee amputation. The patient was in his usual state of health until the incident today when the neighbors found him in his basement unresponsive. They called EMS. Upon their arrival they checked his blood sugar, it was in his low, reaching 23. He received dextrose D50 along with glucagon and transported to the Emergency Department during which he received also intravenous fluids. By the time the patient arrived here, he was regaining his consciousness. Evaluation here reveals worsening of his kidney function and evidence of rhabdomyelolysis, and it appears that he has right lower lobe infiltrates consistent with pneumonia as well. The patient does report some cough and probably it is a little bit worse than his baseline. He is a chronic smoker. For all the above reasons, the patient was admitted to the hospital for further evaluation and treatment.   REVIEW OF SYSTEMS:  CONSTITUTIONAL: Denies having fever. No chills. At this point he has no fatigue as well.  EYES: No blurring of vision. No double vision.  ENT: No hearing impairment. No sore throat. No dysphagia.  CARDIOVASCULAR: No chest pain. No shortness of breath. No edema other than a swelling of the left wrist a recent gout arthritis.  RESPIRATORY: He reports cough with a little sputum production. No hemoptysis. No chest pain. No shortness of breath.  GASTROINTESTINAL: No abdominal pain, no vomiting, no diarrhea.  GENITOURINARY: No dysuria. No frequency of urination.  MUSCULOSKELETAL: No joint  pain or swelling other than the swelling of the left wrist area. No muscular pain or swelling.  INTEGUMENTARY: No skin rash. No ulcers.  NEUROLOGY: No focal weakness. No seizure activity. No headache.  PSYCHIATRY: No anxiety. No depression.  ENDOCRINE: No polyuria or polydipsia. No heat or cold intolerance.  HEMATOLOGY: No easy bruisability. No lymph node enlargement.   PAST MEDICAL HISTORY: Diabetes mellitus type 2, on insulin. The last admission to this hospital was in September 2013 with diabetic ketoacidosis secondary to noncompliance. Systemic hypertension, chronic kidney disease, stage III, with his creatinine baseline ranging between 2.7 in September of last year to 1.58. Peripheral vascular disease status post left below-knee amputation, diabetic peripheral neuropathy, gastroesophageal reflux disease.   PAST SURGICAL HISTORY: Left-sided below-knee amputation.   SOCIAL HABITS: A chronic smoker, 1 pack per day for more than 30 years. No history of alcoholism. However, he admits abusing crack cocaine every now and then.   SOCIAL HISTORY: The patient is unemployed, living on disability. He is living alone and never married.   FAMILY HISTORY: Reports that both parents were healthy and they died. He does not know exactly at what age. He has one uncle who has diabetes mellitus.   ADMISSION MEDICATIONS: Lantus 40 units in the evening. Gabapentin 600 mg a day, lisinopril 5 mg a day, omeprazole 20 mg a day, tramadol 50 mg q.6 hours p.r.n., and metoprolol tartrate 25 mg twice a day.   ALLERGIES: No known drug allergies.   PHYSICAL EXAMINATION:  VITAL SIGNS: Blood pressure 173/97, respiratory rate 20, pulse 72, temperature 97.4. Oxygen saturation 97%.  GENERAL APPEARANCE: Middle-aged male lying  in bed in no acute distress. He is fully awake now. He is disheveled, not well kept, and smells urine.  HEAD AND NECK EXAMINATION: No pallor. No icterus. No cyanosis.  EAR EXAMINATION: Revealed normal  hearing, no discharge, no lesions.  NOSE: Nasal mucosa was normal without discharge. No bleeding.  OROPHARYNGEAL AREA: Showed the patient is edontolus. He is not wearing any dentures. There is no oral thrush. No ulcers.  EYE EXAMINATION: Revealed normal eyelids and conjunctiva. Pupils about 4 to 5 mm, round, equal and reactive to light.  NECK: Supple. Trachea at midline. No thyromegaly. No cervical lymphadenopathy. No masses.  HEART EXAM: Normal S1, S2. No S3, S4. No murmur. No gallop. No carotid bruits.  RESPIRATORY EXAM: Showed to be normal breathing pattern without use of accessory muscles. No rales. No wheezing; however, there are a few scattered rhonchi.  ABDOMEN: Soft without tenderness. No hepatosplenomegaly. No masses. No hernias.  SKIN EXAMINATION: Revealed no ulcers. No subcutaneous nodules.  MUSCULOSKELETAL: He has mild clubbing of fingers. There is a left below-knee amputation with a prosthesis on.  NEUROLOGIC EXAMINATION: Cranial nerves II through XII are intact. No focal motor deficit.  PSYCHIATRIC EXAMINATION: The patient is alert and oriented x 3. Mood and affect were normal.   LABORATORY FINDINGS AND RADIOLOGIC DATA: Chest x-ray showed no acute cardiopulmonary abnormalities. However, there are hazy infiltrates on the right lower lobe, consistent with early pneumonia. EKG showed normal sinus rhythm at rate of 78 per minute. Early repolarization pattern. His serum glucose here had improved up to 149 from an initial value of 23 reported by EMS. His BUN 21, creatinine 2.07, sodium 141, potassium 4.8. Estimated GFR is 41. His calcium is 8.3 with albumin of 2.3. Alkaline phosphatase 185, AST 69, ALT 36. Total CPK was 2774. CBC showed white count of 13,000, hemoglobin is 11.9, hematocrit is 37. Platelet count 248.   ASSESSMENT:  1. Unresponsiveness secondary to severe hypoglycemia. He responded to intravenous dextrose 50 along with glucagon.  2. Right lower lobe pneumonia or early  pneumonia.  3. Mild acute renal failure on chronic kidney failure or chronic kidney disease.  4. Rhabdomyolysis.   5. Diabetes mellitus, type 2, on insulin.  6. Systemic hypertension. 7. Diabetic peripheral neuropathy.  8. Gastroesophageal reflux disease.  9. Peripheral vascular disease status post left below-knee amputation.  10. Tobacco abuse.   PLAN: We will admit the patient to the medical floor. Follow-up on Accu-Cheks and place on sliding scale. I will reduce his dose of Lantus to 20 units down from 40 until his blood sugar is stabilized to ensure no recurrence of hypoglycemia. Monitor the kidney function. We will repeat the basic metabolic profile tomorrow. Repeat the CPK tomorrow. IV hydration with normal saline. Blood cultures were taken. IV antibiotic using Zithromax and Rocephin were started. Continue the rest of home medications. I noted that his blood pressure is elevated, and we will place him on amlodipine to be used p.r.n. if systolic blood pressure above 161. The patient has risky habits of smoking of smoking on top of crack cocaine at times while he is diabetic and he has evidence of peripheral vascular disease.   TIME SPENT EVALUATING THIS PATIENT: More than 55 minutes.   ____________________________ Carney Corners. Rudene Re, MD amd:jm D: 09/21/2012 06:10:20 ET T: 09/21/2012 11:34:40 ET JOB#: [id number]  cc: Carney Corners. Rudene Re, MD, <Dictator> Zollie Scale MD ELECTRONICALLY SIGNED 09/21/2012 22:56

## 2014-12-04 NOTE — Discharge Summary (Signed)
PATIENT NAME:  Robert [name], Robert [name] MR#:  [id number] DATE OF BIRTH:  1955-12-05  DATE OF ADMISSION:  09/21/2012  DATE OF DISCHARGE:  09/24/2012  DISCHARGE DIAGNOSES: 1.  Right lower lobe pneumonia, improving on antibiotics.  2.  Acute on chronic kidney disease, stage II to IV. Will require outpatient nephrology followup.  3.  Rhabdomyolysis, now resolved.  4.  Uncontrolled hypertension, improving on Norvasc, metoprolol and clonidine.  5.  Hypoglycemia, now resolved. In fact, she was going up and has hyperkalemia now, resolved, likely due to rhabdomyolysis, status post Kayexalate.  6.  Leukocytosis, likely due to pneumonia, improving on antibiotics.   SECONDARY DIAGNOSES: 1.  Diabetes.  2.  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  3.  Peripheral vascular disease status post left below-knee amputation.  4.  Diabetic neuropathy.  5.  Gastroesophageal reflux disease.   CONSULTATIONS:  None.   PROCEDURES AND RADIOLOGY:  1.  Chest x-ray on the 8th of February showed mild right lower lobe pneumonia.  2.  Chest x-ray on the 9th of February showed bilateral interstitial opacities slightly, increased from prior.  3.  Major laboratory panel: UA on admission showed 1+ bacteria, 29 WBCs, trace leuk esterase, positive nitrite.  4.  Blood cultures x 2 were negative.   HISTORY AND SHORT HOSPITAL COURSE: The patient is a 59 year old male with above-mentioned medical problems, who was admitted for pneumonia. He was started on IV antibiotics. He was also found to have acute on chronic kidney disease, was hydrated with IV fluids. He also had a significant rhabdomyolysis on admission with CK of 2774. With aggressive hydration, his CK has come down to 656 this morning. He was also hypoglycemic on admission, thought to be due to poor p.o. intake and being on oral hypoglycemic, and was started on dextrose water. Was slowly improving. Was off dextrose water, and has been maintaining  his blood sugar without any difficulties.  He was requested to stay in the hospital, but he was very desperate to go home.   The patient was found to be hyperkalemic requiring Kayexalate, and his potassium normalized. The patient was a very adamant to go, even though I requested him to stay as his kidney function was still high, and his sugars being a little bit high. The patient would not want to stay and is being discharged home. On the date of discharge, his vital signs are as follows: Temperature 98.1, heart rate 65 per minute, respirations 18 per minute, blood pressure 173/88 mmHg. He was saturating 96% on room air.   PERTINENT PHYSICAL EXAMINATION:  On the date of discharge:  CARDIOVASCULAR: S1, S2 normal. No murmurs, rub or gallop.  LUNGS: Clear to auscultation bilaterally. No wheezing, rales, rhonchi or crepitation.  ABDOMEN: Soft, benign.  NEUROLOGIC: Nonfocal examination. All other physical examination remained at baseline.   DISCHARGE MEDICATIONS: 1.  Gabapentin 600 mg p.o. daily.  2.  Insulin Lantus 40 units subcutaneous at bedtime.  3.  Omeprazole 20 mg p.o. daily.  4.  Tramadol 50 mg p.o. every 6 hours.  5.  Metoprolol 25 mg p.o. b.i.d.  6.  Prednisone 60 mg p.o. daily, taper 10 mg daily until finished.  7.  Clonidine 0.2 mg p.o. b.i.d.  8.  Amlodipine 10 mg p.o. daily.  9.  Augmentin 875 mg p.o. b.i.d. for 4 more days.  10.  NovoLog FlexPen 5 units subcutaneous 3 times daily with meals, and hold if he was less than 100.  DISCHARGE DIET: Low sodium, 1800 ADA.   DISCHARGE ACTIVITY: As tolerated.   DISCHARGE INSTRUCTIONS AND FOLLOWUP:  The patient was instructed to follow up with his primary care physician, Dr. Hillery Aldo, in 1 to 2 weeks. He will need followup with nephrology, Dr. Wynelle Link, in 2 to 4 weeks.  He was recommended to stop lisinopril considering his kidney disease and was recommended to get CBC and basic metabolic panel checked on coming Friday, on the 14th of  February, with results forwarded to primary care physician, and Dr. Wynelle Link for renal function monitoring.   TOTAL TIME DISCHARGING THIS PATIENT: 55 minutes.      ____________________________ Ellamae Sia. Sherryll Burger, MD vss:dm D: 09/24/2012 15:11:37 ET T: 09/24/2012 21:06:51 ET JOB#: [id number]  cc: Lakeem Rozo S. Sherryll Burger, MD, <Dictator> [name], MD Laverda Sorenson, MD Ellamae Sia [facility] MD ELECTRONICALLY SIGNED 09/25/2012 16:42

## 2014-12-04 NOTE — H&P (Signed)
PATIENT NAME:  Robert [name], Rawson J MR#:  [id number] DATE OF BIRTH:  01-Feb-1956  DATE OF ADMISSION:  06/20/2013  PRIMARY CARE PHYSICIAN: Dr. Hillery AldoSarah Patel.  CHIEF COMPLAINT: Nausea and vomiting.   HISTORY OF PRESENT ILLNESS: This is a 59 year old man, who was recently discharged from the hospital,  had aspiration pneumonia and C. difficile colitis. He presents back to the hospital with nausea, vomiting with multiple episodes of vomiting in the Emergency Room. He was found to be in acute on chronic renal failure. Chest x-ray still showed aspiration pneumonia like the last admission. His white count is high. The patient does not talk much, but does shake his head yes and no questions. Hospitalist services were contacted for further admission. The patient having nausea, vomiting and abdominal pain, fever and just slight chest pain, but cannot elaborate on symptoms secondary to aphasia.   PAST MEDICAL HISTORY: Hypertension, diabetes, chronic kidney disease, peripheral artery disease, gastroesophageal reflux disease and CVA with residual aphasia and dysphagia.   PAST SURGICAL HISTORY: PEG placement and left BKA.   SOCIAL HISTORY: Lives at [facility]. He used to smoke in the past. No alcohol or drug use.   ALLERGIES: No known drug allergies.   FAMILY HISTORY: As per old chart. Positive for diabetes.   MEDICATIONS:  Include acetaminophen suppository every 6 hours as needed, aspirin 325 mg daily, atorvastatin 20 mg daily, Colace 10 mL twice a day, gabapentin 300 mg 2 capsules daily, glucagon p.r.n., Detemir insulin 25 units at bedtime, loperamide 2 mg every 4 hours as needed for diarrhea, metoprolol 25 mg twice a day via PEG, metoprolol 25 mg twice a day, metronidazole 500 mg every 8 hours for seven days from the last discharge, milk of magnesia 30 mL daily, Norvasc 10 mg daily, Novolin 70/30, 30 units subcutaneous daily, NovoLog sliding scale, Protonix 40 mg daily and Reglan 5 mg 4 times a day.   REVIEW  OF SYSTEMS: The patient shakes his head yes or no to questions. Unable to elaborate much. CONSTITUTIONAL: Positive for fever. Positive for chills. Positive for sweats. Positive for weakness. EYES: He does wear glasses. EARS, NOSE, MOUTH, AND THROAT: No hearing loss. No sore throat. CARDIOVASCULAR: Positive for chest pain occasionally.  RESPIRATORY: Positive for shortness of breath. Positive for cough. Unable to tell me if there is any sputum. GASTROINTESTINAL: Positive for nausea. Positive for vomiting. Positive for abdominal pain. He cannot describe how much. GENITOURINARY: No burning on urination. No hematuria. MUSCULOSKELETAL: No joint pain. INTEGUMENT: No rashes or eruptions.  NEUROLOGIC: No fainting or blackouts. PSYCHIATRIC: No anxiety. ENDOCRINE: No thyroid problems. HEMATOLOGIC/LYMPHATIC: No anemia.   PHYSICAL EXAMINATION:  VITAL SIGNS: Temperature 97.7, pulse 110, respirations 19, blood pressure 92/56, pulse oximetry 98% on oxygen 4 L.  GENERAL: No respiratory distress.  EYES: Conjunctivae and lids normal. Pupils equal, round, and reactive to light. Extraocular muscles not following commands to look all the way to the left. Ears, nose, mouth and throat: Nasal mucosa: No erythema. Throat: No erythema. No exudate seen. Lips and gums: No lesions.  NECK: No JVD. No bruits. No lymphadenopathy. No thyromegaly. No thyroid nodules palpated.  RESPIRATORY: Positive rhonchi throughout entire lung field. No use of accessory muscles to breathe.  CARDIOVASCULAR SYSTEM: S1, S2 normal. Tachycardic. No gallops, rubs or murmurs heard. Carotid upstroke 2+ bilaterally. No bruits.  EXTREMITIES: Dorsalis pedis pulse on the right foot, 1+ bilaterally and 2+ edema of the right lower extremity.  ABDOMEN: Soft. Slight tenderness. No organomegaly/splenomegaly. Normoactive bowel sounds. No  masses felt.  LYMPHATIC: No lymph nodes in the neck.  MUSCULOSKELETAL: 2+ edema right lower extremity. No clubbing, no cyanosis on  oxygen.  SKIN: Anteriorly no ulcers seen.  NEUROLOGIC: The patient unable to move left arm. Able to move her right side. Power 5 out of 5 right side. The patient is aphasic.  PSYCHIATRIC: The patient is alert and oriented, but unable to test orientation.   ASSESSMENT AND PLAN:  1.  Acute renal failure with nausea and vomiting. We will give IV fluid hydration and keep n.p.o. Supportive care. Could be diabetic gastroparesis. We will continue Reglan p.r.n. and Zofran. We will hold on the tube feeding at this point in time.  2.  Clinical sepsis. The patient is being treated for Clostridium difficile colitis and had recent aspiration pneumonia. We will continue Flagyl IV and send a stool for Clostridium difficile.  3.  Acute respiratory failure on 4 L of oxygen. We will continue to monitor.  4.  Recent aspiration pneumonia. The patient was recently on Zosyn, but antibiotics were stopped. Chest x-ray shows increasing infiltrates in bilateral lung fields. I am hesitant on starting antibiotics, but I will give Invanz 1 gram intravenous stat and daily and continue to monitor closely.  5.  Relative hypotension. Will hold Norvasc at this time. Give intravenous fluids. Low-dose metoprolol will be continued.  6.  History of cerebrovascular accident with aphasia and dysphasia. Hold tube feeds. Hold aspirin at this time with the nausea and vomiting.  7.  Diabetes. Sugar okay. Will hold diabetic medications at this time while n.p.o.   TIME SPENT ON ADMISSION: 55 minutes.   CODE STATUS: Full code.   ____________________________ Herschell Dimes. Renae Gloss, MD rjw:aw D: 06/20/2013 12:49:20 ET T: 06/20/2013 13:14:10 ET JOB#: [id number]  cc: Herschell Dimes. Renae Gloss, MD, <Dictator> [name], MD [name] MD ELECTRONICALLY SIGNED 06/20/2013 18:59

## 2014-12-04 NOTE — Op Note (Signed)
PATIENT NAME:  Robert [name], Robert [name] MR#:  [id number] DATE OF BIRTH:  1956-01-13  DATE OF PROCEDURE:  05/13/2013  PREOPERATIVE DIAGNOSES:  1.  Left extremity pain.  2.  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  3.  Renal insufficiency.  4.  Diabetes.   POSTOPERATIVE DIAGNOSES:  1.  Left extremity pain.  2.  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  3.  Renal insufficiency.  4.  Diabetes.   PROCEDURES PERFORMED: 1.  Arch aortogram.  2.  Left upper extremity distal runoff, third order catheter placement.   SURGEON: Renford Dills, M.D.   SEDATION: Versed 2 mg plus fentanyl 50 mcg administered IV. Continuous ECG, pulse oximetry and cardiopulmonary monitoring is performed throughout the entire procedure by the interventional radiology nurse. Total sedation time was 1.5 hours.   ACCESS: 6-French sheath, right common femoral artery.   CONTRAST USED: Isovue 100 mL.   FLUOROSCOPY TIME: 26.1 minutes.   INDICATIONS: Robert [name] is a 59 year old gentleman with multiple medical problems who presented to the office with increasing pain in his right upper extremity. Noninvasive studies as well as physical examination demonstrated significant atherosclerotic occlusive disease and preparations for angiography were made. The risks and benefits were reviewed. All questions answered. The patient has agreed to proceed.   DESCRIPTION OF PROCEDURE: The patient is taken to special procedures and placed in the supine position. After adequate sedation is achieved, both groins are prepped and draped in sterile fashion. Ultrasound is placed in a sterile sleeve. Ultrasound is utilized secondary to lack of appropriate landmarks and to avoid vascular injury. Under direct ultrasound visualization, access is obtained to the common femoral artery, microwire followed by micro sheath, J-wire followed by a 5-French sheath and 5-French pigtail catheter. Pigtail catheter is  advanced into the ascending aorta. On LAO projection, the pigtail catheter is not opened up. It appears similar to an image of a typical RAO projection and therefore I shifted the detector into the RAO and the arch was now splayed open. It is also noted the catheter is coming up and at the level of the diaphragm crosses from the left side of the spine to the right side and that the thoracic portion of the patient's arch is right-sided.   RAO projection is then obtained and this demonstrates reversal of the normal arch anatomy. There is a common origin for the left common carotid and left arm, i.e. subclavian. There is also a common origin for the right common carotid and right subclavian, however, this rather than being the closest origin to the valve is now the farthest away located at the apex of the arch. It is a type III arch which is then versed over what is typically seen. Using first a stiff Glide Simmons I and a JB-1 attempts at cannulating the left subclavian were made. Ultimately, this was successful with the combination of the glidewire and then a H1 catheter and an Advantage wire. The Advantage wire and H1 catheter was then advanced out into the brachial artery and distal runoff is obtained. With the wire well out into the brachial distribution, a 6-French shuttle sheath is then advanced over the wire and positioned at the level of the origin and RAO and LAO projections of the origin are obtained as the heavy calcification is noted on fluoroscopy. However, once these images were reviewed there did not appear to be any hemodynamically significant stenosis in the origin of the subclavian. As well, the common carotid appears to be  widely patent.   The sheath and wire were then pulled into the descending aorta, J-wire was then exchanged for the Advantage wire and a 6-French sheath exchanged for the shuttle. Mynx device was then deployed with success. There are no problems noted at the access site;  however, on transferring the patient to the bed it is noted he is not moving his left arm and left foot and does not appear to be waking up from his sedation. He will be taken for a stat head CT and appears to have had an intraprocedural CVA in the right hemispheric distribution. Medicine will be asked to evaluate the patient as well, and he will be admitted post procedure.   INTERPRETATION: The arch is opacified with contrast. It appears to be an inverted arch with a right-sided ascending component and the great vessels are as noted above, all reversed with the left common carotid and left subclavian sharing a common origin, which is closest to the aortic valve, and the right common carotid and right subclavian originating off the apex. It is a type III arch. There is heavy calcification at the origin of the subclavian, but in magnified oblique views there does not appear to be hemodynamically significant stenosis. The subclavian, axillary and brachial arteries are widely patent. Trifurcation is noted and the radial and interosseous are widely patent. The ulnar occludes shortly after its origin and remains occluded throughout the majority of its course down to the wrist. There is reconstitution via the interosseous of the ulnar as it passes into the palm. Radial and interosseous fill the pedal arch and all digital vessels.   Due to the length of the total occlusion with the ulnar as well as the excellent collateral flow via the interosseous and the widely patent radial, intervention is not undertaken at this time.  ____________________________ Renford DillsGregory G. Jasiah Buntin, MD ggs:sb D: 05/13/2013 12:49:23 ET T: 05/13/2013 13:01:50 ET JOB#: [id number]  cc: Renford DillsGregory G. Falicia Lizotte, MD, <Dictator> Sarah "Sallie" Allena KatzPatel, MD Renford DillsGREGORY G Justis Closser MD ELECTRONICALLY SIGNED 05/16/2013 10:03

## 2014-12-04 NOTE — Consult Note (Signed)
(Removed):  Referring Physician (Removed):   (Removed):  Primary Care Physician (Removed):   Reason for Consult: Admit Date: 13-May-2013  Chief Complaint: left side weakness  Reason for Consult: CVA   History of Present Illness: History of Present Illness:   Entered in error   ROS:  General denies complaints   HEENT no complaints   Lungs no complaints   Cardiac no complaints   GI no complaints   GU no complaints   Musculoskeletal no complaints   Extremities no complaints   Skin no complaints   Endocrine no complaints   Psych no complaints   Past Medical/Surgical Hx:  GERD - Esophageal Reflux:   HTN:   Diabetes:   left below the knee amputation:   Home Medications: Medication Instructions Last Modified Date/Time  insulin glargine 100 units/mL subcutaneous solution 35 unit(s) subcutaneous once a day (at bedtime) 30-Sep-14 09:39  gabapentin 600 mg oral tablet 1 tab(s) orally once a day (at bedtime), As Needed - for Pain 30-Sep-14 09:39  lisinopril 5 mg oral tablet 1 tab(s) orally once a day for blood pressure and kidney protection 30-Sep-14 09:39  docusate sodium 100 mg oral capsule 1 cap(s) orally 2 times a day, As Needed - for Constipation 30-Sep-14 09:39  omeprazole 20 mg oral delayed release capsule 1 cap(s) orally once a day for reflux 30-Sep-14 09:39  amLODIPine 5 mg oral tablet 1 tab(s) orally once a day for blood pressure 30-Sep-14 09:39   Allergies:  No Known Allergies:   Lab Results:  Routine Micro:  10-Jul-14 16:08   Organism Name ESCHERICHIA COLI  Organism Quantity >100,000 CFU/ML  Nitrofurantoin Sensitivity I  Cefazolin Sensitivity S  Ampicillin Sensitivity R  Ceftriaxone Sensitivity S  Ciprofloxacin Sensitivity R  Gentamicin Sensitivity S  Imipenem Sensitivity S  Trimethoprim/Sulfamethoxazole Sensitivty R  Lefofloxacin Sensitivity R  Specimen Source IN AND OUT CATH  Organism 1 >100,000 CFU/ML ESCHERICHIA COLI  Result(s) reported on  22 Feb 2013 at 12:02PM.    16:17   Micro Text Report BLOOD CULTURE   COMMENT                   NO GROWTH AEROBICALLY/ANAEROBICALLY IN 5 DAYS   ANTIBIOTIC                       Culture Comment NO GROWTH AEROBICALLY/ANAEROBICALLY IN 5 DAYS  Result(s) reported on 25 Feb 2013 at 04:00PM.  Routine Chem:  11-Jul-14 05:24   Result Comment GLUCOSE - NOTIFIED OF CRITICAL VALUE  - RESULTS VERIFIED BY REPEAT TESTING.  - READ-BACK PROCESS PERFORMED.  - C/MICHELLE VASTERLING 02/21/13 0612 [facility]  Result(s) reported on 21 Feb 2013 at 06:03AM.  Magnesium, Serum 1.8 (1.8-2.4 THERAPEUTIC RANGE: 4-7 mg/dL TOXIC: > 10 mg/dL  -----------------------)  Hemoglobin A1c ([facility])  9.3 (The American Diabetes Association recommends that a primary goal of therapy should be <7% and that physicians should reevaluate the treatment regimen in patients with HbA1c values consistently >8%.)  13-Jul-14 05:10   Glucose, Serum  139  BUN  31  Creatinine (comp)  2.44  Sodium, Serum 143  Potassium, Serum 4.4  Chloride, Serum  111  CO2, Serum 26  Calcium (Total), Serum  8.1  Anion Gap  6  Osmolality (calc) 294  eGFR (African American)  33  eGFR (Non-African American)  29 (eGFR values <66m/min/1.73 m2 may be an indication of chronic kidney disease (CKD). Calculated eGFR is useful in patients with stable renal function. The eGFR calculation  will not be reliable in acutely ill patients when serum creatinine is changing rapidly. It is not useful in  patients on dialysis. The eGFR calculation may not be applicable to patients at the low and high extremes of body sizes, pregnant women, and vegetarians.)  Urine Drugs:  24-MWN-02 72:53   Tricyclic Antidepressant, Ur Qual (comp) NEGATIVE (Result(s) reported on 20 Feb 2013 at 04:30PM.)  Amphetamines, Urine Qual. NEGATIVE  MDMA, Urine Qual. NEGATIVE  Cocaine Metabolite, Urine Qual. POSITIVE  Opiate, Urine qual NEGATIVE  Phencyclidine, Urine Qual. NEGATIVE  Cannabinoid,  Urine Qual. NEGATIVE  Barbiturates, Urine Qual. NEGATIVE  Benzodiazepine, Urine Qual. NEGATIVE (----------------- The URINE DRUG SCREEN provides only a preliminary, unconfirmed analytical test result and should not be used for non-medical  purposes.  Clinical consideration and professional judgment should be  applied to any positive drug screen result due to possible interfering substances.  A more specific alternate chemical method must be used in order to obtain a confirmed analytical result.  Gas chromatography/mass spectrometry (GC/MS) is the preferred confirmatory method.)  Methadone, Urine Qual. NEGATIVE  Routine UA:  10-Jul-14 16:08   Color (UA) Amber  Clarity (UA) Turbid  Glucose (UA) Negative  Bilirubin (UA) Negative  Ketones (UA) Negative  Specific Gravity (UA) 1.018  Blood (UA) 2+  pH (UA) 5.0  Protein (UA) >=500  Nitrite (UA) Negative  Leukocyte Esterase (UA) 3+ (Result(s) reported on 20 Feb 2013 at 04:36PM.)  RBC (UA) 17 /HPF  WBC (UA) 688 /HPF  Bacteria (UA) 2+  Epithelial Cells (UA) 6 /HPF  WBC Clump (UA) PRESENT (Result(s) reported on 20 Feb 2013 at 04:36PM.)  Routine Hem:  10-Jul-14 13:02   Reference Accession# [id number] (Result(s) reported on 20 Feb 2013 at 02:27PM.)  13-Jul-14 05:10   WBC (CBC)  12.7  RBC (CBC) 5.09  Hemoglobin (CBC)  11.7  Hematocrit (CBC)  36.7  Platelet Count (CBC) 263  MCV  72  MCH  23.0  MCHC  31.9  RDW  15.6  Neutrophil % 54.5  Lymphocyte % 31.8  Monocyte % 7.9  Eosinophil % 5.3  Basophil % 0.5  Neutrophil #  6.9  Lymphocyte #  4.0  Monocyte # 1.0  Eosinophil # 0.7  Basophil # 0.1 (Result(s) reported on 23 Feb 2013 at 05:46AM.)   Electronic Signatures: Anabel Bene (MD)  (Signed 01-Oct-14 11:04)  Authored: REFERRING PHYSICIAN, Primary Care Physician, Consult, History of Present Illness, Review of Systems, PAST MEDICAL/SURGICAL HISTORY, HOME MEDICATIONS, ALLERGIES, LAB RESULTS   Last Updated: 01-Oct-14 11:04 by  Anabel Bene (MD)

## 2014-12-04 NOTE — Consult Note (Signed)
PATIENT NAME:  Robert [name], Robert [name] MR#:  [id number] DATE OF BIRTH:  [birth year]  DATE OF CONSULTATION:  05/16/2013  REFERRING PHYSICIAN:   CONSULTING PHYSICIAN: Dr. Gilda CreaseSchnier.  PRIMARY CARE PHYSICIAN: Robert AldoSarah Patel, MD.  VASCULAR SURGEON: Dr. Gilda CreaseSchnier.   GASTROENTEROLOGIST: Dr. Midge Miniumarren [name].  REASON FOR CONSULTATION: Consider PEG tube placement for dysphagia.   HISTORY OF PRESENT ILLNESS: Robert [name] is an unfortunate 59 year old black male, who was admitted with acute CVA. He has history of hypertension, diabetes mellitus, noncompliance with chronic kidney disease and peripheral arterial disease. He has left-sided deficits. He failed a swallowing study. We were to consider PEG placement. Today, his potassium is 5.6. I have discussed this with Dr. Henrene HawkingKephart, anesthesiologist, who states he will not be able to undergo sedation for procedure today. The patient is unable to give me any history, so most of the history was obtained through the medical record and his sister, Robert [name].   PAST MEDICAL AND SURGICAL HISTORY: CVA, hypertension, diabetes mellitus, chronic kidney disease, peripheral arterial disease, left below the knee amputation, peripheral neuropathy, GERD, tobacco abuse, and noncompliance.   MEDICATIONS PRIOR TO ADMISSION: Omeprazole 20 mg daily, lisinopril 5 mg daily, Lantus 35 units subcutaneous at bedtime, gabapentin 600 mg daily, Colace 100 mg b.i.d. p.r.n. for constipation and Norvasc 5 mg p.o. daily.   ALLERGIES: No known drug allergies.   FAMILY HISTORY: Noncontributory.   SOCIAL HISTORY: He lives in [place]. He has never been married. He has 2 sisters and 4r brothers. Robert [name], his sister, is assisting with making medical decisions since he is unable.   REVIEW OF SYSTEMS: Unable to obtain.   PHYSICAL EXAMINATION:  VITAL SIGNS: Temperature 98.4, pulse 100, blood pressure 191/107 and respirations 18.  GENERAL: He is sleeping in bed. He is difficult to arouse.  Nonverbal.  HEENT: Sclerae clear, anicteric. Conjunctivae pink. Oropharynx pink and moist.  NECK: Supple without any mass or thyromegaly.  CHEST: Heart regular rhythm. Normal S1, S2. No murmurs, rubs or gallops.  LUNGS: Clear to auscultation bilaterally.  ABDOMEN: Positive bowel sounds x 4. Soft, nontender, nondistended, without palpable hepatosplenomegaly or mass.  EXTREMITIES: He has  a left below the knee amputation, right with hammertoe and trace pedal edema. Good pulses.  NEUROLOGICAL: He is lethargic, difficult to arouse.   LABORATORY STUDIES: White blood cell count of 13.8, hemoglobin 12.2, hematocrit 38.2, platelets 306. INR 1.1. Glucose 188, BUN 26, creatinine 2.29, potassium 5.6, chloride 117, CO2 18, otherwise normal basic metabolic panel.   IMPRESSION: Robert [name] is an unfortunate 59 year old black male with acute cerebrovascular accident. He failed his swallowing study and therefore we have been consulted to consider PEG placement. I have discussed risks, benefits, and alternatives with his sister, Robert [name], to include, but are not limited to bleeding, infection, perforation, drug reaction, the fact that the tube could become clogged, displaced and it does not percent aspiration. She is in agreement with PEG tube placement and consent was obtained over the phone with myself and the patient's nurse. I have discussed the patient's hyperkalemia with Dr. Henrene HawkingKephart and he has been given Kayexalate today. Unfortunately, he cannot be sedated for PEG placement today. I discussed with Dr. Servando SnareWohl and plans will be made for PEG  placement on Monday.   PLAN:  1.  The patient should remain n.p.o.  2.  Hyperkalemia treatment per attending.  3.  PEG placement by Dr. Servando SnareWohl Monday, 10/06.  Thank you for allowing us to participate in the care of Robert [name].  ____________________________ Robert Arrow, NP klj:aw D: 05/16/2013 12:38:15 ET T: 05/16/2013 12:54:24 ET JOB#: [id number]  cc: Robert Arrow, NP, <Dictator> Robert Dills, MD Robert "Sallie" Robert Katz, MD Robert Arrow FNP ELECTRONICALLY SIGNED 06/04/2013 9:54

## 2014-12-04 NOTE — Discharge Summary (Signed)
PATIENT NAME:  Robert [name], Robert [name] MR#:  [id number] DATE OF BIRTH:  1955/12/08  DATE OF ADMISSION:  05/13/2013 DATE OF DISCHARGE:  05/21/2013  PRESENTING COMPLAINT: Unable to move left upper and lower extremities.   DISCHARGE DIAGNOSES: 1. Acute cerebrovascular accident, right middle cerebral artery territory stroke, with left-sided hemiplegia and dysphagia.  2.  Hypernatremia due to dehydration, resolved.  3. Dysphagia due to acute cerebrovascular accident, now with PEG tube placement, tolerating tube feeds.  4.  Acute hypokalemia, resolved.  5. Acute encephalopathy due to cerebrovascular accident, improved. The patient able to talk but has garbled speech.  6.  Severe peripheral vascular disease.  7.  Type 2 diabetes.  8.  Chronic kidney disease, stage III. Baseline creatinine appears around 2.  9.  Type 2 diabetes.   CODE STATUS: FULL CODE.   CONSULTATIONS: 1.  Dr. Malvin Johns: Neurology.  2.  Dr. Servando Snare, GI. 3.  Dr. Harvie Junior, Palliative. 4.  Dr. Gilda Crease, vascular.  5.  Speech therapy.  6.  Physical therapy.  PROCEDURES:  1.  30th of September, arch aortogram: Left upper extremity distal runoff, third-order catheter placement.  2.  Upper GI endoscopy done on 05/19/2013, and PEG tube placement.  3.  N.p.o.  4.  Oxygen, 1 liter per minute as needed.  5.  Glucerna 1.5 calories RTH 20 mL per hour, increased to goal of 68 mL per hour, increase as tolerated.  6.  Flush PEG tube per protocol.  7.  PT, OT.  8. TEDS knee-high bilaterally.  9. Suction oral secretions as needed.   Speech and respiratory therapy to follow at the skilled facility, if available.   DISCHARGE MEDICATIONS:  All medications to be given through PEG tube or per rectal route:  Medications at discharge:  1.  Sliding-scale insulin.  2.  Gabapentin 600 at bedtime.  3.  Docusate 100 mg b.i.d.  4.  Levimir 35 units daily.  5.  Tylenol 650 rectal suppository q. 6 p.r.n.  6.  Zestril 10 mg daily.  7.  Norvasc 10 mg  daily.  8.  Protonix suspension 40 mg daily.  9.  Atorvastatin 20 mg daily.  10.  Aspirin 325 mg daily.   LABS AT DISCHARGE: Magnesium was 1.9.   Glucose was 237, BUN 17, creatinine 2.1, sodium was 141, potassium 3.9, chloride 113. Bicarb is 21. Phosphorus 3.1.   H and H is 11.8 and 36.6. White count is 12.2, platelet count is 286. Ultrasound, carotid Doppler: A small amount of soft and calcified plaque in both carotid systems. No hemodynamically significant stenosis.   CT of the head without contrast October 2nd showed consistent with area of infarction, MCA distribution on the right, with minimal sub-falcine deviation. This finding has the appearance of subacute infarct, particularly when compared to previous study.   Lipid profile within normal limits. TSH is 0.754.  BRIEF SUMMARY OF HOSPITAL COURSE: [name] is a 59 year old African American gentleman with a history of CKD, diabetes, hypertension, who was being evaluated as an outpatient for his upper extremity pain. The patient underwent aortic arch and a left upper extremity distal runoff. Thereafter, he could not move his left upper and lower extremities. He was admitted with:   1.  Acute right MCA CVA, right upper extremity aortogram for PAD. Initial CT was normal however, the patient had severe left-sided hemiplegia with facial droop. His repeat CT showed  right MCA area of CVA, with mild sub-falcine herniation. He was by seen by Dr. Malvin Johns  His  CT stroke appears embolism in nature. The patient was started on per rectal aspirin. Echo showed EF of 60%. There was no evidence of carotid stenosis on bilateral Dopplers. The patient's speech improved some, however he still continues to have garbled speech. He was continued to be followed by Speech eval.  2.  Acute encephalopathy due to CVA: The patient is now able to talk, but does have garbled speech.  3.  Electrolyte abnormality, with hyponatremia and hyperkalemia: Resolved after treatment  with D5 water and IV calcium gluconate D50, along with IV insulin and per rectal Kayexalate.  4.  Dysphagia acute due to CVA: The patient failed swallow eval. GI consultation. Dr. Servando SnareWohl spoke with family and family voiced agreement with PEG tube placement which was placed on 05/19/2013. The patient tolerating PEG feeding.  5.  PAD, severe: On aspirin and statins.  6. Type 2 diabetes: Since the patient's PEG feedings have been started we restarted his insulin, Levimir, and sliding-scale.  7.  Chronic kidney disease, stage III: Creatinine up to 3.8; was likely due to prerenal azotemia.  Came down to 2.14 which is the patient's baseline, after IV hydration.   Patient's sister, Dorann LodgeJuanita, is the decision-maker, and she is agreeable for the patient to go to rehab. The patient will be discharged today to rehab and further care.   TIME SPENT: 40 minutes.    ____________________________ Wylie HailSona A. Allena KatzPatel, MD sap:dm D: 05/21/2013 09:02:56 ET T: 05/21/2013 10:38:13 ET JOB#: [id number]  cc: Malakye Nolden A. Allena KatzPatel, MD, <Dictator> Willow OraSONA A Kerron Sedano MD ELECTRONICALLY SIGNED 06/06/2013 15:01

## 2014-12-04 NOTE — H&P (Signed)
PATIENT NAME:  Robert [name], BOYD MR#:  [id number] DATE OF BIRTH:  1955-10-05  DATE OF ADMISSION:  12/23/2012  REFERRING PHYSICIAN:  Charlesetta Ivory, MD   PRIMARY CARE PHYSICIAN: Denton Lank, MD  CHIEF COMPLAINT: Abdominal pain, nausea and vomiting.   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 Reports his symptoms have been going on for the last 4 days. Reports he has been having decreased p.o. intake so he was not taking his insulin regularly due to decreased p.o. intake. The patient has home health who requested him to come to the ED for further evaluation. In the ED, the patient had CT of abdomen and pelvis which did show perinephric stranding suspicious for pyelonephritis.  The patient had urinalysis showing 6 white blood cells and significant for bacteria in the urine. The patient was started on IV Cipro in the ED.  Given the fact the patient was not able to tolerate p.o. intake and he had significant recurrent episodes of vomiting, hospitalist service was requested to admit the patient for IV antibiotic administration. The patient is known to have history of chronic kidney disease with baseline creatinine around 2. It was seems to be stable around the same baseline. The patient had leukocytosis at 12.7, had temperature of 99.3 in the ED.  As well, presented with uncontrolled blood pressure.  Denies any coffee-ground emesis, any diarrhea. The patient has uncontrolled blood pressure upon presentation where he was given lisinopril and Norvasc in the ED.  As well, he had uncontrolled blood pressure with his blood glucose being 413, where he did receive IV Regular insulin and sub-Q insulin as well.  PAST MEDICAL HISTORY: 1.  Diabetes mellitus type 2, insulin-requiring.   2.  History of noncompliance.  3.  Hypertension.  4.  Chronic kidney disease.  5.  Peripheral vascular disease status post left below-knee amputation.  6.  Diabetic peripheral neuropathy.  7.  Gastroesophageal reflux disease.  8.  Tobacco abuse.   PAST SURGICAL HISTORY:  Left below-knee amputation.   SOCIAL HISTORY: Chronic smoker, 1 pack per day for more than 30 years. No history of alcohol abuse. Remote history of crack cocaine in the past, nothing recent.  The patient is on disability, lives at home with some home care.   FAMILY HISTORY: Significant for diabetes mellitus in his uncle.   ALLERGIES: No known drug allergies.   HOME MEDICATIONS: 1.  Lantus 45 units sub-Q in the morning.  2.  Gabapentin 600 mg oral at bedtime.  3.  Aspirin 81 mg daily.  4.  Tramadol 50 mg every 6 hours as needed.  5.  Norvasc 5 mg oral daily.  6.  Lisinopril 5 mg oral daily.  7.  Lasix 20 mg 3 times a week.  8.  Omeprazole 20 mg oral daily.    REVIEW OF SYSTEMS:   CONSTITUTIONAL:  The patient complains of fever, fatigue, and weakness. Denies weight gain, weight loss.  EYES: Denies blurry vision, double vision, or inflammation.  ENT: Denies tinnitus, ear pain, epistaxis, discharge, or hearing loss.  RESPIRATORY: Denies cough, hemoptysis, dyspnea, asthma, or COPD.  CARDIOVASCULAR: Denies chest pain, edema, arrhythmia, palpitations, or syncope.  GASTROINTESTINAL: Complains of nausea, vomiting, and abdominal pain. Denies diarrhea, hematemesis, melena, bright red blood per rectum, or melena.  GENITOURINARY: Denies dysuria, hematuria, or renal colic.  ENDOCRINE: Denies polyuria, polydipsia, heat or cold intolerance.  HEMATOLOGY: Denies anemia, easy bruising, or bleeding diathesis.  INTEGUMENTARY: Denies acne, rash, or skin lesions.  MUSCULOSKELETAL: Denies any gout, arthritis or cramps.  NEUROLOGIC: Denies CVA, TIA, seizures, headache, dementia, ataxia, or vertigo.  PSYCHIATRIC: Denies anxiety,  insomnia, bipolar disorder, or schizophrenia.   PHYSICAL EXAMINATION: VITAL SIGNS: Temperature 99.3, pulse 69, respiratory rate 16, blood pressure 185/101, and saturating 98% on room air.  GENERAL: Well-nourished male who lies comfortable in bed, in no apparent distress.  HEENT: Head atraumatic, normocephalic. Pupils equal and reactive to light. Pink conjunctivae. Anicteric sclerae. Moist oral mucosa.  NECK: Supple. No thyromegaly. No JVD.  LUNGS:  Good air entry bilaterally. No wheezing, rales, or rhonchi.  HEART:  S1 and S2 heard. No rubs, murmurs, or gallops.  ABDOMEN: Soft, nontender, and nondistended. Bowel sounds present.  EXTREMITIES: Right lower extremity no edema. No clubbing. No cyanosis. Left lower extremity has below the knee amputation.  NEUROLOGIC: Cranial nerves grossly intact. Motor 5 out of 5. No focal deficits.  SKIN: Normal skin turgor. Warm and dry.   PERTINENT LABORATORY AND DIAGNOSTICS: Glucose 413, BUN 35, creatinine 2.11, sodium 136, potassium 5, chloride 105, CO2 25, albumin 2.2, total protein 6.3, total bili 0.3, alk phos 195, AST 34, and ALT 35. Troponin less than 0.02. TSH 1.26.  White blood cells 12.7, hemoglobin 13.4, hematocrit 41.3, and platelets 258. Urinalysis is showing 6 white blood cells, +2 bacteriuria.   EKG is showing normal sinus rhythm without significant ST or T wave changes.  CT of abdomen and pelvis without contrast significant for moderate constipation and nonspecific perinephric stranding which can be seen in renal insufficiency as well as acute etiologies such as pyelonephritis. Cholelithiasis. Extensive arthrosclerotic vascular calcification.   ASSESSMENT AND PLAN: 1.  Nausea, vomiting, and abdominal pain.  This might be related to acute pyelonephritis. As well might be related to gastroenteritis. The patient will be started on IV fluids, p.r.n. pain and nausea medication.  2.  Acute pyelonephritis. The patient will be started on IV Cipro. We  will follow on urine cultures.  3.  Diabetes mellitus, uncontrolled. This is most likely related to him not taking his insulin regularly over the last few days secondary to his nausea and vomiting. The patient will be started on insulin sliding scale, will be continued on Lantus, but will lower the dose from 45% to 35 units in the morning. We will check hemoglobin A1c. 4.  Chronic kidney disease. Appears to be at baseline, but secondary to his significant nausea and vomiting will hold his Lasix and will have him on IV fluids.  5.  Diabetic neuropathy. Continue gabapentin.  6.  Hypertension, uncontrolled. Continue home medication, lisinopril and Norvasc. Will have him on p.r.n. hydralazine as well.  7.  Gastroesophageal reflux disease. Continue on proton pump inhibitor.  8.  Tobacco abuse counseling and expressed understanding of the need to stop smoking and agreeing to that.  Counseled for 5 minutes.  Will be started on NicoDerm patch.  9.  Deep vein thrombosis prophylaxis. Sub-Q heparin.  10.  Gastrointestinal prophylaxis. On proton pump inhibitor.   CODE STATUS: FULL CODE.   TOTAL TIME SPENT ON ADMISSION AND PATIENT CARE: 55 minutes.  ____________________________ Albertine Patricia, MD dse:sb D: 12/23/2012 02:14:55 ET T: 12/23/2012 10:30:12 ET JOB#: [id number]  cc: Albertine Patricia, MD, <Dictator> Montzerrat Brunell Graciela Husbands MD ELECTRONICALLY SIGNED 12/25/2012 7:50

## 2014-12-04 NOTE — Discharge Summary (Signed)
PATIENT NAME:  Robert [name], Dominiq J MR#:  [id number] DATE OF BIRTH:  29-Oct-1955  DATE OF ADMISSION:  06/05/2013 DATE OF DISCHARGE:  06/16/2013   ADDENDUM   This is an addendum to earlier dictated interim discharge summary by Dr. Mordecai MaesSanchez on 06/11/2013.   DISCHARGE DIAGNOSES: 1.  Clostridium difficile colitis.  2.  Electrolyte imbalance.  3.  Nausea and vomiting.  4.  Aspiration pneumonia.  5.  Cerebrovascular accident history.  6.  Hypertension.  7.  Diabetes.   CONDITION ON DISCHARGE: Stable.   CODE STATUS: Full code.   MEDICATIONS ON DISCHARGE: 1.  Aspirin 325 mg enterally once a day.  2.  Atorvastatin 20 mg enterally at bedtime.  3.  Gabapentin  300 mg oral capsule 2 capsules enterally once a day at bedtime.  4.  Norvasc 10 mg enterally once a day.  5.  Protonix 40 mg oral granule enterally once a day.  6.  Metronidazole 500 mg every 8 hours for 7 days.  7.  Insulin 25 mg subcutaneous injection once a day at bedtime.  8.  Loperamide 2 mg oral capsule 4 times a day as needed for diarrhea.  9.  Metoprolol 25 mg oral 2 times a day via PEG.  SPECIAL INSTRUCTION ON DIET: Do feed Glucerna 1.5 calorie 60 mL per hour via PEG tube. The patient had aspiration to any kind of food or liquid including ice chips in mouth, so please avoid it.   FOLLOWUP: Advised to do routine followup with primary care physician, Dr. Hillery AldoSarah Patel.   For history of presenting illness, see H and P on 06/05/2013 by Dr. Angelica Ranavid Hower and interim discharge summary on the 06/11/2013 by Dr. Betha Loaobert Sanchez.   HOSPITAL COURSE: After 06/11/2013, the patient was having diarrhea and was found having C. difficile positive, so his antibiotics which he was on for aspiration pneumonia were stopped and he was started on Flagyl via PEG tube. His stools started to become formalized and became loose. His white cell count came down. He remained afebrile and stable. We again tried to give him some ice chips, as he was complaining of oral  dryness, but he had severe aspiration on diet and we recommend not to give anything orally. His tube feed, which he was feeling initially nausea and vomiting, slowly after starting the treatment he was comfortable and we upgraded the tube feeding up to 60 mL per hour, which is goal, and he was tolerating it nicely. His blood sugar level was fluctuating initially because of not having good oral intake, was remaining low but then it went high and we increased his insulin dose of Levemir to higher dose, and the patient had a few episodes of hypoglycemia so we had to cut it down again. Finally, we are discharging him on 25 units of Levemir long-acting insulin once a day. Stool for C. difficile was positive on 29th of October. Hemoglobin is ranging around 10 and 10.5, which is a chronic issue for him. Creatinine at the time of discharge is 2. On admission, he had acute renal failure. He presented with creatinine of 2.9 or 3.   TOTAL TIME SPENT ON THIS DISCHARGE: 40 minutes.   ____________________________ Hope PigeonVaibhavkumar G. Elisabeth PigeonVachhani, MD vgv:jm D: 06/16/2013 13:37:57 ET T: 06/16/2013 13:52:37 ET JOB#: [id number]  cc: Hope PigeonVaibhavkumar G. Elisabeth PigeonVachhani, MD, <Dictator> Sarah "Sallie" Allena KatzPatel, MD Altamese DillingVAIBHAVKUMAR Devaughn Savant MD ELECTRONICALLY SIGNED 06/30/2013 8:10

## 2014-12-04 NOTE — Consult Note (Signed)
Referring Physician:  Demetrios Loll :   Primary Care Physician:  Hortencia Pilar : [facility] [address] [place], [place] [address], [place] [phone]  [place] Lank Amado Nash) : [place], [address] [place], [place] [address], [place] [phone]  Reason for Consult: Admit Date: 13-May-2013  Chief Complaint: Acute stroke.  Reason for Consult: CVA   History of Present Illness: History of Present Illness:   Please note that this consultation was performed on 05/13/13 at approximately 2pm. African American man with a complex medical history including CKD, PVD, diabetes, and HTN presents today with acute stroke.  He had an angiogram of the upper extremity to evaluate for PAD and then developed LUE weakness and slurred speech.  He had a HCT that was negative for acute bleed or large ischemic lesion.  He became unable to communicate in any meaningful way.  I was called to bedside for stat neurology consult for acute stroke.  Upon entering room, patient was somewhat improved from the initial medical evaluation.  Patient was able to follow most simple commands and apraxia was noted as he would not lift his LLE to command but would occasionally spontaneously move it.  His LUE was flaccid.  He had gaze deviation to the right and would not look past midline to the left.  He had severe dysarthria and mild receptive and expressive aphasias.  I evaluated him immediately for tPA but given the angiogram catheterization that likely dislodged plaque and led to the stroke I did not see benefit to give an anticoagulant like tPA as this would do little to dislodge stroke from plaque fragment.  In addition, given the instrumentation he would be at high risk for a severe bleeding complication.  As such I recommended full dose aspirin PR stat and transition to PO if approvied for swallow by ST.  I spoke with the patient's sister who is his contact listed in his paperwork  to discuss the case and she was also in agreement that it would be best not to pursue tPA due to the reasons outlined above, it seemed that the potential risks certainly outweighed any potential benefits.   MEDICAL HISTORY: Hypertension, diabetes, CKD, PAD with left BKA, diabetic peripheral neuropathy, GERD, ongoing tobacco abuse, noncompliance.  SURGICAL HISTORY: BKA.   HISTORY: Regular smoker, smokes 1 pack a day.  Denies alcohol use or illicit drugs.  HISTORY: Diabetes.  MEDICATIONS Omeprazole 20 mg p.o. daily.  Lisinopril 5 mg p.o. daily.  Lantus 35 units subcu at bedtime.  Gabapentin 600 mg p.o. once a day at bedtime p.r.n.  Colace 100 mg p.o. b.i.d. p.r.n. for constipation.  Norvasc 5 mg p.o. daily.  NKDA.    ROS:  General denies complaints   HEENT no complaints   Lungs no complaints   Cardiac no complaints   GI no complaints   GU no complaints   Extremities no complaints   Skin no complaints   Endocrine no complaints   Psych no complaints   Past Medical/Surgical Hx:  GERD - Esophageal Reflux:   HTN:   Diabetes:   left below the knee amputation:   Home Medications: Medication Instructions Last Modified Date/Time  insulin glargine 100 units/mL subcutaneous solution 35 unit(s) subcutaneous once a day (at bedtime) 30-Sep-14 09:39  omeprazole 20 mg oral delayed release capsule 1 cap(s) orally once a day for reflux 30-Sep-14 09:39  amLODIPine 5 mg oral tablet 1 tab(s) orally once a day for blood pressure  30-Sep-14 09:39  gabapentin 600 mg oral tablet 1 tab(s) orally once a day (at bedtime), As Needed - for Pain 30-Sep-14 09:39  lisinopril 5 mg oral tablet 1 tab(s) orally once a day for blood pressure and kidney protection 30-Sep-14 09:39  docusate sodium 100 mg oral capsule 1 cap(s) orally 2 times a day, As Needed - for Constipation 30-Sep-14 09:39   [facility] Current Meds:  Sodium Chloride 0.9% w/KCl 55mq/L, 1000 ml at 100 ml/hr  fentaNYL injection, ( Sublimaze  injection )  25 to 50 mcg, IV push, every 1 - 2 minute(s) PRN for pain.  Stop After:  2 Hours  Indication: Pain, Initial dosage: 252m/kg slowly in 2551mincrements over 1-2 minutes. Total dosage: 67m40mo 100mc1mWhile in Vasc [place] Admin Window: 30 mins before or after scheduled dose]  Midazolam injection, ( Versed injection )  1 to 2 mg, IV push, Q2M PRN for signs of discomfort.  Stop After:  2 Hours  Indication: Signs of Discomfort, 0.5mg -82m5mg IV22mowly over 2 minutes. Total dosage generally less than 5mg IV.66mWhile in Vasc Lab/Specials Recovery  atorvaSTATin tablet, 20 mg Oral daily  - Indication: Hypercholesterolemia  Acetaminophen * tablet, ( Tylenol (325 mg) tablet)  650 mg Oral q4h PRN for pain or temp. greater than 100.4  - Indication: Pain/Fever  HePARin injection, 5000 unit(s), Subcutaneous, q8h  Indication: Anticoagulant, Monitor Anticoags per hospital protocol  Insulin SS -Novolog injection, Subcutaneous, FSBS before meals and at bedtime  0 units if FSBS 0-150     2 unit(s) if FSBS 151 - 200     4 unit(s) if FSBS 201 - 250     6 unit(s) if FSBS 251 - 300     8 unit(s) if FSBS 301 - 350     10 unit(s) if FSBS 351 - 400  Call MD if FSBS is greater than 400, [Waste Code: Black]  MorphINE  injection, 2 to 4 mg, IV push, q4h PRN for pain  Indication: Pain, [Med Admin Window: 30 mins before or after scheduled dose]  Ondansetron injection, ( Zofran injection )  4 mg, IV push, q4h PRN for Nausea/Vomiting  Indication: Nausea/ Vomiting  Pantoprazole tablet, 40 mg Oral q6am  - Indication: Erosive Esophagitis/ GERD  Instructions:  DO NOT CRUSH  hydrALAZINE HCl injection, ( Apresoline injection )  10 mg, IV push, q4-6h PRN for if SBP>200 or DBP >120  Indication: Hypertension/ CHF  Nursing Saline Flush, 3 to 6 ml, IV push, Q1M PRN for IV Maintenance  Aspirin Enteric Coated tablet, ( Ecotrin)  325 mg Oral daily  - Indication: Pain/Fever/Thromboembolic  Disorders/Post MI/Prophylaxis MI  Instructions:  Initiate Bleeding Precautions Protocol--DO NOT CRUSH  Allergies:  No Known Allergies:   EXAM: PHYSICAL EXAMINATION  GENERAL: Pleasant man.  Normocephalic and atraumatic.  EYES: Funduscopic exam shows normal disc size, appearance and C/D ratio without clear evidence of papilledema.  CARDIOVASCULAR: S1 and S2 sounds are within normal limits, without murmurs, gallops, or rubs.  MUSCULOSKELETAL: Apraxia noted in the LLE.  Occasional large amplitude spontaneous LLE movements but does not follow command to lift up his left leg when asked.  Uncertain if LUE weakness is from apraxia as well but no spontaneous LUE movements are identified. Bulk - Overweight Tone - Normal Pronator Drift - No drift in the RUE, LUE is flaccid. Ambulation - Gait and station are not tested due to LLE weakness/apraxia.  R/L 5/0    Shoulder abduction (deltoid/supraspinatus, axillary/suprascapular n,  C5) 5/0    Elbow flexion (biceps brachii, musculoskeletal n, C5-6) 5/0    Elbow extension (triceps, radial n, C7) 5/0    Finger adduction (interossei, ulnar n, T1)  5/4    Hip flexion (iliopsoas, L1/L2) 5/4    Knee flexion (hamstrings, sciatic n, L5/S1) 5/4    Knee extension (quadriceps, femoral n, L3/4) 5/4    Ankle dorsiflexion (tibialis anterior, deep fibular n, L4/5) 5/4    Ankle plantarflexion (gastroc, tibial n, S1)  NEUROLOGICAL: MENTAL STATUS: Patient is oriented to person, place and time.  Recent and remote memory cannot be accurately assessed at this time due to acute stroke and severe dysarthria.  Attention span and concentration cannot be accurately assessed at this time due to acute stroke and severe dysarthria.  Naming and repetition are intact.  Comprehension appears mildly decreased.  Expressive speech is slightly diminished though this appears due primarily to severe dysarthria with mild component of aphasia.  Patient's fund of knowledge cannot be  accurately assessed at this time due to acute stroke and severe dysarthria.    CRANIAL NERVES: Abnormal    CN II (left hemianopsia, eyes do not cross midline to the left, gaze preference to the right) Normal    CN III, IV, VI (extraocular muscles are intact looking to the right) Normal    CN V (facial sensation is intact bilaterally) Normal    CN VII (facial strength is intact bilaterally) Normal    CN VIII (hearing is intact bilaterally) Normal    CN IX/X (palate elevates midline, normal phonation) Abnormal    CN XI (shoulder shrug strength is diminished on the left) Normal    CN XII (tongue protrudes midline)   SENSATION: Patient does not withdraw to noxious stim in the LUE, sensation is intact in other three limbs.   REFLEXES: R/L 2+/2+    Biceps 2+/2+    Brachioradialis   2+/2+    Patellar 2+/2+    Achilles   COORDINATION/CEREBELLAR: Cannot be assessed as patient does not follow this command due to mild aphasia and severe LUE weakness/apraxia.  Lab Results:  LabObservation:  30-Sep-14 17:51   OBSERVATION Reason for Test  Routine Chem:  30-Sep-14 09:29   Glucose, Serum  51  BUN 18  Creatinine (comp)  2.68  Sodium, Serum 143  Potassium, Serum 4.4  Chloride, Serum  114  CO2, Serum 24  Calcium (Total), Serum  8.4  Anion Gap  5  Osmolality (calc) 284  eGFR (African American)  30  eGFR (Non-African American)  26 (eGFR values <65m/min/1.73 m2 may be an indication of chronic kidney disease (CKD). Calculated eGFR is useful in patients with stable renal function. The eGFR calculation will not be reliable in acutely ill patients when serum creatinine is changing rapidly. It is not useful in  patients on dialysis. The eGFR calculation may not be applicable to patients at the low and high extremes of body sizes, pregnant women, and vegetarians.)    13:56   Magnesium, Serum  1.5 (1.8-2.4 THERAPEUTIC RANGE: 4-7 mg/dL TOXIC: > 10 mg/dL  -----------------------)    14:55    Result Comment - HAND DELIVERED  - Dr. CBridgett Larssonby [facility]/30/14 1405  Result(s) reported on 13 May 2013 at 02:04PM.  Routine Coag:  30-Sep-14 13:56   Activated PTT (APTT)  36.5 (A HCT value >55% may artifactually increase the APTT. In one study, the increase was an average of 19%. Reference: "Effect on Routine and Special Coagulation Testing Values of Citrate  Anticoagulant Adjustment in Patients with High HCT Values." American Journal of Clinical Pathology 1448;185:631-497.)  Prothrombin 14.4  INR 1.1 (INR reference interval applies to patients on anticoagulant therapy. A single INR therapeutic range for coumarins is not optimal for all indications; however, the suggested range for most indications is 2.0 - 3.0. Exceptions to the INR Reference Range may include: Prosthetic heart valves, acute myocardial infarction, prevention of myocardial infarction, and combinations of aspirin and anticoagulant. The need for a higher or lower target INR must be assessed individually. Reference: The Pharmacology and Management of the Vitamin K  antagonists: the seventh ACCP Conference on Antithrombotic and Thrombolytic Therapy. [id number].8588 Sept:126 (3suppl): [id number]. A HCT value >55% may artifactually increase the PT.  In one study,  the increase was an average of 25%. Reference:  "Effect on Routine and Special Coagulation Testing Values of Citrate Anticoagulant Adjustment in Patients with High HCT Values." American Journal of Clinical Pathology 2006;126:400-405.)  Routine Hem:  30-Sep-14 13:56   WBC (CBC) 10.2  RBC (CBC) 5.32  Hemoglobin (CBC)  12.4  Hematocrit (CBC)  38.4  Platelet Count (CBC) 285  MCV  72  MCH  23.2  MCHC 32.1  RDW  15.9  Neutrophil % 46.4  Lymphocyte % 41.9  Monocyte % 6.6  Eosinophil % 3.7  Basophil % 1.4  Neutrophil # 4.7  Lymphocyte #  4.3  Monocyte # 0.7  Eosinophil # 0.4  Basophil # 0.1 (Result(s) reported on 13 May 2013 at 02:15PM.)   Radiology Results: CT:     30-Sep-14 12:50, CT Head Without Contrast  CT Head Without Contrast   REASON FOR EXAM:    CVA  COMMENTS:       PROCEDURE: CT  - CT HEAD WITHOUT CONTRAST  - May 13 2013 12:50PM     RESULT: Head CT dated the 05/13/2013    Technique: Helical 5 mm sections were obtained skull base through the   vertex. Patient has received intravenous contrast during an angiogram of   the left arm prior to the study.    Findings: Mild areas of intravascular contrast identified. There is no   gross evidence of acute hemorrhage. There is no evidence of subfalcine or   tonsillar herniation. Very mild areas of low-attenuation project within   the periventricular white matter regions. No gross evidence of subacute     or chronic territorial regions of infarction are appreciated. The pons   and cerebellum are grossly unremarkable. There is no evidence of a   depressed skull fracture. Visualized paranasal sinuses and mastoid air   cells are patent.    IMPRESSION:  No evidence of focal or acute abnormalities.  2. 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        Verified By: Mikki Santee, M.D., MD   Impression/Recommendations: Recommendations:   59 year old [place] American man with a complex medical history including CKD, PVD, diabetes, and HTN presents today with acute stroke. stat was unremarkable, no bleed seen.  Given the instrumentation during the angio, symptoms likely due to ruptured plaque fragment getting lodged in the cerebrovasculature causing acute stroke.  Likely in the right MCA distribution given the broad set of abnormal neurologic symptoms seen on exam.  Unfortunately, with a stroke from plaque fragment, there is no evidence that tPA would be effective in dissolving this blockage since tPA is designed to dissolve blood clots not plaque fragments.  In addition, given the instrumentation from earlier in the day and  the groin site of entry which  is currently having some bleeding, this patient would be at high risk for bleeding complication if he were to receive any tPA.  As such, I decided to recommend to the primary team, patient and his sister (his medical contact) via the telephone in a timely and prompt manner my recommendation against tPA.  All were in favor of deciding against trying to use tPA given the unfavorable risk to benefit ratio.  The patient was given full dose aspirin and sent to CCU for close monitoring.   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.Brain MRI/A to confirm suspected CVA and to evaluate for other structural lesions or dissections.Carotid doppler to rule out carotid stenosisTTE with bubble study to evaluate for intracardiac shunts or cardiac thrombiMonitor on telemetry to eval for afibCheck fasting lipid panel, TSH and HgbA1cCycle cardiac enzymes Cardiac monitoring as described above.Antiplatelet therapy: full dose ASA PO or PR TREATMENT:Per ATP III guidlines, given that patient has additional stroke risk factors, start or increase statin if LDL > 70 PRESSURE CONTROL:During the first several hours after stroke, control for factors such as anxiety/stress/pain. When these factors are controlled, if blood pressure remains higher than 220/110, treat with a short acting agent (IV labetalol)Allow permissive HTN for BP less than 220/110Restart home antihypertensive medications prior to discharge. PT, OT, Speech evaluationsConsult speech therapy for swallowing assessment and stroke education.Strict NPO until cleared by speech therapy.Fall PrecautionsCounseling for smoke cessation performed, but should be reiterated once his cognition has improved.DVT prophylaxis with [place] lovenox.  have reviewed the results of the most recent imaging studies, tests and labs as  outlined above and answered all related questions.  Melrose Nakayama, MD  Electronic Signatures: Anabel Bene (MD)  (Signed 01-Oct-14 00:58)  Authored: REFERRING PHYSICIAN, Primary Care Physician, Consult, History of Present Illness, Review of Systems, PAST MEDICAL/SURGICAL HISTORY, HOME MEDICATIONS, Current Medications, ALLERGIES, Physical Exam-, LAB RESULTS, RADIOLOGY RESULTS, Recommendations   Last Updated: 01-Oct-14 00:58 by Anabel Bene (MD)

## 2014-12-04 NOTE — Discharge Summary (Signed)
PATIENT NAME:  Robert [name], Robert [name] MR#:  [id number] DATE OF BIRTH:  August 23, 1955  DATE OF ADMISSION:  06/20/2013 DATE OF DISCHARGE: 06/25/2013.   The patient will be discharged to select LTAC at [facility].   REASON FOR ADMISSION: Continuous nausea and vomiting.    CURRENT DIAGNOSES AT DISCHARGE: 1. Nausea and vomiting caused by constipation.  2. Unresponsiveness, due to metabolic encephalopathy.  3. Severe fever of 103 with possible neuroleptic malignant syndrome due to the use of Reglan.  4. Reglan has been stopped. The patient has improvement after initiation of dantrolene.  5. Severe sepsis with fever, altered mental status, acute kidney injury, coagulopathy and acute lung injury.  6. Bilateral pneumonia.  7. Aspiration pneumonia and also hospital acquired pneumonia.  8. Dysphagia with continuous aspiration.  9. Tube feedings also with continuous aspiration and continues to have problems with obstruction due to constipation and increased residuals.  10. Acute on chronic kidney disease/Acute Tubular Necrosis.  11. Hyperkalemia, severe, with several episodes, now resolved.  12. History of Clostridium difficile infection on previous admission, now two different tests have been negative.  13. Coagulopathy without history of liver disease, likely secondary to sepsis, now resolved.  14. Anemia of chronic disease.  15. Relative hypotension.  16. Type 2 diabetes uncontrolled due to sepsis.  17. Hypertension now off Norvasc, continue metoprolol.  18. Peripheral artery disease. Continue aspirin.  19. History of cerebrovascular accident. Continue aspirin.  20. The patient is a FULL CODE.   CURRENT MEDICATIONS: 1. Aspirin 325 mg daily.  2. Atorvastatin 20 mg daily.  3. Metoprolol 25 mg twice daily.  4. Glucagon as needed for hypoglycemia.  5. Acetaminophen 325 mg 2 tablets every four hours as needed for fever.  6. Gabapentin 600 mg once a day at bedtime.  7. Insulin aspart protamine sliding  scale.  8. Insulin detemir 20 units twice daily, dose  needs to be further adjusted slowly as the patient has had hypoglycemic episodes in the past.  9. Albuterol ipratropium 2.5/0.5 mg every four hours as needed for shortness of breath.  10. MiraLAX 17 grams daily as schedule.  11. Dantrolene 25 mg once a day for the next three days and take two.   12. Zosyn 4.5 grams every eight hours.  13. Lactobacillus 1 capsule 2 times a day per PEG tube.  14. Protonix 40 mg every 12 hours.  15. Levofloxacin 750 mg every 48 hours.  16. Glucerna 1.5 kcal 35 mL per hour, advance to goal.   Transfer to LTAC today. Orders necessary. Bedrest as the patient is bedbound. Turn frequently every two hours, deep NT suctioning as necessary. N.p.o., as the patient has severe aspiration issues and keep the head elevated 35 to 45 degrees all the time and aspiration precautions.   IMPORTANT RESULTS: Admission labs: Creatinine was 2.79 increased up to 3.71, and now is 3.6. Potassium on admission was 5.2, second day 6.7, right now potassium is 3.9. Glucose has been elevated up to 400 when the patient was in sepsis and down to 56 while adjusting insulin.  Right now blood sugars around 200s to 270s. The patient had hyponatremia with sodium serum of 152,  now 141 at discharge. LFTs with increased alkaline phosphatase under 251, albumin level is 1.7. White count on admission was 25,000 up to 28,000 and now is 10.4. Hemoglobin is 9.4 now, admission was 13. Platelets 274.   C. diff was negative. INR 1.5, the last one on 10/10, on 10/09, 1.8.   Blood  cultures no growth. Urine culture no growth.   CT scan chest, abdomen and pelvis shows no bowel obstruction, colitis or any other focal information in abdomen and pelvis,  diffuse, severe atherosclerosis.   Foley catheter on right place. Chest CT severe bilateral pneumonia,  most confluent in the left lower lobe, secretions debris in the trachea and left mainstream bronchus sequela of  aspiration, right rib tracheal and precarinal lymphadenopathy appears to be reactive, right side aortic arch anatomic variant; PE  was negative.   HOSPITAL COURSE: Robert [name] is a very nice gentleman who was admitted here on 11/07. Prior he had a prolonged hospitalization from 06/05/2013 with discharge on 06/16/2013. The patient was sent from his nursing home due to nausea and vomiting. In the past has had multiple episodes of aspiration pneumonia and the last hospitalization was treated for C. difficile.  He presented to the hospital with multiple episodes of vomiting which he has had in the past after he gets really constipated, and his tube feedings backed up. He has chronic renal failure. He was admitted for evaluation of this nausea and vomiting. The patient was in acute renal failure, he looked dehydrated. IV fluids were given. He had clinical sepsis on admission for which he was being treated for C. diff.  There was question of active pneumonia versus aspiration pneumonia for which the patient was put on zosyn. At the moment of evaluation on the second day of admission, it did not seem like the patient had any new pneumonia, antibiotics were stopped. Then, the following day the patient had an elevation of potassium which needed to be treated, and on the third day of admission the patient had significant fever of 102, for what he was started on vancomycin and Zosyn. Urine culture was negative. Blood cultures were negative. Evaluation of chest x-ray showed worsening pneumonia for what we  broadened the coverage for healthcare acquired pneumonia as well with Levaquin and Zosyn and vancomycin. The patient had rocky course with a difficult, complex case.   As per problems we can tell 1. Unresponsiveness, became unresponsive during this admission, for which he needed to be transferred to the Critical Care Unit. At that moment, the patient had fever despite the fact that he was on triple antibiotic therapy, his  fever went up to 103 and he had also different manifestations including altered mental status, unresponsiveness, autonomic dysfunction with tachypnea, tachycardia. He was not necessarily rigid, but unfortunately this patient has no muscle mass and he had a stroke in the past that left his left side hemiparetic, for what I do not think that rigidity would not be easy to observe on this patient. Anyway, with all these things we diagnosed the patient with neuroleptic malignant syndrome. The patient was started on dantrolene and his Reglan was stopped. Reglan was started on previous hospitalization. Fever improved and the patient's mental status radically improved within the next day. As far as his severe sepsis. The patient had a fever of 103, altered mental status, acute kidney injury coagulopathy and acute lung injury which meets all the criteria for severe sepsis.   The focus was the pulmonary. We had to do a CT scan to evaluate him well and he did have worsening pneumonia. For which he was treated with Zosyn, vancomycin and Levaquin. At this moment, his vancomycin is going to be stopped, Zosyn and Levaquin will be continued. The patient needs to be on IV antibiotics for at least the next day or two until he  is fully afebrile for 48 hours at the least. cont of txwill be for consideration of the admitting physician at the [facility].  2. Acute respiratory failure related to aspiration pneumonia.  , now he is off oxygen at this moment.  3. Hypernatremia secondary the patient not getting enough hydration. Continue tube feedings. Continue free water per tube. At this moment he is on IV fluids at 125 an hour of D51/4NS  I recommend to continue these fluids for now and increase the free water per tube as necessary.  4. His blood pressure has been borderline low for what his Norvasc has been stopped, but we continue the metoprolol as the patient has significant tachycardia. This tachycardia has been improving.  5. History  of C. diff in the past. We will continue with lactobacillus now. The patient had 2 different test for C. difficile negative on this hospitalization for which metronidazole has been stopped as it has been completed.  6. Coagulopathy. No history of liver disease with secondary to sepsis now improved. Anemia of chronic kidney disease, has been stable. His diabetes has been well-controlled to his sepsis, adjusting Levemir right now.  7. He has peripheral artery disease, and CVA in the past. Continue aspirin.   The patient is discharged to [facility]  as he is improving slowly but he is very complex and needs a physician or someone who will be taking care of him and  overlooking his case daily or almost daily.   TIME SPENT: I spent about 75 minutes with this discharge/transfer.   ____________________________ [name], MD rsg:sg D: 06/25/2013 08:48:00 ET T: 06/25/2013 09:35:24 ET JOB#: [id number]  cc: [name], MD, <Dictator> Teron Blais Juanda Chance MD ELECTRONICALLY SIGNED 07/02/2013 23:09

## 2014-12-04 NOTE — Discharge Summary (Signed)
PATIENT NAME:  Robert [name], Robert [name] MR#:  [id number] DATE OF BIRTH:  [birth year]  DATE OF ADMISSION:  12/23/2012 DATE OF DISCHARGE:  12/24/2012  DISCHARGE DIAGNOSES: 1.  Escherichia coli urinary tract infection.  2.  Cocaine abuse.  3.  Dehydration.   IMAGING STUDIES: Include a CT scan of abdomen and pelvis without contrast shows cholelithiasis, perinephric stranding symmetrically on both sides which was read as nonspecific. No pyelonephritis or abscess found. Moderate constipation.   ADMITTING HISTORY AND PHYSICAL EXAM AND HOSPITAL COURSE:  Please see detailed H and P dictated by Dr. Randol KernElgergawy on 12/23/2012. In brief, a 59 year old male patient with significant past medical history of diabetes mellitus, hypertension and cocaine abuse, who presented to the hospital complaining of abdominal pain, nausea and vomiting. The patient on CAT scan of the abdomen was found to have perinephric stranding bilaterally without any hydronephrosis. No abscess and no stones and was admitted to the hospitalist service for possible pyelonephritis after his urinalysis showed possible urinary tract infection.   HOSPITAL COURSE: 1.  Urinary tract infection. The patient's urinalysis grew Escherichia coli is sensitive to third-generation cephalosporins. The patient's CT scan was nonspecific with the perinephric standing being bilateral. The patient also was found to have cocaine abuse which is causing his nausea and vomiting.  He was counseled extensively to quit. By the day of discharge, he was afebrile. White count was in the normal range, feeling better and is being discharged home to take antibiotics orally for 5 more days after discharge.   On the day of discharge he did not have any abdominal tenderness on exam.  Cardiac examination was normal.   DISCHARGE MEDICATIONS: 1.  Omeprazole 20 mg oral once a day.  2.  Tramadol 50 mg oral every 6 hours as needed for pain.  3.  Amlodipine 5 mg oral once a day.  4.  Aspirin 81  mg oral once a day.  5.  Gabapentin 600 mg oral once a day.  6.  Lisinopril 5 mg oral once a day.  7.  Lantus 50 units subcutaneous once a day.  8.  Lasix 20 mg oral 2 times a day.   10.  Cefuroxime 500 mg oral 2 times a day.  11.  Zofran 4 mg oral 3 times a day as needed for nausea and vomiting.   DISCHARGE INSTRUCTIONS: Resume home health, low-sodium carbonate-controlled diet. Activity as tolerated. Follow up with primary care physician in 1 to 2 weeks. The patient has agreed that he will not use any further cocaine as this could have caused his symptoms.   TIME SPENT: On day of discharge in discharge activity was 40 minutes.   ____________________________ Molinda BailiffSrikar R. Mellony Danziger, MD srs:ct D: 12/31/2012 13:59:47 ET T: 12/31/2012 14:25:29 ET JOB#: [id number]  cc: Robert HeathSrikar R. Neisha Hinger, MD, <Dictator> Orie FishermanSRIKAR R Hanish Laraia MD ELECTRONICALLY SIGNED 01/13/2013 23:25

## 2014-12-04 NOTE — H&P (Signed)
PATIENT NAME:  Robert [name], Robert [name] MR#:  [id number] DATE OF BIRTH:  1955-10-23  DATE OF ADMISSION:  05/13/2013  PRIMARY CARE PHYSICIAN:  Dr. Hillery Aldo.   REFERRING PHYSICIAN:  Dr. Gilda Crease.    CHIEF COMPLAINT:  Left-sided weakness and slurred speech today.   HISTORY OF PRESENT ILLNESS:  A 59 year old [place] American male with a history of hypertension, diabetes, noncompliance with CKD and PAD. The patient underwent an angiogram for upper extremity PAD, but then developed left-sided weakness and slurred speech about 50 minutes ago. The patient also has a decrease in mental status, had lots of secretion from mouth, unable to communicate. Dr. Gilda Crease requested intervention physician consult. Patient is confused, has slurred speech,  unable to provide any information. The patient underwent a CAT scan of the head, which is in negative for CVA.   PAST MEDICAL HISTORY: Hypertension, diabetes, CKD, PAD with left BKA, diabetic peripheral neuropathy, GERD, ongoing tobacco abuse, noncompliance.   SOCIAL HISTORY: From previous history, the patient smokes 1 pack a day for many years.  No alcohol drinking, no illicit drug.   PAST SURGICAL HISTORY: BKA.    FAMILY HISTORY: Diabetes.   ALLERGIES: No.   HOME MEDICATIONS 1.  Omeprazole 20 mg p.o. daily.  2.  Lisinopril 5 mg p.o. daily.  3.  Lantus 35 units subcu at bedtime.  4.  Gabapentin 600 mg p.o. once a day at bedtime p.r.n.  5.  Colace 100 mg p.o. b.i.d. p.r.n. for constipation.  6.  Norvasc 5 mg p.o. daily.   REVIEW OF SYSTEMS:  The patient has confusion and decreased mental status, unable to obtain at this time.   PHYSICAL EXAMINATION VITAL SIGNS: Temperature 97.9, blood pressure 161/95, pulse 80, O2 saturation 99% on oxygen, respirations 18.  GENERAL:  The patient is confused, lethargic, unable to communicate, unable to follow commands. The patient has some secretions from mouth.  HEENT: Pupils are round, about 2 to 3 mm in diameter, no  reaction to light. Unable to examine mouth, but no discharge from ear or nose. No epistaxis.  NECK: Supple. No JVD or carotid bruits. No lymphadenopathy. No thyromegaly.  CARDIOVASCULAR: S1, S2, regular rate, rhythm. No murmurs or gallops.  PULMONARY: Bilateral air entry. No wheezing or rales. No use of accessory muscles to breathe.  ABDOMEN: Soft. No distention or tenderness. No organomegaly. Bowel sounds present.  EXTREMITIES: No edema, clubbing or cyanosis. Left-sided weakness. Power 0 out of 5. Left-sided BKA. The patient moved right arm and lower extremities but unable to examine exactly.  Right lower extremity pedal pulses present.  NEUROLOGY: The patient has altered mental status and lethargic, unable to examine at this time.   LABORATORY AND RADIOLOGICAL DATA 1.  CAT scan of head no evidence for acute abnormality.  2.  Glucose51 , BUN 11 , creatinine 2.68, sodium 141, potassium 4.4, chloride of 111,  bicarb 26. No EKG available, will order EKG stat.  IMPRESSIONS 1.  Acute cerebrovascular accident.  2.  Encephalopathy and altered mental status due to cerebrovascular accident.  3.  Peripheral arterial disease.  4.  Hypertension.  5.  Diabetes.  6.  Chronic kidney disease.  7.  Gastroesophageal reflux disease.   PLAN OF TREATMENT 1.  The patient will be admitted to Critical Care Unit. We will start neuro checks, neurology consult stat but there is no neurology coverage today. I discussed with R.N., trying to contact  tele neurologist for possible t-PA treatment. Nurse supervisor contacted with neurologist, Dr. Malvin Johns. Dr. Malvin Johns  is kind to come here to see the patient. I discussed with Dr. Malvin JohnsPotter about the patient's condition.  2.  I ordered MRI of brain but according to MRI staff, patient cannot get an MRI today since patient cannot communicate and his family member cannot provide any past surgical history.  We will repeat CAT scan tomorrow and also follow up Dr. Daisy BlossomPotter's  recommendation.  3.  We will get an echocardiograph and carotid duplex.  4.  Since the patient is unable to take medication, we will keep n.p.o. and give IV fluid support. The patient was given aspirin 300 mg by rectal stat. We will get a swallowing study. May start Aggrenox and statin after the patient passes a swallowing study.  5.  We will give IV fluid support with normal saline and follow up BMP.  6.  For diabetes, we will start sliding scale. Check hemoglobin A1c but hold the Lantus due to n.p.o. status.  7.  We will hold p.o. hypertension medication at this time. The patient's blood pressure is 160 systolic. We will not give any IV and hypertension medication, but will give hydralazine if the patient's systolic blood pressure is more than 200.  8.  Since the patient has a lot of secretion, we will give aggressive suction. The patient may need intubation to protect airway.   Discussed the patient's critical condition with Dr. Gilda CreaseSchnier, Dr. Malvin JohnsPotter and respiratory staff and nurse supervisor. I just now discussed the patient's critical condition and plan of treatment with the patient's sister, next of kin, Isabella BowensJuanita Nine.    TIME SPENT: About 78 to 80 minutes.     ____________________________ Shaune PollackQing Devona Holmes, MD qc:cs D: 05/13/2013 14:14:00 ET T: 05/13/2013 14:32:36 ET JOB#: [id number]  cc: Shaune PollackQing Sicilia Killough, MD, <Dictator> Shaune PollackQING Brenner Visconti MD ELECTRONICALLY SIGNED 05/15/2013 15:01

## 2014-12-04 NOTE — Consult Note (Signed)
Brief Consult Note: Diagnosis: PEG consideration for dysphagia.   Patient was seen by consultant.   Consult note dictated.   Comments: [name] is an unfortunate 59 y/o black male s/p acute CVA who failed swallowing studies.  I have discussed PEG placement risks, benefits & alternatives with his sister Isabella BowensJuanita Loftin who agrees.  He has hyperkalemia today & has been treated with kaexylate @1100  & to be rechecked at 1500.  I will discuss timing of PEG placement with Dr Midge Miniumarren Wohl.  He will need abdominal binder as pt's sister states he was pulling out NGT.  Thanks for consult.  Please see full dictated note. #[id number].  Electronic Signatures: Joselyn ArrowJones, Yassmin Binegar L (NP)  (Signed 03-Oct-14 12:41)  Authored: Brief Consult Note   Last Updated: 03-Oct-14 12:41 by Joselyn ArrowJones, Messina Kosinski L (NP)

## 2014-12-05 NOTE — Discharge Summary (Signed)
PATIENT NAME:  Robert BushyOCH, [name] MR#:  [id number] DATE OF BIRTH:  June 28, 1956  DATE OF ADMISSION:  01/26/2014 DATE OF DISCHARGE:  03/10/2014  PRIMARY CARE PHYSICIAN: Nonlocal.  DISPOSITION: The patient will be discharged to rehab.   Please see interim discharge summary dictated on February 01, 2014 by Dr. Auburn BilberryShreyang Patel and please also see interim discharge summary dictated by Dr. Luberta MutterKonidena on March 02, 2014.  HOSPITAL COURSE: From that point onward, the patient has remained stable, dialysis 3 times a week as per nephrology. The patient received a full course of antibiotics during the hospital course. The main issue prior to discharge was increased secretions. I added glycopyrrolate to the scopolamine patch. Secretions have settled down. Can consider stopping the glycopyrrolate at the facility and then the scopolamine patch if secretions remain minimal.   FINAL DIAGNOSES: 1.  Acute respiratory failure status post tracheostomy, currently breathing on room air.  2.  Clinical sepsis, Citrobacter pneumonia, treated fully.  3.  End-stage renal disease. Dialysis Monday, Wednesday and Friday. Continue dialysis as outpatient. The patient has a catheter in the left groin.  4.  Diabetes, currently on sliding scale.  5.  Anemia of chronic disease. Continue ferrous sulfate and Procrit via dialysis.  6.  History of cerebrovascular accident with failed swallow evaluations in the past. Continue strict n.p.o. and tube feeding. Can consider another swallow evaluation as outpatient. The patient has failed 2 swallow evaluations in the past.  7.  Sacral decubiti. Continue Santyl ointment and dressing changes daily.   DISCHARGE MEDICATIONS: Include oxcarbazepine 300 mg PEG every 12 hours, aspirin 81 mg PEG tube once a day, famotidine 20 mg once PEG q. 24 hours, Humulin R sliding scale, scopolamine patch 1 patch topically behind ear every 3 days, Santyl 250 units/g topical ointment apply topically once a day, glycopyrrolate 1  mg PEG every 8 hours, ferrous sulfate 220 mg/5 mL 5 mL PEG tube twice a day with meals, Colace 10 mg/mL 5 mL PEG tube once a day, hydralazine 50 mg 1 tablet PEG 3 times a day, Nepro carb steady continuous 55 mL continuous with 25 mL of H2O, Pro-Stat 1 packet PEG daily at 1500, flush with 15 mL H2O prior and after meals.   TIME SPENT ON DISCHARGE: 35 minutes.   ____________________________ Herschell Dimesichard J. [name], MD rjw:sb D: 03/10/2014 09:13:54 ET T: 03/10/2014 09:47:02 ET JOB#: [id number]  cc: Herschell Dimesichard J. [name], MD, <Dictator> Salley ScarletICHARD J Harrison Paulson MD ELECTRONICALLY SIGNED 03/12/2014 15:46

## 2014-12-05 NOTE — Discharge Summary (Signed)
PATIENT NAME:  Robert [name], Robert [name] MR#:  [id number] DATE OF BIRTH:  10-Jul-1956  DATE OF ADMISSION:  01/26/2014 DATE OF DISCHARGE:    ADDENDUM:   Addendum to discharge summary done on August 4.    FINAL DIAGNOSES:   1.  Acute respiratory failure, status tracheostomy, which is now capped and he is on room air.  2.  The patient had sepsis with Acinetobacter pneumoniae, which was multi-drug resistant, which was treated. 3.  End-stage renal disease on hemodialysis Monday, Wednesday, Friday.  4.  Type 2 diabetes mellitus.  5.  History of cerebrovascular accident with failed swallow evaluation multiple times and patient is to be strict N.P.O. and continue tube feedings.  6.  Anemia of chronic disease.   DISCHARGE MEDICATIONS:  1.  Famotidine 20 mg via PEG daily.  2.  Ferrous sulfate 220 mg via PEG b.i.d.  3.  Trileptal 200 mg via PEG q. 12 hours.  4.  Aspirin 81 mg via PEG daily.  5.  Scopolamine patch 1 patch q. 3 days.  6.  Collagenase ointment to effected area for sacral pressure ulcers.   The patient needs to have the  cleaning of sacral ulcer with normal saline, apply Santyl > for  sacral wound and change the dressings daily.  7 8.  Glycopyrrolate 1 mg every 8 hours.    The patient had multiple interim summaries done by us. The patient, right now, is tolerating the hemodialysis and [facility], and he will follow up with [facility] [address] on Tuesday at 11:00 a.m. Please follow the previous discharge summaries for full details.  DISCHARGE VITAL SIGNS:  Temperature 98.7 Fahrenheit, heart rate is 93, blood pressure 110/57, saturations 97% on room air.   TIME SPENT: More than 30 minutes.    ____________________________ Katha HammingSnehalatha Kalanie Fewell, MD sk:ts D: 03/21/2014 10:43:57 ET T: 03/21/2014 12:18:20 ET JOB#: [id number]  cc: Katha HammingSnehalatha Daryn Hicks, MD, <Dictator> Katha HammingSNEHALATHA Basha Krygier MD ELECTRONICALLY SIGNED 04/07/2014 15:36

## 2014-12-05 NOTE — Op Note (Signed)
PATIENT NAME:  Robert [name], Robert [name] MR#:  [id number] DATE OF BIRGrace BushyH:  09-16-1955  DATE OF PROCEDURE:  02/19/2014  PREOPERATIVE DIAGNOSES:   1.  End-stage renal disease.  2.  Respiratory failure with recent tracheostomy.  3.  Status post left below-knee amputation.  4.  Hypertension.   POSTOPERATIVE DIAGNOSES: 1.  End-stage renal disease.  2.  Respiratory failure with recent tracheostomy.  3.  Status post left below-knee amputation.  4.  Hypertension.   PROCEDURES: 1.  Ultrasound guidance for vascular access to the left femoral vein.  2.  Fluoroscopic guidance for placement of catheter.  3. Placement of a 44 cm tip-to-cuff tunneled hemodialysis catheter via the left femoral internal jugular vein.   SURGEON:  Annice NeedyJason S. Dew, MD.  ANESTHESIA: Local with sedation.   BLOOD LOSS: Minimal.   INDICATION FOR PROCEDURE: This is a gentleman with end-stage renal disease. He has been in the hospital for several weeks. He has had a recent tracheostomy. He has a temporary dialysis catheter in his right groin. He has a central line in his right jugular. Rather than put a new PermCath after recent tracheostomy, we are placing a femoral PermCath.  Risks and benefits were discussed. Informed consent was obtained by the family.   DESCRIPTION OF THE PROCEDURE: The patient was brought to the vascular and interventional radiology suite. The patient's left groin was sterilely prepped and draped and a sterile surgical field was created. The left femoral vein was visualized with ultrasound and found to be patent. It was then accessed under direct ultrasound guidance and a permanent image was recorded. A wire was placed. After a skin nick and dilatation, the peel-away sheath was placed over the wire.   I created a counter incision about 7 cm from the access site and tunneled from the counter incision to the access site selecting a 44 cm tip to cuff tunneled hemodialysis catheter. This was tunneled from the counter  incision to the access site, then placed through the peel-away sheath. The  peel-away sheath was removed. The catheter tips were parked in the retrohepatic vena cava just below the right atrium in excellent location. The appropriate distal connectors were placed, withdrew blood well and flushed easily with heparinized saline and a concentrated heparin solution was placed and secured to the leg with 2 Prolene sutures. The access incision was closed with a single 4-0 Monocryl, and 4-0 Monocryl pursestring suture was used around the exit site. Sterile dressings were placed. The patient tolerated the procedure well and was taken to the recovery room in stable condition.    ____________________________ Annice NeedyJason S. Dew, MD jsd:ds D: 02/19/2014 16:41:58 ET T: 02/19/2014 21:42:09 ET JOB#: [id number]  cc: Annice NeedyJason S. Dew, MD, <Dictator>

## 2014-12-05 NOTE — Op Note (Signed)
PATIENT NAME:  [name], Stpehen J MR#:  [id number] DATE OF BIRTH:  May 15, 1956  DATE OF PROCEDURE:  02/02/2014  PREOPERATIVE DIAGNOSES:  1.  Ventilation-dependent tracheostomy.  2.  Prolonged intubation.   POSTOPERATIVE DIAGNOSES: 1.  Ventilation-dependent tracheostomy.  2.  Prolonged intubation.   PROCEDURE: Tracheostomy.   SURGEON: Vernie MurdersPaul Juengel, M.D.   ANESTHESIA: General.   COMPLICATIONS: None.   DESCRIPTION OF PROCEDURE: The patient was given general anesthesia by previous orotracheal tube that he had had. Once he was asleep, he was placed in the supine position with the shoulder roll to extend his neck a little bit. He had previous surgical wound in the anterior neck. The area was prepped and draped in sterile fashion.   Incision was created in the low anterior neck with excising the scar tissue of the previous trach. There was a lot of deep scar tissue as well and this was used to divide the midline. There was a thyroid isthmus that was just below the cricoid ring and this was grasped with a trach hook and pulled upwards to stabilize the trach as well. The Harmonic scalpel and blunt dissection was used for dividing the midline down to the pretracheal fascia. At this point, I found the previous opening into the tracheostomy. I was able to dilate this and this was just below the cuff. I placed a #8 Shiley DCT tube without difficulty. The cuff was inflated and oxygenation was good. The trach tube was secured around the neck using 2-0 nylon to the skin on both sides and then a sponge trach tie around the neck. A sponge dressing was placed at the trach stoma site. Blood loss was minimal. The patient tolerated the procedure well. There were no operative complications. The patient was taken directly back to the ICU in satisfactory condition.  ____________________________ Cammy CopaPaul H. Juengel, MD phj:aw D: 02/02/2014 12:50:14 ET T: 02/02/2014 13:09:27 ET JOB#: [id number]  cc: Cammy CopaPaul H. Juengel, MD,  <Dictator> Cammy CopaPAUL H JUENGEL MD ELECTRONICALLY SIGNED 02/17/2014 8:42

## 2014-12-05 NOTE — Op Note (Signed)
PATIENT NAME:  Robert [name], Robert [name] MR#:  [id number] DATE OF BIRTH:  01-10-1956  DATE OF PROCEDURE:  04/19/2014  PREOPERATIVE DIAGNOSIS: Need for intravenous access.   POSTOPERATIVE DIAGNOSIS: Need for intravenous access.  PROCEDURE PERFORMED: Right femoral triple-lumen central line.   COMPLICATIONS: None.   INDICATION: This is a 59 year old gentleman with sepsis. He has no IV access. He has a tracheostomy collar in place. He has a left femoral dialysis catheter. Consent was obtained from family. I did discuss this with the patient, and he verbalized approval.   DESCRIPTION OF PROCEDURE: At the patient's bedside in the intensive care unit, a timeout was called. The right groin was prepped and draped in the usual sterile fashion. Lidocaine 1% was used for local anesthesia. The right common femoral vein was cannulated without resistance. An 0.035 wire was then advanced. The subcutaneous tract was dilated with the dilator from the kit. A triple-lumen catheter was then inserted. All 3 ports flushed and aspirated. They were filled with the heparinized saline. The catheter was sewn into position and a sterile dressing was applied. There were no immediate complications.    ____________________________ Serafina Mitchell, MD vwb:ST D: 04/19/2014 21:08:14 ET T: 04/20/2014 01:05:01 ET JOB#: [id number]  cc: Serafina Mitchell, MD, <Dictator> Serafina Mitchell MD ELECTRONICALLY SIGNED 04/20/2014 20:44

## 2014-12-05 NOTE — Consult Note (Signed)
PATIENT NAME:  Robert [name], Robert [name] MR#:  [id number] DATE OF BIRTH:  1956/08/10  DATE OF CONSULTATION:  01/29/2014  REFERRING PHYSICIAN:  Dr. Belia HemanKasa CONSULTING PHYSICIAN:  Kyung Ruddreighton C. Vaught, MD  REASON FOR CONSULTATION: Respiratory failure with need for long-term ventilatory support.   HISTORY OF PRESENT ILLNESS: The patient is a 59 year old gentleman with history of multilobar pneumonia, admitted for respiratory failure, chronic kidney disease, history of stroke,  multidrug resistant urinary tract infection, and peripheral vascular disease. The patient has a history of respiratory failure in the past and has required tracheostomy tubes as well. The patient was intubated on 01/26/2014, with lethargy, hoarseness of breathing, and multilobar pneumonia. I was asked to evaluate for tracheostomy tube placement.   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   PAST SURGICAL HISTORY: Trach, PEG tube, and below knee amputation.   ALLERGIES: REGLAN.   CURRENT MEDICATIONS: Include Tylenol, calcium gluconate, cefepime, Colace, Zantac, subcutaneous heparin, insulin, Levaquin, Zosyn, vancomycin, erythromycin, and lorazepam.   SOCIAL HISTORY: [facility] admission[facility]. His sister is the primary Management consultantdecision maker.  He does not smoke or use alcohol currently.   FAMILY HISTORY: Diabetes.   PHYSICAL EXAMINATION:  VITAL SIGNS: Temperature is 97.1, pulse of 52, respirations 18, blood pressure systolic 104/68, pulse oximetry is 99%.  GENERAL: The patient is sedated and intubated. No acute distress.  EARS: External EACs are clear.  NOSE: Clear anteriorly. Oral cavity and oropharynx reveals endotracheal tube in place and secure.  NECK: Good tracheal landmarks with a palpable sternal notch, as well as thyroid  notch. The patient has a healed previous tracheostomy site just above the sternal notch.   BLOOD WORK: Hemoglobin is 8.1 and platelets 165,000.   IMPRESSION: History of respiratory failure, multilobar pneumonia, cerebrovascular accident, and dysphagia with recurrent aspiration pneumonia. Need for long-term ventilatory support.   PLAN: The patient is a tracheostomy tube candidate.  We will obtain consent from the sister, who is the decision maker and then plan for a tracheostomy tube early next week, given recent intubation. This will either performed by myself or Dr. Vernie MurdersPaul Juengel most likely on Monday, 02/02/2014.    ____________________________ Kyung Ruddreighton C. Vaught, MD ccv:ts D: 01/29/2014 13:44:45 ET T: 01/29/2014 16:50:41 ET JOB#: [id number]  cc: Kyung Ruddreighton C. Vaught, MD, <Dictator> Kyung RuddREIGHTON C VAUGHT MD ELECTRONICALLY SIGNED 02/04/2014 8:27

## 2014-12-05 NOTE — Consult Note (Signed)
Details:   - GI Note:  G tube position appears good. J-tube port not tested as requested.   If tube continues to leak or not flow well, will require interventional radiology to change out GJ tube.  I can only replace a G tube.   Electronic Signatures for Addendum Section:  Dow AdolphRein, Sendy Pluta (MD) (Signed Addendum 03-Jul-15 11:12)  also need to obtain the procedure report for conversion from G to GJ tube from sometime earlier this year.  I cannot ascertain where this was done.   Electronic Signatures: Dow Adolphein, Deston Bilyeu (MD)  (Signed 03-Jul-15 09:53)  Authored: Details   Last Updated: 03-Jul-15 11:12 by Dow Adolphein, Aubriana Ravelo (MD)

## 2014-12-05 NOTE — H&P (Signed)
PATIENT NAME:  Robert [name], Robert [name] MR#:  [id number] DATE OF BIRTH:  [birth year]  DATE OF ADMISSION:  01/26/2014  ADMITTING PHYSICIAN: Dr. Enid Baasadhika Marte Celani.  PRIMARY CARE PHYSICIAN: [facility].   CHIEF COMPLAINT: Breathing difficulty.   HISTORY OF PRESENT ILLNESS: Robert [name] is a 59 year old African American male with multiple past medical problems including recent admission in February 2015 for respiratory failure with pneumonia, CKD- chronic kidney disease stage III, aspiration pneumonia , history of stroke and status post PEG tube,   peripheral vascular disease , multidrug resistant urinary tract infection , and candidemia who was sent [facility], while he was still intubated in February 2015. Apparently patient has improved over there and was [facility]. Also during that hospitalization he had acute renal failure requiring Lasix drip. He was never started on hemodialysis at the time.His discharge creatinine was around 3.9.  [facility] was apparently [facility] and [facility] to [facility] staff. However, was noted to be very dyspneic today , very lethargic and periods of apnea and  EMS was called. He was brought over here , noted to have potassium of 6.8,  Chest x-ray with multilobar pneumonia , and with his mental status getting  worse and  his breathing, he was intubated . No family is at bedside and unable to get more information than that. He is being admitted for acute respiratory failure secondary to possible pneumonia.   PAST MEDICAL HISTORY:  1. History of CVA with residual dysphagia.  2. Aspiration pneumonia.  3. Peripheral Vascular disease status post left BKA. below-knee amputation.  4. CKD Chronic kidney disease stage III, at baseline.  5. Gastroesophageal reflux disease.  6. Hypertension.  7. Diabetes mellitus.  8. Recent admission for pneumonia and acute respiratory failure status post trach and reversed.  9. Multidrug resistant  UTI urinary tract infection including VRE and also candidal UTI. \ 10. Fungemia during the last hospitalization. 11. History of Clostridium difficile colitis.   PAST SURGICAL HISTORY:  1. Left leg BKA and  recent tracheostomy and reversal. 2. PEG tube placement from his stroke.   ALLERGIES TO MEDICATIONS: REGLAN.  CURRENT MEDICATIONS AT [facility]: Include:  1.  [facility]. DuoNeb 3 mL q. 4 hours p.r.n. 2. Aspirin 81 mg p.o. daily.  3. Atorvastatin 5 mg p.o. at bedtime.  4. Pepcid 20 mg p.o. daily.  5. Levemir 20 units subcutaneous twice a day.  6. Sodium chloride tablets 1 gram 3 tablets every 6six hours.  7. Trileptal 300 mg per 5 mL,  oral suspension twice a day.  8. Vitamin D3 with, 20000 international units oral capsule weekly.   SOCIAL HISTORY: [facility] . Has a sister, Jae DireJaunita, who is primary Management consultantdecision maker. patient has never married and, has no children.  bedbound currently. No smoking or alcohol use currently.   FAMILY HISTORY: Significant for diabetes.   Review of systems difficult to be obtained as patient is intubated and sedated.   PHYSICAL EXAMINATION:  VITAL SIGNS: Temperature 98 degrees Fahrenheit , pulse 98, respirations 32, blood pressure 113/60 , saturation 98% on on nonrebreather mask when he came in.    GENERAL: Critically-ill appearing male lying in bed intubated and sedated. Well-built   HEENT: Normocephalic, atraumatic. Pupils are 3 mm equal, round, reacting to light. Anicteric sclerae. Oropharynx: orally intubated. Otherwise appears clear without masses  or erythema or exudates.  NECK: Supple. No thyromegaly, or JVD.   LUNGS: The patient has coarse breath sounds especially on the right side, some expiratory wheeze,   which is scattered. No use of accessory muscles for breathing. Currently intubated and sedated.   CARDIOVASCULAR: S1, S2, regular rate and rhythm. No murmurs, rubs, or gallops.    ABDOMEN: Soft . Has a PEG tube and also has a dressing in the left lower quadrant. and normal bowel sounds.   EXTREMITIES: Right leg: . Lower extremity has no pedal edema, clubbing or cyanosis and 1+ feeble pedal dorsalis pedis pulses palpable . On  the left side,  Below knee amputation noted.   SKIN: No acne, rash , or lesions.   LYMPHATICS: No cervical lymphadenopathy.   NEUROLOGIC: The patient is sedated.  unable to do a neuro exam.   LABORATORY DATA: WBC 9.2, hemoglobin 12.7, hematocrit 42.0, platelet count 248,000.   Sodium 134, potassium 6.8, chloride 101, bicarbonate 25, BUN 69, creatinine 3.54, glucose 99 , and calcium of 9.2.   ALT 53, AST 57, alkaline phosphatase 141, total bilirubin 0.5, albumin of 2.3. First troponin 0.03.   Chest x-ray showing worsening patchy airspace process over right lung and minimally, over the left perihilar region.  Findings may be due to infection versus  asymmetric edema.  CT of the head showing no acute intracranial pathology. Old right pontine infarct , cerebral atrophy, chronic microvascular ischemic changes noted. Large right MCA territory infarct with encephalomalacia noted.  Urine negative for any infection.  And Chest x-ray post intubation showing right upper lobe right lower lobe pneumonia and some perihilar changes.  ASSESSMENT AND PLAN: This is 59 year old male with last admission in February 09/2013 with sepsis from pneumonia, vancomycin-resistant enterococci urinary tract infection, was intubated, acute renal failure , sent over to [facility] recovered, improved, now from [facility] brought in for acute respiratory failure.   1. Acute respiratory failure secondary to multilobar pneumonia, especially chest x-ray with right lower lobe, right middle lobe infiltrates and also left hilar changes. Blood cultures have been ordered. The patient is intubated and sedated. We will send him to  CCU,  get pulmonary consult for vent  management .  Continue broad-spectrum antibiotics for healthcare acquired pneumonia with vancomycin,  and cefepime , and also Levaquin at this time. Get an ABG  post intubation at this time and monitor.  2. Hyperkalemia. Not sure what the cause is . The patient is not on any ACE inhibitors or ARBs at this time. being treated and recheck later today. 3. Acute renal failure on chronic kidney disease baseline stage III;  however, his discharge creatinine in February was about  3.9 when he was going to [facility]. Not sure if he recovered at that time. Will get Nephrology consult.  Gentle hydration for sepsis and pneumonia  has been ordered. The patient was never been dialyzed during last hospitalization.  4. History of stroke, Unable to assess his mental status . Has a PEG tube for frequent aspiration pneumonia. This could also be again aspiration, pneumonia. Will get dietary  consult for initiation of  tube feedings.  5. Diabetes mellitus. Hold Levemir as he is n.p.o. for now and start on sliding scale insulin.  6. Gastrointestinal and deep vein thrombosis prophylaxis.   CODE STATUS: Full code.   Total time spent on admission: CRITICAL CARE TIME SPENT ON ADMISSION: 60 minutes.    ____________________________ Enid Baas, MD rk:lt D: 01/26/2014 20:31:37 ET T: 01/26/2014 21:49:34 ET JOB#: [id number]  cc: Enid Baas, MD, <Dictator> Enid Baas MD ELECTRONICALLY SIGNED 02/02/2014 16:49

## 2014-12-05 NOTE — Op Note (Signed)
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  PROCEDURE PERFORMED: Excisional debridement totaling 25 squared cm of sacral decubitus ulcer including skin, soft tissue, and muscle and initial application of wound VAC assisted closure device.   SURGEON: Natale LayMark Samrat Hayward, M.D.   ASSISTANTS: None.   ANESTHESIA: General endotracheal.   COUNTS:  Lap and needle count correct x2.   DESCRIPTION OF PROCEDURE: With informed consent obtained from the patient's sister, he was placed on the operating room table. General anesthesia was induced via the existing tracheostomy tube. The patient was then padded and positioned in right lateral decubitus with the help of a beanbag. Existing dressing was removed. The area was sterilely prepped and draped with Betadine solution and timeout was observed.   Utilizing a scalpel and Mayo scissors, necrotic tissue was debrided including skin, soft tissue, and muscle down to the sacral bone, hemostasis being obtained with point cautery and the application of a large sheet of Surgicel. The wound was irrigated. Black foam was then placed into the wound followed by the application of a Bioclusive film.  A second layer of Bioclusive film then sandwiched a bridging foam placed laterally on the patient's left trochanter. Negative pressure was applied with success. The patient was then returned supine and taken to the recovery room in stable and satisfactory condition by anesthesia services.     ____________________________ Redge GainerMark A. Egbert GaribaldiBird, MD mab:nr D: 04/16/2014 21:54:25 ET T: 04/16/2014 22:40:18 ET JOB#: [id number]  cc: Loraine LericheMark A. Egbert GaribaldiBird, MD, <Dictator> Raynald KempMARK A Nylene Inlow MD ELECTRONICALLY SIGNED 04/21/2014 10:19

## 2014-12-05 NOTE — Consult Note (Signed)
PATIENT NAME:  Robert [name], STUDY MR#:  [id number] DATE OF BIRTH:  11-12-1955  DATE OF CONSULTATION:  04/15/2014  REFERRING PHYSICIAN:  Belia Heman. Verdell Carmine, MD CONSULTING PHYSICIAN:  Cheral Marker. Ola Spurr, MD  REASON FOR CONSULTATION: Bacteremia and sacral decubitus ulcer.   HISTORY OF PRESENT ILLNESS: This is a 59 year old chronically quite ill gentleman with a trach and PEG as well as end-stage renal disease on hemodialysis. He recently had a prolonged hospitalization, at which point he grew multiple organisms including multi-drug-resistant VRE, fungemia, C. difficile, Acinetobacter pneumonia. He was discharged from the hospital August 8th. He is residing at a skilled nursing facility. Apparently, at dialysis he had blood cultures drawn 03/28/2014 and was growing Acinetobacter baumannii sensitive to gentamicin, imipenem, tobramycin and Bactrim. He has been getting antibiotics at dialysis, as well as getting oral clindamycin at his nursing home. He has also been on Diflucan. He was admitted after having noted to have a fever and a worsening sacral decubitus ulcer. We are consulted for further antibiotic management.   PAST MEDICAL HISTORY: 1. End-stage renal disease on hemodialysis.  2. Chronic respiratory failure with tracheostomy.  3. Recurrent aspiration pneumonia.  4. CVA.  5. Dysphagia.  6. Peripheral vascular disease status post amputation of his left lower extremity.  7. GERD.  8. Hypertension.  9. Diabetes.  10. History of C. difficile.  11. History of fungemia.  12. Prior multi-drug resistant varying infections.  13. Prior Acinetobacter pneumonia now bacteremia.   PAST SURGICAL HISTORY: Trach placement, PEG tube placement, left below the knee amputation.   FAMILY HISTORY: Not available.   SOCIAL HISTORY: Unclear. The patient lives in a nursing home.   REVIEW OF SYSTEMS: Unable to be obtained.   ALLERGIES: REGLAN.  ANTIBIOTICS SINCE ADMISSION: Include clindamycin. It is unclear  what antibiotics he is getting at his dialysis, but presumably it is gentamicin. He has also received a dose of ceftazidime.   PHYSICAL EXAMINATION: VITAL SIGNS: Temperature 99.4, pulse 88, blood pressure 112/61, respirations 18, saturation 97%.  GENERAL: He is chronically ill appearing, lying in bed, able to nod yes or no but not communicate otherwise. He has a trach in place. Respiratory status seems stable.  HEENT: Pupils are equal, round, and reactive to light. Oropharynx: He does have some mild dry oropharynx.  NECK: Supple.  HEART: Regular.  LUNGS: Coarse breath sounds bilaterally.  ABDOMEN: Has a PEG tube in place soft, nontender, nondistended. No hepatosplenomegaly.  EXTREMITIES: He has a left BKA. Right lower extremity: He has a stage I heel decubitus, but no drainage.   DATA: Basic panel on admission was consistent with end-stage renal disease. LFTs show a low albumin at 2.3, alkaline phosphatase 153, otherwise normal. White count on admission was 17.8, hemoglobin 7.6, platelets 463,000. Currently white count down to 15.3.  IMAGING: No imaging is available from this admission.  MICROBIOLOGY:  Review of pathogens since his last admission include: Acinetobacter grown as an outpatient with cultures in his chart. This was in 1 blood culture bottle; grew Acinetobacter sensitive to gentamicin, imipenem, tobramycin and Bactrim; intermediate to levofloxacin and cefepime. Last culture August 23rd. Sputum culture grew light growth of pseudomonas; light growth of MRSA. The pseudomonas was pansensitive. The MRSA was sensitive to Bactrim,  tigecycline, linezolid,vancomycin, gentamicin and clindamycin. Sputum culture also June 26th grew Acinetobacter pan-resistant except sensitive to tigecycline. It was resistant to Unasyn. Urine culture June 16th grew greater than 100,000 Candida albicans. Blood cultures June 16th one of two, grew 2 different species of  coag-negative staph and was presumed contaminant.  Sputum culture June 16th grew heavy growth Acinetobacter, light growth Klebsiella, heavy growth Candida albicans, heavy growth multidrug resistant Acinetobacter.   IMPRESSION: A 59 year old chronically ill gentleman admitted with fevers, elevated white count, decubitus ulcer and prior blood culture positive for Acinetobacter. He does not seem to be in any respiratory distress and I do not see evidence of a pneumonia this time. Presumably, that would have been his source for his Acinetobacter bacteremia. Fortunately, the bacteremia that was in his blood is not the multi-drug-resistant organism was found in his sputum and is sensitive to Bactrim as well as imipenem and gentamicin and tobramycin. I am not sure it is still an active issue as that culture was 2-1/2 weeks ago.   RECOMMENDATIONS: 1. I have put him in for chest x-ray.  2. Check blood cultures.  3. Would consult wound care for his decubitus ulcer. This may need debridement.  4. Will check an ESR and CRP.  5. Good wound care for his decubitus ulcer.  6. Thank you for the consult. I will be glad to follow with you.    ____________________________ Cheral Marker. Ola Spurr, MD dpf:lm D: 04/15/2014 16:13:19 ET T: 04/15/2014 16:49:01 ET JOB#: [id number]  cc: Cheral Marker. Ola Spurr, MD, <Dictator> Matai Carpenito Ola Spurr MD ELECTRONICALLY SIGNED 04/23/2014 10:25

## 2014-12-05 NOTE — Consult Note (Signed)
Pt examined. Full consult to follow. Some confusion about when G tube fell out. Apparently, fell out before patient was admitted. G put back in by nurse. Pt has been receiving TF here. Apparently, TF flushing well.  Last TF 2 days ago? Recommended contrast through G tube last night for placement. However, only KUB done. G site looks ok. Can try TF again at low rate. If not flushing properly, then order contrast under fluoro. Will sign off. Call back if there are issues. Thanks.  Electronic Signatures: Lutricia Feilh, Katyra Tomassetti (MD)  (Signed on 13-Feb-15 07:38)  Authored  Last Updated: 13-Feb-15 07:38 by Lutricia Feilh, Long Brimage (MD)

## 2014-12-05 NOTE — Op Note (Signed)
PATIENT NAME:  Robert [name], Robert [name] MR#:  [id number] DATE OF BIRTH:  October 16, 1955  DATE OF PROCEDURE:  02/02/2014  PREOPERATIVE DIAGNOSES: 1. Acute-on-chronic kidney failure.  2. Respiratory failure.  3. Peripheral vascular disease.  POSTOPERATIVE DIAGNOSES: 1. Acute-on-chronic kidney failure.  2. Respiratory failure.  3. Peripheral vascular disease.  PROCEDURES:  1. Ultrasound guidance for vascular access to the right femoral vein.  2. Placement of non-tunneled dialysis catheter into the right femoral vein.  SURGEON: Annice NeedyJason S. Dew, MD  ANESTHESIA: Local.   BLOOD LOSS: Minimal.   INDICATION FOR PROCEDURE: A 59 year old African-American male who is critical condition in the critical care unit. He is on the ventilator in respiratory failure and has multiple ongoing issues. He has known severe chronic kidney disease and has now progressed to need for dialysis. We have been asked to place a dialysis catheter.  DESCRIPTION OF THE PROCEDURE: The patient's right groin was sterilely prepped and draped and a sterile surgical field was created. The right femoral vein was visualized with ultrasound and found to be patent. It was then accessed under direct ultrasound guidance without difficulty with a Seldinger needle and a permanent image was recorded. A [name]-wire was placed after skin nick and dilatation. The 30-cm DuoGlide dialysis catheter was then placed over the wire and the wire was removed. All lumens withdrew blood well and flushed easily with sterile saline. It was secured to the skin with 3 nylon sutures. A sterile dressing was placed. The patient tolerated the procedure well and remained in his bed in a stable condition.     ____________________________ Annice NeedyJason S. Dew, MD jsd:lm D: 02/02/2014 16:49:24 ET T: 02/03/2014 01:28:12 ET JOB#: [id number]  cc: Annice NeedyJason S. Dew, MD, <Dictator> Annice NeedyJASON S DEW MD ELECTRONICALLY SIGNED 02/16/2014 15:47

## 2014-12-05 NOTE — Consult Note (Signed)
Brief Consult Note: Diagnosis: respiratory failure.   Patient was seen by consultant.   Consult note dictated.   Comments: Appreciate consult for 59 y/o [place] man with complicated health history requiring trachestomy/peg placement for evaluation of malfunctioning PEG tube. External Peg originally placed 11/14 by Dr Servando SnareWohl - in review this later fell out at a nursing home and was replaced- however these details are not available as to what type/how/who did it. It currently has 3 ports, one labeled jejunal, one presumable gastric, and one presumed inflation port. There was concern in a hospitalization 2/15 in which Dr Bluford Kaufmannh evaluated it with KUB/contrast and it was still functioning. Nurse report the tube was leaking signifcantly at one time, and jejunal port will not flush. Gastric port still functioning with tube feeds, which he is apparently tolerating well. On exam, site benign, tube appears secure. j-port does not flush but tube feeds infusing well without difficulty, there is no leaking. Abdomen soft, nondistended, no signs of tenderness.  Impression and plan: Partially obstructed feeding tube. Discussed with Dr Shelle Ironein: will evaluate with KUB to assess placement, with contrast injection to help assess function.  Electronic Signatures: Vevelyn PatLondon, Christiane H (NP)  (Signed 02-Jul-15 17:13)  Authored: Brief Consult Note   Last Updated: 02-Jul-15 17:13 by Keturah BarreLondon, Christiane H (NP)

## 2014-12-05 NOTE — Op Note (Signed)
PATIENT NAME:  Robert BushyOCH, Custer J MR#:  [id number] DATE OF BIRTH:  12-16-1955  DATE OF PROCEDURE:  05/02/2014  PREOPERATIVE DIAGNOSIS: Sacral decubitus ulcer with necrosis.   POSTOPERATIVE DIAGNOSIS:  Sacral  decubitus ulcer with necrosis 8 x 6 cm x 1 cm.  PROCEDURE PERFORMED:  Debridement of necrotic tissue from sacral decubitus ulcer down to sacral fascia.   SURGEON:  Sparsh Callens A. Adaja Wander, M.D.   ANESTHESIA: General.   ESTIMATED BLOOD LOSS: 5 mL.   COMPLICATIONS: None.   SPECIMENS: None.   INDICATION FOR SURGERY:  The patient is a pleasant 59 year old with history of a decubitus ulcer which upon exam was found to have significant necrotic and possibly infected tissue and was brought to the operating room for debridement.   DETAILS OF PROCEDURE: Informed consent was obtained. The patient was brought to the operating room suite. He was induced and given general anesthesia.  His abdomen was prepped and draped in a standard surgical fashion. A timeout was then performed correctly identifying the patient name, operative site, and procedure to be performed. The wound has been debrided down to healthy tissue and bleeding tissue. The total wound was 8 x 6 x 1 cm.  A Betadine soaked gauze was then placed into the wound, and sterile dressing was placed over it. The patient was then brought to the postanesthesia care unit. There were no immediate complications. Needle, sponge, and instrument counts were correct at the end of the procedure.    ____________________________ Si Raiderhristopher A. Flavio Lindroth, MD cal:nr D: 05/03/2014 08:14:07 ET T: 05/03/2014 14:31:26 ET JOB#: [id number]  cc: Cristal Deerhristopher A. Symeon Puleo, MD, <Dictator> Jarvis NewcomerHRISTOPHER A Mariely Mahr MD ELECTRONICALLY SIGNED 05/13/2014 0:58

## 2014-12-05 NOTE — Consult Note (Signed)
PATIENT NAME:  Robert [name] MR#:  [id number] DATE OF BIRTH:  07/05/1956  DATE OF CONSULTATION:  02/12/2014  REFERRING PHYSICIAN:  Dr. Konidena. CONSULTING PHYSICIAN:  Robert Jardin H. Luay Balding, NP  REASON FOR CONSULTATION:  Consult ordered by Dr. Konidena for evaluation of malfunctioning PEG.   HISTORY OF PRESENT ILLNESS: I appreciate consult for a 59-year-old African American man with complicated health history requiring tracheostomy PEG placement for evaluation of a malfunctioning PEG tube. External PEG placed on the 14th by Dr. Wohl.  In review, this later fell out at nursing home and was replaced; however, these details are not available as to what type, how, who did it. It currently has 3 ports, 1 labeled jejunum, 1 presumed to be gastric, and 1 presumed inflation port. There was concern at  hospitalization about it in which Dr. Oh evaluated the tube with KUB and contrast injection and it was still functioning well, so no further recommendations were made.  Patient's nurse today reports the tube was leaking significantly at 1 time.  The jejunal port would not flush.  Gastric port was still functioning with tube feeds, which apparently he is tolerating well.   PAST MEDICAL HISTORY: CVA with residual dysphasia and aspiration pneumonia necessitating PEG placement, PVD status post left BKA, chronic kidney stage III, GERD, hypertension, diabetes, pneumonia, acute respiratory failure status post trach, multi-drug-resistant urinary tract infection including VRE and Candida urinary tract infection fungemia last hospitalization, C. difficile colitis, and PEG placement after stroke.   ALLERGIES: REGLAN.   CURRENT MEDICATIONS: DuoNeb 3 mL q. 4 h. p.r.n., ASA 81 mg p.o. daily, atorvastatin 5 mg p.o. at bedtime, Pepcid 20 mg p.o. daily, Levemir 20 units subcutaneous b.i.d., sodium chloride tablets 3 grams q. 6 h., Trileptal 300 mg per 5 L twice a day, vitamin D3 at 2000 international units q. week.   SOCIAL  HISTORY: Resident of [facility].  Robert [name], his sister, is primary decision maker. No current alcohol or EtOH.   FAMILY HISTORY: Significant for diabetes.   REVIEW OF SYSTEMS: Unable to obtain as patient is trached and has a PEG tube.  He does have a complicated health history.  Appreciate a multidisciplinary note.  MOST RECENT LABORATORIES: Glucose 162, BUN 59, creatinine 2.27, sodium 142, potassium 3.7, GFR 36, calcium 7.8, phosphorus 4.3, albumin 1.4. WBC 13.6, hemoglobin 9.5, hematocrit 30.7, platelet count 399,000.   PHYSICAL EXAMINATION:  VITAL SIGNS:  Most recent vital signs: Temperature 97.7, pulse 98, respiratory rate 19, blood pressure 159/75, oxygen saturation of 97% on room air via his trach.  GENERAL: Comfortable -appearing man resting in bed.  He tracks with eyes at times, but does not attempt to communicate.  HEENT: Normocephalic, atraumatic. Sclerae clear.  Trach secure, midline anterior neck.   RESPIRATORY:  Respiratory rate regular, unlabored. Breath sounds somewhat coarse.  CARDIOVASCULAR: Regular rate, mild generalized edema.  ABDOMEN: Soft bowel sounds. Feeding tube secure and patent via the gastric port, nondistended. No apparent tenderness. No guarding, rigidity, peritoneal signs.  SKIN: Warm, dry, and pink. Bandage to right heel intact, somewhat loose. EXTREMITIES: Some contractures.  Noticed some tremors to the right hand at times, otherwise, no spontaneous purposeful movement at this time.  PSYCHIATRIC: Unable to assess due to his condition.  NEUROLOGIC: Tracks with eyes at times.  Does not follow commands.  Does not respond to questions. Pupils equal and reactive, 3 mm.   IMPRESSION AND PLAN: Partially occluded feeding tube discussed with Dr. Ryan. We will evaluate with a KUB   in contrast to assess placement and function.   Thank you very much for this consult.   These services were provided by Robert Dill, MSN, NPC, in collaboration with Robert [name],  M.D., with whom I have discussed this patient in full.    ____________________________ Robert Paris H. Yennifer Segovia, NP chl:ts D: 02/12/2014 17:21:07 ET T: 02/12/2014 18:19:56 ET JOB#: [id number]  cc: Robert Passarella H. Quetzali Heinle, NP, <Dictator> Robert [name] 02/13/2014 7:51 

## 2014-12-05 NOTE — Consult Note (Signed)
   Comments   I spoke with pt's sister, Dorann LodgeJuanita, via phone. We discussed pt's current medical status including his poor prognosis. Sister is in agreement with continued aggressive care including intubation and dialysis if needed. She continues to request full code. She says family has always felt "whatever it takes". I explained per CM that [facility] if he was vent dependent and requiring dialysis and she said she hoped that would not happen but reiterated that family wanted everything done although she admited that would not be the life she would want for herself. We also talked about tacheostomy if patient was ventilator dependent and she said that family would consent to this if needed. All questions answered.   Electronic Signatures: Borders, Daryl EasternJoshua R (NP)  (Signed 12-Feb-15 16:04)  Authored: Palliative Care   Last Updated: 12-Feb-15 16:04 by Malachy MoanBorders, Joshua R (NP)

## 2014-12-05 NOTE — Consult Note (Signed)
Asked to evaluate G tube. G tube fell out prior to admission. Placed back in by a nursing staff. Have radiology inject contast through G tube and check under fluoro to see if contrast going into stomach. This can be done in AM. If  G tube is in wrong place, call us back. Thanks.  Electronic Signatures: Lutricia Feilh, Oziel Beitler (MD)  (Signed on 12-Feb-15 19:37)  Authored  Last Updated: 12-Feb-15 19:37 by Lutricia Feilh, Nitisha Civello (MD)

## 2014-12-05 NOTE — Op Note (Signed)
PATIENT NAME:  Robert [name], [name] MR#:  [id number] DATE OF BIRTH:  11/07/1955  DATE OF PROCEDURE:  02/17/2014  PREOPERATIVE DIAGNOSES:  1.  Complication arteriovenous dialysis device.  2.  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  3.  End-stage renal disease requiring hemodialysis.  4.  Hypertension.   POSTOPERATIVE DIAGNOSES: 1.  Complication arteriovenous dialysis device.  2.  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  3.  End-stage renal disease requiring hemodialysis.  4.  Hypertension.   PROCEDURES PERFORMED:  1.  Contrast injection of a left forearm arteriovenous graft.  2.  Attempted thrombectomy, both mechanical as well as infusion using tPA and Trerotola device, as well as the AngioJet.  3.  Angioplasty of the arteriovenous graft, venous portion.  4.  Embolectomy using a Fogarty catheter of the left brachial artery.  5.  Placement of right internal jugular cuffed tunneled dialysis catheter with ultrasound and fluoroscopic guidance.   SURGEON:  Renford DillsGregory G. Schnier, M.D.   SEDATION: Versed 5 mg plus fentanyl 200 mcg administered IV. Continuous ECG, pulse oximetry, and cardiopulmonary monitoring is performed throughout the entire procedure by the interventional radiology nurse. Total sedation time is 1 hour 40 minutes.   ACCESS:  1.  A 6 French sheath, antegrade direction, AV loop graft.  2.  A 6 French sheath, retrograde direction, AV loop graft.   FLUOROSCOPY TIME: 19 minutes.   CONTRAST USED: Isovue 55 mL.   INDICATIONS: Robert [name] is a 59 year old gentleman who presents with thrombosis of his AV graft.  Risks and benefits were reviewed. All questions are answered. The patient agrees to proceed.   DESCRIPTION OF PROCEDURE: The patient is taken to special procedures and placed in the supine position  After adequate sedation is achieved,  he is positioned with his left arm extended, palm upward. The left arm is prepped and draped in a sterile fashion.  Appropriate timeout is called.   Lidocaine 1% is infiltrated in the soft tissues over the graft in both the antegrade and retrograde directions on the opposite sides of the loop graft, and a micropuncture needle is used to access the graft.  Microwire followed by micro sheath, J-wire followed by a 6 French sheath.   Glidewire and a Kumpe catheter then advanced through the venous portion and up to the axillary vein where hand injection of contrast demonstrates the axillary vein and central veins are patent. Heparin 4000 units is given and a Trerotola device went on the field. Multiple passes are made along the venous path passed that and then made along the arterial patent. There is still poor flow and again, multiple passes are made. There appears to be significant narrowing along the looped portion and subsequently an 8 x 6 balloon is used to angioplasty, beginning from the arterial anastomosis and extending it all the way back to the sheath. This is then done in the opposite direction. A 6 x 4 balloon is used to treat the actual arterial anastomosis. More runs with the Trerotola are then made. There is still poor flow with a significant amount of thrombus noted. TPA is then reconstituted and infused through the graft. It is allowed to dwell for 20 minutes. Unfortunately after this 20 minutes, there is not that much improvement. The AngioJet is then used to aspirate and morcellate thrombus throughout the system, but again, there is still very poor flow with several areas showing significant thrombus retention. At this point in time, it was also identified that there is now  a thrombus/embolus within the brachial artery, actually several centimeters above the anastomosis in an area that had not been manipulated.  This raised concerns over could this possibly be heparin antibody and we are just having persistent rethrombosis and in vitro thrombosis. The area within the brachial artery was subsequently treated  using a Fogarty over the wire balloon catheter, passing it first across the antecubital fossa and into the graft and then renegotiating the wire so that the wire went down into the radial artery and passing the balloon from the distal brachial artery back, pulling it back into the graft. Follow-up angiography through a Kumpe catheter demonstrated that there was complete removal of all thrombotic material within the brachial artery; however, again, there was very poor or sluggish flow through the graft itself and significant areas of retention. Several more passes with the Trerotola were made, but this did not appear to improve anything and it was, therefore, elected to abandon this graft and place a tunnel catheter. Both sheaths were removed after pursestring sutures of 4-0 Monocryl were placed.   The right neck was then prepped and draped in a sterile fashion. Ultrasound was placed in a sterile sleeve. Jugular vein was identified. It was echolucent and compressible indicating patency. Image is recorded. Lidocaine 1% was infiltrated in the soft tissues and subsequently a micropuncture needle was inserted followed by Microwire followed by micro sheath, J-wire followed by dilators over the J-wire and then the dilator and peel-away sheath. Wire and peel-away sheath were removed and a 19 cm tip to cuff Cannon catheter was advanced through the peel-away sheath. The peel-away sheath was removed. Catheter was then adjusted for proper tip position approximated to the chest wall. A small incision was made and the tunneling device was passed subcutaneously. Tract was dilated and then the catheter was pulled subcutaneously. Catheter was then transected and the hub assembly connected. Both lumens aspirate and flush easily and they were packed with 5000 units of heparin per lumen. Under fluoroscopy, the tips are in excellent position and has a smooth contour. Sterile dressing was applied. Neck counterincision was closed with  4-0 Monocryl subcuticular and then Dermabond catheter was secured to the chest wall with 0 silk.   INTERPRETATION: Initial views demonstrate that there was thrombus throughout the system in the left arm AV graft, which also has had multiple interventions and stents extending up to, but not into the axillary vein. Axillary vein was patent, as was the central veins. The brachial artery was patent with three-vessel runoff.   After multiple manipulations, the embolism/thrombus was noted in the brachial artery.  This was cleared with the balloon catheter over the wire; however, there was never complete resolution of the thrombus throughout the AV system and, therefore, we selected to put in the catheter as noted above.     ____________________________ Renford Dills, MD ggs:ts D: 02/17/2014 15:03:28 ET T: 02/17/2014 17:45:30 ET JOB#: [id number]  cc: Renford Dills, MD, <Dictator> Munsoor Lizabeth Leyden, MD Renford Dills MD ELECTRONICALLY SIGNED 02/18/2014 17:17

## 2014-12-05 NOTE — Discharge Summary (Signed)
PATIENT NAME:  Robert [name], Robert [name] MR#:  [id number] DATE OF BIRTH:  12/31/1955  DATE OF ADMISSION:  09/22/2013 DATE OF DISCHARGE:  09/29/2013  ADMITTING PHYSICIAN:  Dr. Luberta MutterKonidena.  DISCHARGING PHYSICIAN:  Dr. Enid Baasadhika Kyandre Okray.  PRIMARY CARE PHYSICIAN: Dr. Arsenio LoaderSara Patel.  CONSULTATIONS IN THE HOSPITAL:  1.  Pulmonary critical care consultation by Dr. Belia HemanKasa.  2.  ID consultation by Dr. Sampson GoonFitzgerald.    3.  Neurology consultation by Dr. Theora MasterZachary Potter.  4.  Nephrology consultation by Dr. Thedore MinsSingh.  5.  Palliative care consultation by Dr. Harriett SineNancy Phifer.   DISCHARGE DIAGNOSES: 1.  Acute respiratory failure, intubated and on vent.  2.  Metabolic encephalopathy.  3.  Sepsis.  4.  Vancomycin-resistant enterococcal urinary tract infection.  5.  Pneumonia.  6.  Hypernatremia.  7.  Acute renal failure.  8.  Chronic kidney disease stage III.  9.  Aspiration pneumonia.  10.  Elevated troponins, likely demand ischemia.  11.  Diabetes mellitus.  12.  History of cerebrovascular accident with dysphagia and status post percutaneous endoscopic gastrostomy tube.  13.  History of peripheral vascular disease status post left below-knee amputation.  14.  Candida urinary tract infection.  DISCHARGE MEDICATIONS: 1.  Quetiapine 25 mg via G-tube once a day at bedtime.  2.  Heparin subQ 5000 every 8 hours.  3.  Gabapentin 600 mg once a day at bedtime.  4.  Gabapentin 300 mg twice a day.  5.  Aspirin 81 mg p.o. daily.  6.  Famotidine 20 mg p.o. q.24 hours.  7.  Tylenol 650 mg q.4 hours p.r.n. for pain or fever.  8.  Insulin drip, continuous and adjust per sugars.  9.  Levophed drip currently at 12 mcg/hour. 10.  Linezolid 600 mg IV q.12 hours.  11.  Propofol drip for sedation.  12.  Meropenem 500 mg IV q.12 hours. 13.  Micafungin 100 mg IV once a day for 5 more days.   DIET:   1.  The patient is on tube feeds with Vital 1.5 at 35 mL/hour and 15 mL free water flush every hour.  2. Pro-Stat 1 packet t.i.d. with  50 mL water flush.   ACTIVITY: The patient is bedbound as on vent.   DISCHARGE FOLLOWUP INSTRUCTIONS:   Follow up with physician in the [facility].  LABORATORY DATA AND IMAGING STUDIES: Prior to discharge, WBC 15.3, hemoglobin 8.5, hematocrit 26.7, platelet count 411.   Sodium 125, potassium 3.8, chloride 89, bicarb 25, BUN 83, creatinine 3.93, glucose 224 and calcium of 7.6. Magnesium 1.9. Urine cultures were growing greater than 100,000 colonies of Candida glabrata.   BRIEF HOSPITAL COURSE:   [name] is a 59 year old African American male with multiple medical problems including prior stroke with  residual dysphagia status post PEG tube, CKD stage III and peripheral vascular disease status post left leg BKA, presents to the hospital from skilled nursing facility secondary to altered mental status and diarrhea. The patient was also noted to be septic secondary to pneumonia, hyponatremic, acute renal failure. The patient went into acute respiratory distress and got intubated on 09/25/2013.  1.  Sepsis secondary to pneumonia. Could be aspiration. The patient was started on broad-spectrum antibiotics. He was having spiking fevers during initial hospital course. He was also seen by ID. Currently, antibiotics are narrowed down to meropenem. He has been on antibiotics since 09/22/2013, and has finished 8 days of antibiotics, so we will continue for 3 more days if he remains afebrile. 2.  He also  had urine cultures growing VRE, was on linezolid and also the most latest urine cultures growing Candida, which could be contaminant, but since the colony size is significant, he is started on micafungin, The linezolid was started on 09/26/2013, and would continue a total 7 day course, until 10/03/2013.  3.  Acute respiratory failure  secondary to pneumonia, intubated, has been on vent 09/25/2013. The patient has been sedated with propofol. He has been on minimal vent settings with PEEP of 5 and FiO2 of 35% for the last  3 day, unable to wake him up likely secondary to metabolic encephalopathy from his sepsis, and he might benefit from getting a trach; not sure if he can be weaned off the vent, which is why he will be going to the long-term acute care facility at this time.  4.  Acute renal failure on CKD stage III.  Creatinine got worse to the point of 6.11 with minimal urine output and with anasarca-  was started on Lasix drip. His urine output improved, and currently creatinine is at 3.93, and he is making more than 2 liters of urine per day.  He did not require any dialysis during the hospitalization. His sodium is still on the lower side. He is started on normal saline at 75 mL per hour and has been monitored. The Lasix drip has been discontinued due to improvement in urine output and also creatinine at this point. 5.  History of peripheral vascular disease status post left BKA, neuropathy changes and he is on gabapentin for the same.  6.  Diabetes mellitus with hyperglycemia while acutely sick. He was taking Levemir as an outpatient, and currently on insulin drip per CCU protocol while in here.   The patient's prognosis is very poor overall.  He was followed with palliative care while in the hospital. Sister is power of attorney, as the patient does not have children, and according to the sister, the patient is a FULL CODE for now.   [facility]  TOTAL TIME SPENT ON DISCHARGE:  40 minutes.  ____________________________ Enid Baas, MD rk:dmm D: 09/29/2013 11:43:00 ET T: 09/29/2013 12:44:29 ET JOB#: [id number]  cc: Enid Baas, MD, <Dictator> Enid Baas MD ELECTRONICALLY SIGNED 09/30/2013 15:07

## 2014-12-05 NOTE — Discharge Summary (Signed)
PATIENT NAME:  Robert [name], Robert [name] MR#:  [id number] DATE OF BIRTH:  October 27, 1955  DATE OF ADMISSION:  04/14/2014 DATE OF DISCHARGE:  05/09/2014  Interim discharge summaries were done by Dr. Verdell Carmine and also by Dr. Vianne Bulls on 05/09/2014. I have seen this patient only on September 26th during the entire hospital course.   DISCHARGE DIAGNOSES:  1.  Sepsis secondary to infectious sacral decubiti, aspiration pneumonia with Escherichia coli, Acinetobacter and extended spectrum beta lactamase.  2.  History of multidrug-resistant organisms.  3.  End-stage renal disease, on hemodialysis.  4.  Necrotic sacral decubitus, status post debridement.   SECONDARY DISCHARGE DIAGNOSES: Hypertension, chronic pain, gastroesophageal reflux disease, chronic anemia.   BRIEF HISTORY AND HOSPITAL COURSE: The patient is a 59 year old male with multiple medical problems sent over from skilled nursing home for infected sacral decubiti and fever. The patient had positive blood cultures, found to have infectious sacral decubiti sensitive to tigecycline only. Debridement of sacral decubiti was done on 04/16/2014 and the patient was continued on tigecycline. The patient was seen and followed up by Dr. Ola Spurr, infectious disease. Sputum was positive for ESBL, Escherichia coli, Klebsiella and Acinetobacter. The patient also had bronchoscopy by Dr. Mortimer Fries. The sputum has revealed Acinetobacter and ESBL. During the entire hospital course, the patient's condition is not improved. During my examination he was still lethargic.   Palliative care was consulted who met the family members and the patient's code status was changed to DNR with comfort care on September 25th at night. The patient was admitted to hospice home and was discharged to hospice home on September 26th.   CONSULTATIONS: Infectious disease, Cheral Marker. Ola Spurr, MD; pulmonology, Mariane Duval, MD; and palliative care, Efraim Kaufmann, MD.  PROCEDURES: Bronchoscopy and  debridement of sacral decubiti ulcers on 04/16/2014.  CODE STATUS: DNR with comfort care.  MEDICATIONS: Morphine 20 mg/mL, 0.5 mL p.o. or via PEG tube every 1 to 2 hours as needed, lorazepam 0.5 mg 1-2 tablets orally or sublingually or through PEG every 2-4 hours as needed for agitation and anxiety, Zofran ODT 4 mg 1 tablet p.o. every 6 hours as needed for nausea and vomiting.   ACTIVITY: As tolerated.   OXYGEN: Continuously a tracheostomy collar at FiO2 of 30%.  DIET: As tolerated.  LABORATORY DATA AND IMAGING STUDIES: Sputum through bronchoscopy on 05/06/2014 has revealed a moderate growth of Escherichia coli, moderate growth of Acinetobacter, another organism is Escherichia coli baumannii. Organism 2 Acinetobacter is multidrug resistant. Phosphorus was at 7.1.   TOTAL TIME SPENT: Evaluating the patient, discharge coordination and reviewing the old charts took me approximately 45 minutes.    ____________________________ Nicholes Mango, MD ag:TT D: 05/13/2014 10:46:48 ET T: 05/13/2014 14:31:41 ET JOB#: [id number]  cc: Nicholes Mango, MD, <Dictator> Nicholes Mango MD ELECTRONICALLY SIGNED 05/14/2014 11:31

## 2014-12-05 NOTE — Op Note (Signed)
PATIENT NAME:  Robert [name], Robert [name] MR#:  [id number] DATE OF BIRTH:  February 18, 1956  DATE OF PROCEDURE:  09/25/2013  PREOPERATIVE DIAGNOSES:  1.  Septic shock.  2.  Acute respiratory failure.   POSTOPERATIVE DIAGNOSES:  1.  Septic shock.  2.  Acute respiratory failure.   PROCEDURES:  1. Ultrasound guidance for vascular access to right brachial vein.  2. Fluoroscopic guidance for placement of catheter.  3. Insertion of peripherally inserted central venous catheter, double lumen, right arm.  SURGEON: Festus BarrenJason Jhania Etherington, MD  ANESTHESIA: Local.   ESTIMATED BLOOD LOSS: Minimal.   INDICATION FOR PROCEDURE: A 59 year old individual who is very ill in the critical care unit with respiratory failure and sepsis. We are asked to place a PICC line.   DESCRIPTION OF PROCEDURE: The patient's right arm was sterilely prepped and draped, and a sterile surgical field was created. The right brachial vein was accessed under direct ultrasound guidance without difficulty with a micropuncture needle and permanent image was recorded. 0.018 wire was then placed into the superior vena cava. Peel-away sheath was placed over the wire. A single lumen peripherally inserted central venous catheter was then placed over the wire and the wire and peel-away sheath were removed. The catheter tip was placed into the superior vena cava and was secured at the skin at 30 cm with a sterile dressing. The catheter withdrew blood well and flushed easily with heparinized saline. The patient tolerated procedure well.   ____________________________ Annice NeedyJason S. Trudy Kory, MD jsd:np D: 09/29/2013 14:13:00 ET T: 09/29/2013 14:49:57 ET JOB#: [id number]  cc: Annice NeedyJason S. Joelee Snoke, MD, <Dictator> Annice NeedyJASON S Calvyn Kurtzman MD ELECTRONICALLY SIGNED 10/01/2013 10:52

## 2014-12-05 NOTE — Op Note (Signed)
PATIENT NAME:  Robert [name], Robert [name] MR#:  [id number] DATE OF BIRTH:  07/10/1956  DATE OF PROCEDURE:  02/17/2014  PREOPERATIVE DIAGNOSES:  1.  Complication arteriovenous dialysis device.  2.  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  3.  End-stage renal disease requiring hemodialysis.  4.  Hypertension.   POSTOPERATIVE DIAGNOSES: 1.  Complication arteriovenous dialysis device.  2.  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  3.  End-stage renal disease requiring hemodialysis.  4.  Hypertension.   PROCEDURES PERFORMED:  1.  Contrast injection of a left forearm arteriovenous graft.  2.  Attempted thrombectomy, both mechanical as well as infusion using tPA and Trerotola device, as well as the AngioJet.  3.  Angioplasty of the arteriovenous graft, venous portion.  4.  Embolectomy using a Fogarty catheter of the left brachial artery.  5.  Placement of right internal jugular cuffed tunneled dialysis catheter with ultrasound and fluoroscopic guidance.   SURGEON:  Robert [name], M.D.   SEDATION: Versed 5 mg plus fentanyl 200 mcg administered IV. Continuous ECG, pulse oximetry, and cardiopulmonary monitoring is performed throughout the entire procedure by the interventional radiology nurse. Total sedation time is 1 hour 40 minutes.   ACCESS:  1.  A 6 French sheath, antegrade direction, AV loop graft.  2.  A 6 French sheath, retrograde direction, AV loop graft.   FLUOROSCOPY TIME: 19 minutes.   CONTRAST USED: Isovue 55 mL.   INDICATIONS: Robert [name] is a 58-year-old gentleman who presents with thrombosis of his AV graft.  Risks and benefits were reviewed. All questions are answered. The patient agrees to proceed.   DESCRIPTION OF PROCEDURE: The patient is taken to special procedures and placed in the supine position  After adequate sedation is achieved,  he is positioned with his left arm extended, palm upward. The left arm is prepped and draped in a sterile fashion.  Appropriate timeout is called.   Lidocaine 1% is infiltrated in the soft tissues over the graft in both the antegrade and retrograde directions on the opposite sides of the loop graft, and a micropuncture needle is used to access the graft.  Microwire followed by micro sheath, [name]-wire followed by a 6 French sheath.   Glidewire and a Kumpe catheter then advanced through the venous portion and up to the axillary vein where hand injection of contrast demonstrates the axillary vein and central veins are patent. Heparin 4000 units is given and a Trerotola device went on the field. Multiple passes are made along the venous path passed that and then made along the arterial patent. There is still poor flow and again, multiple passes are made. There appears to be significant narrowing along the looped portion and subsequently an 8 x 6 balloon is used to angioplasty, beginning from the arterial anastomosis and extending it all the way back to the sheath. This is then done in the opposite direction. A 6 x 4 balloon is used to treat the actual arterial anastomosis. More runs with the Trerotola are then made. There is still poor flow with a significant amount of thrombus noted. TPA is then reconstituted and infused through the graft. It is allowed to dwell for 20 minutes. Unfortunately after this 20 minutes, there is not that much improvement. The AngioJet is then used to aspirate and morcellate thrombus throughout the system, but again, there is still very poor flow with several areas showing significant thrombus retention. At this point in time, it was also identified that there is now   a thrombus/embolus within the brachial artery, actually several centimeters above the anastomosis in an area that had not been manipulated.  This raised concerns over could this possibly be heparin antibody and we are just having persistent rethrombosis and in vitro thrombosis. The area within the brachial artery was subsequently treated  using a Fogarty over the wire balloon catheter, passing it first across the antecubital fossa and into the graft and then renegotiating the wire so that the wire went down into the radial artery and passing the balloon from the distal brachial artery back, pulling it back into the graft. Follow-up angiography through a Kumpe catheter demonstrated that there was complete removal of all thrombotic material within the brachial artery; however, again, there was very poor or sluggish flow through the graft itself and significant areas of retention. Several more passes with the Trerotola were made, but this did not appear to improve anything and it was, therefore, elected to abandon this graft and place a tunnel catheter. Both sheaths were removed after pursestring sutures of 4-0 Monocryl were placed.   The right neck was then prepped and draped in a sterile fashion. Ultrasound was placed in a sterile sleeve. Jugular vein was identified. It was echolucent and compressible indicating patency. Image is recorded. Lidocaine 1% was infiltrated in the soft tissues and subsequently a micropuncture needle was inserted followed by Microwire followed by micro sheath, [name]-wire followed by dilators over the [name]-wire and then the dilator and peel-away sheath. Wire and peel-away sheath were removed and a 19 cm tip to cuff Cannon catheter was advanced through the peel-away sheath. The peel-away sheath was removed. Catheter was then adjusted for proper tip position approximated to the chest wall. A small incision was made and the tunneling device was passed subcutaneously. Tract was dilated and then the catheter was pulled subcutaneously. Catheter was then transected and the hub assembly connected. Both lumens aspirate and flush easily and they were packed with 5000 units of heparin per lumen. Under fluoroscopy, the tips are in excellent position and has a smooth contour. Sterile dressing was applied. Neck counterincision was closed with  4-0 Monocryl subcuticular and then Dermabond catheter was secured to the chest wall with 0 silk.   INTERPRETATION: Initial views demonstrate that there was thrombus throughout the system in the left arm AV graft, which also has had multiple interventions and stents extending up to, but not into the axillary vein. Axillary vein was patent, as was the central veins. The brachial artery was patent with three-vessel runoff.   After multiple manipulations, the embolism/thrombus was noted in the brachial artery.  This was cleared with the balloon catheter over the wire; however, there was never complete resolution of the thrombus throughout the AV system and, therefore, we selected to put in the catheter as noted above.     ____________________________ Robert G. Schnier, MD ggs:ts D: 02/17/2014 15:03:28 ET T: 02/17/2014 17:45:30 ET JOB#: [id number]  cc: Robert G. Schnier, MD, <Dictator> Munsoor N. Lateef, MD Robert G SCHNIER MD ELECTRONICALLY SIGNED 02/18/2014 17:17 

## 2014-12-05 NOTE — Discharge Summary (Signed)
PATIENT NAME:  Robert [name], Robert [name] MR#:  [id number] DATE OF BIRTH:  1956-05-31  DATE OF ADMISSION:  01/26/2014 DATE OF DISCHARGE:  03/11/2014   Addendum  No change in status from yesterday's discharge summary. No changes in medications.   ____________________________ [name]. [name], MD rjw:lb D: 03/11/2014 08:55:32 ET T: 03/11/2014 08:58:23 ET JOB#: [id number]  cc: [name]. [name], MD, <Dictator> Salley ScarletICHARD [name] Aerica Rincon MD ELECTRONICALLY SIGNED 03/12/2014 15:46

## 2014-12-05 NOTE — Consult Note (Signed)
Discussed with radiology about doing x-ray with contrast with G tube. Apparently, this was already done on Tues AFTER G tube was reinserted. G tube was in correct position then. Therefore, there is no need to repeat study. As I mentioned before, ok to start TF and see what happens. Thanks.  Electronic Signatures: Lutricia Feilh, Sharon Rubis (MD)  (Signed on 13-Feb-15 09:37)  Authored  Last Updated: 13-Feb-15 09:37 by Lutricia Feilh, Stewart Pimenta (MD)

## 2014-12-05 NOTE — Consult Note (Signed)
Details:   - GI Note:  I have seen and examined Mr Robert [name] and agree with Christiane's a/p.  It appears his G tube was replaced with a GJ tube since February of this year.  I don't know why or where this was done.  Apparently his tube is leaking and the j port is not working well.  The site looks good.   Recs: - KUB with contrast through g and j port to confirm placement and see if they are flowing.  - I cannot replace a GJ tube.  I could only replace it with a G tube.  If needs replacement to keep as GJ, will need to be done by interventional radiology under fluoro guidance.   Electronic Signatures: Dow Adolphein, Beverly Suriano (MD)  (Signed 02-Jul-15 18:14)  Authored: Details   Last Updated: 02-Jul-15 18:14 by Dow Adolphein, Beckhem Isadore (MD)

## 2014-12-05 NOTE — H&P (Signed)
PATIENT NAME:  Robert [name], Robert [name] MR#:  [id number] DATE OF BIRTH:  06-30-1956  DATE OF ADMISSION:  04/15/2014  REFERRING PHYSICIAN: Sheryl L. Mindi JunkerGottlieb, MD  PRIMARY CARE DOCTOR: Nonlocal.   ADMISSION DIAGNOSIS: Sepsis.   HISTORY OF PRESENT ILLNESS: This is a 59 year old African American gentleman with end-stage renal disease, who felt feverish on dialysis today. He went back to his, nursing home where somebody reported that that he had a positive blood culture. This is unclear if this was in reference to previous cultures drawn in his last 5862-month-long hospitalization, or if these were blood cultures that had been drawn on dialysis in previous days. That notwithstanding, the patient was febrile at the time and was brought to the Emergency Department, where he was found to have a leukocytosis. Due to his complicated medical conditions, the Emergency Department staff called for admission.   The patient nods his head "yes" and "no", but cannot contribute details to his history.   REVIEW OF SYSTEMS: Unable to obtain as the patient cannot specifically endorse or verify understanding of complete review of systems. Notably, he shakes his head "no" to chest pain or shortness of breath. He also indicated that he does not have abdominal pain, but admitted to having fever earlier.   PAST MEDICAL HISTORY: Significant for: Cerebrovascular accident, dysphasia, peripheral vascular disease, chronic kidney disease, gastroesophageal reflux disease, hypertension, diabetes type 2, multidrug resistant UTIs positive for VRE. He has history of fungemia, as well as Clostridium difficile colitis.   PAST SURGICAL HISTORY: Trach placement, PEG tube placement, a left below the knee amputation.   FAMILY HISTORY: Not available at this time.   SOCIAL HISTORY: As the patient is an invalid, it is clear he does not smoke, drink or do any drugs at this time. He lives in a nursing home.   MEDICATIONS:  1.  N-acetylcysteine 10%  solution inhaled 2 times a day.  2.  DuoNebs every 4 hours as needed for coughing or shortness of breath. 3.  Aspirin 81 mg per PEG once daily. 4.  Clindamycin 300 mg 1 capsule per PEG every 8 hours x 10 days.  5.  Diflucan 150 mg 1 tablet per PEG tube every 3 days for 5 doses.  6.  Colace 10 mg per mL solution, 5 mL per PEG once daily.  7.  Famotidine 20 mg 1 tablet per PEG daily. 8.  Ferrous sulfate 220 mg per 5 mL solution, 7.5 mL per PEG 2 times per day.  9.  Robinul 1 mg 1 tablet per PEG every 8 hours.  10.  Hydralazine 50 mg 1 tablet per PEG 3 times a day.  11.  Oxycodone 5 mg 1 tablet per PEG 3 times a day.  12.  Scopolamine film 1 patch transdermally every 3 days.  13.  Trazodone 50 mg 1 tablet per PEG before bedtime.  14.  Trileptal 300 mg per 5 mL oral suspension, 5 mL per PEG every 12 hours.   ALLERGIES: REGLAN.   PERTINENT LABORATORY RESULTS AND RADIOGRAPHIC FINDINGS: Glucose is 154, BUN 37, creatinine 3.57, sodium 134, potassium 3.3, chloride 9.5, calcium 8.4, albumin 2.3, alkaline phosphatase 153, AST 18, ALT is 11. White blood cell count 17.8,  hemoglobin is 7.6 and hematocrit is 23.7, MCV is 72.   PHYSICAL EXAMINATION:  VITAL SIGNS: Temperature is 99.9 rectally, pulse is 92, respirations 18, blood pressure 118/64, pulse oximetry is 95% on 2 L of oxygen via nasal cannula.  GENERAL: The patient is sleeping, but easily  arousable and is oriented to person and place. He does not appear to be in any distress.  HEENT: Normocephalic, atraumatic. Extraocular movements are intact. Pupils equal, round, and reactive to light and accommodation. Mucous membranes are moist.  NECK: Trachea is midline. No adenopathy.  Tracheostomy is in place with minimal secretions.  CHEST: Symmetric and atraumatic. CARDIOVASCULAR: Regular rate and rhythm. Normal S1, S2. No rubs, clicks, or murmurs.  LUNGS: Clear to auscultation bilaterally. Normal effort and excursion.  ABDOMEN: Positive bowel sounds,  soft, nontender, nondistended.  No hepatosplenomegaly. PEG tube is in place with protective pad in place to manage gastric secretions.  GENITOURINARY: Normal external male genitalia.  EXTREMITIES: No clubbing or cyanosis. The left arm is swollen due to disuse and need for repositioning. There is a below-the-knee amputation on the left.  SKIN: No rashes. The patient has at very least a stage III sacral decubitus wound, which is dressed and packed. There is a fetid odor from the wound site, but good-looking granulation tissue at the edges.  NEUROLOGIC: The patient has a left hemiparesis and is able to vocalize some sounds that occasionally actually sound like words, but overall has aphasia.  PSYCHIATRIC: Mood is appropriate. It is difficult to assess if it is normal. Affect is also difficult to assess.   ASSESSMENT AND PLAN: This is a 59 year old male with sepsis. He has a history of multidrug resistant organisms in multiple body fluids that is consistent with his immunocompromise status.  1.  Sepsis, leukocytosis and tachycardia, the latter of which is improved. The patient has received a dose of Fortaz and vancomycin in the Emergency Department. He is normotensive and hemodynamically stable. Will continue those antibiotics and monitor blood cultures drawn during this hospitalization. It appears that the only positive blood cultures he has in our electronic medical record are from the beginning of his last hospital admission, which was in June 2015.  2.  Sacral decubitus ulcer. We will continue to clean and dress the wound per wound care recommendations.  3.  End-stage renal disease.  The patient completed dialysis today without any complications. We will continue his treatments per his regular schedule.  4.  Diabetes type 2. We will do sliding scale insulin while the patient is in the hospital.  5.  Seizure disorder. Continue Trileptal.  6.  Deep vein thrombosis prophylaxis. Sequential compression  devices.  7.  Gastrointestinal prophylaxis. We will do famotidine per tube.   The patient is a Full Code, but we will need to discuss his status with his health care power of attorney.   TIME SPENT ON ADMISSION ORDERS AND PATIENT CARE: Approximately 35 minutes.    ____________________________ Kelton Pillar. Sheryle Hail, MD msd:MT D: 04/15/2014 04:02:38 ET T: 04/15/2014 06:22:44 ET JOB#: [id number]  cc: Kelton Pillar. Sheryle Hail, MD, <Dictator> Kelton Pillar Khloe Hunkele MD ELECTRONICALLY SIGNED 04/15/2014 7:54

## 2014-12-05 NOTE — Op Note (Signed)
PATIENT NAME:  Robert [name], Robert [name] MR#:  [id number] DATE OF BIRTH:  January 24, 1956  DATE OF PROCEDURE:  02/19/2014  PREOPERATIVE DIAGNOSES:   1.  End-stage renal disease.  2.  Respiratory failure with recent tracheostomy.  3.  Status post left below-knee amputation.  4.  Hypertension.   POSTOPERATIVE DIAGNOSES: 1.  End-stage renal disease.  2.  Respiratory failure with recent tracheostomy.  3.  Status post left below-knee amputation.  4.  Hypertension.   PROCEDURES: 1.  Ultrasound guidance for vascular access to the left femoral vein.  2.  Fluoroscopic guidance for placement of catheter.  3. Placement of a 44 cm tip-to-cuff tunneled hemodialysis catheter via the left femoral internal jugular vein.   SURGEON:  Annice NeedyJason S. Allexus Ovens, MD.  ANESTHESIA: Local with sedation.   BLOOD LOSS: Minimal.   INDICATION FOR PROCEDURE: This is a gentleman with end-stage renal disease. He has been in the hospital for several weeks. He has had a recent tracheostomy. He has a temporary dialysis catheter in his right groin. He has a central line in his right jugular. Rather than put a new PermCath after recent tracheostomy, we are placing a femoral PermCath.  Risks and benefits were discussed. Informed consent was obtained by the family.   DESCRIPTION OF THE PROCEDURE: The patient was brought to the vascular and interventional radiology suite. The patient's left groin was sterilely prepped and draped and a sterile surgical field was created. The left femoral vein was visualized with ultrasound and found to be patent. It was then accessed under direct ultrasound guidance and a permanent image was recorded. A wire was placed. After a skin nick and dilatation, the peel-away sheath was placed over the wire.   I created a counter incision about 7 cm from the access site and tunneled from the counter incision to the access site selecting a 44 cm tip to cuff tunneled hemodialysis catheter. This was tunneled from the counter  incision to the access site, then placed through the peel-away sheath. The  peel-away sheath was removed. The catheter tips were parked in the retrohepatic vena cava just below the right atrium in excellent location. The appropriate distal connectors were placed, withdrew blood well and flushed easily with heparinized saline and a concentrated heparin solution was placed and secured to the leg with 2 Prolene sutures. The access incision was closed with a single 4-0 Monocryl, and 4-0 Monocryl pursestring suture was used around the exit site. Sterile dressings were placed. The patient tolerated the procedure well and was taken to the recovery room in stable condition.    ____________________________ Annice NeedyJason S. Markia Kyer, MD jsd:ds D: 02/19/2014 16:41:00 ET T: 02/19/2014 21:42:09 ET JOB#: [id number]  cc: Annice NeedyJason S. Caeson Filippi, MD, <Dictator>  Annice NeedyJASON S Ralphie Lovelady MD ELECTRONICALLY SIGNED 03/25/2014 14:27

## 2014-12-05 NOTE — Consult Note (Signed)
PATIENT NAME:  Robert [name], AMSDEN MR#:  [id number] DATE OF BIRTH:  1956/01/06  DATE OF CONSULTATION:  02/20/2014  CONSULTING PHYSICIAN:  Ollen Gross. Willeen Cass, MD  REFERRING PHYSICIAN: Dr. <<MISSING TEXT>>.   CONSULTING PHYSICIAN: Dr. Marion Downer    REASON FOR CONSULTATION: Downsize tracheostomy.   HISTORY OF PRESENT ILLNESS: A 59 year old male with a history of pneumonia who was admitted for respiratory failure. He has a history of chronic kidney disease, prior stroke, multidrug resistant urinary tract infections and peripheral vascular disease. He has had a history of recurrent respiratory failure in the past requiring previous tracheostomy placement. He underwent tracheostomy most recently with Dr. <<MISSING TEXT>> on 02/03/2014. He is now off the ventilator and still has a cuffed endotracheal tube in place.  PAST MEDICAL HISTORY: Prior stroke, dysphagia, history of aspiration pneumonia, peripheral vascular disease, chronic kidney disease, reflux, hypertension, diabetes, multidrug resistant UTI with a history of fungemia, and C. difficile colitis.   PAST SURGICAL HISTORY: Previous tracheostomy placement in the past because of respiratory failure, PEG tube placement, below-the-knee amputation.   ALLERGIES: REGLAN.  MEDICATIONS: Tylenol 650 mg every 4 hours p.r.n. pain or temperature, heparin 5000 units subcutaneous every 8 hours, aspirin 81 mg per PEG daily, Trileptal 300 mg per PEG q. 12 hours, Feosol 220 mg per PEG b.i.d., fentanyl 50 mcg IV q. 1 hour p.r.n. agitation, tigecycline 100 mg infused IV q. 12 hours, docusate sodium 50 mg per PEG daily, sliding-scale insulin, Pepcid 20 mg per PEG q. 24 hours, Ativan 0.5 mg IV every 8 hours p.r.n. anxiety, hydralazine 50 mg per PEG tube t.i.d.   SOCIAL HISTORY: Unobtainable.  REVIEW OF SYSTEMS: Unobtainable as the patient is nonverbal.   PHYSICAL EXAMINATION:  VITAL SIGNS: Temperature 98.7, pulse 76, respirations 20, blood pressure 111/57, oxygen  saturations have been above 95% over the past 12 hours.  GENERAL: A nonverbal, elderly male in no acute distress, hunched over in bed, unable to follow commands. He does open his eyes to verbal commands.  HEAD AND FACE EXAM: Head is normocephalic, atraumatic. No facial or skin lesions.  EARS: External ears are unremarkable. Ear canals are free of cerumen. Tympanic membranes appear clear.  NOSE: External nose unremarkable. Nasal cavity is clear. No purulence or polyps are seen.  ORAL CAVITY AND OROPHARYNX: Lips, gums, tongue, floor of mouth, posterior pharynx appeared grossly clear, although examination is somewhat limited as he has difficulty cooperating with opening the mouth significantly.  NECK: Supple without adenopathy or mass. There is no thyromegaly. Salivary glands are soft and nontender. He has a tracheostomy tube in place which is a cuffed tube with clear secretions.  NEUROLOGIC: Unobtainable.   The cuffed tracheostomy tube was removed and replaced with a number 6 Shiley uncuffed tube. He tolerated this well. Some clear secretions were suctioned after replacement of the tracheostomy tube. The patient tolerated the procedure well and had no difficulty breathing.   ASSESSMENT: This patient has a long history of recurrent respiratory failure, likely related to aspiration pneumonia associated with his prior stroke.   PLAN: While capping the trach is certainly a reasonable option, it is very difficult for me to endorse removal of this tracheostomy tube in the long run wall given his history of recurrent respiratory failure, risk for aspiration pneumonia, and the potential for future respiratory failure. Certainly, repeated tracheostomy increases his risk of complications including laryngeal nerve paralysis and tracheal stenosis in the long run. It would probably be best to leave a tracheostomy tube in this  patient long term.    ____________________________ Ollen GrossPaul S. Willeen CassBennett,  MD psb:lt D: 02/20/2014 19:39:32 ET T: 02/20/2014 21:39:43 ET JOB#: [id number]  cc: Ollen GrossPaul S. Willeen CassBennett, MD, <Dictator>

## 2014-12-05 NOTE — Op Note (Signed)
PATIENT NAME:  Robert [name], Robert [name] MR#:  [id number] DATE OF BIRTH:  09-Dec-1955  DATE OF PROCEDURE:  03/16/2014  PREOPERATIVE DIAGNOSES:  1.  End-stage renal disease.  2.  Hypertension.  3.  Peripheral vascular disease. 4.  Status post left below-knee amputation.  5.  Recent prolonged respiratory failure with tracheostomy.  POSTOPERATIVE DIAGNOSES: 1.  End-stage renal disease.  2.  Hypertension.  3.  Peripheral vascular disease. 4.  Status post left below-knee amputation.  5.  Recent prolonged respiratory failure with tracheostomy.  PROCEDURE:  Right loop forearm arteriovenous graft with Gore Acuseal early stent graft.   SURGEON: Annice NeedyJason S. Jovaughn Wojtaszek, M.D.   ANESTHESIA: General.   ESTIMATED BLOOD LOSS: 100 mL.   INDICATION FOR PROCEDURE: This is a 59 year old gentleman who has been in the hospital for about a month and a half with multiple ongoing issues. He had acute respiratory failure with prolonged intubation. He has had a tracheostomy which has now been capped. He has had previous amputations. He has long-standing chronic kidney disease and now progressed to end-stage renal failure. We were asked to place an early stent graft for use for dialysis in the near future. Risks and benefits were discussed. Informed consent was obtained.   DESCRIPTION OF THE PROCEDURE: The patient is brought to the operative suite, and after an adequate level of general anesthesia was obtained the right upper extremity was sterilely prepped and draped. This was difficult due to a long-standing contracture in his right arm. This had to be navigated to get his arm into a workable position. A transverse incision was created at the antecubital fossa. We dissected down to find a very calcified brachial artery. The brachial vein running with this was a medium size vessel and appeared usable for venous outflow. I then used the most curved tunneler and made a small counterincision in the forearm, and selected a 6-mm Gore Acuseal  medium stent graft and tunneled this keeping the line up for proper orientation. The patient was given 3000 units of intravenous heparin. Then controlled the artery with vascular clamps and created an anterior arteriotomy with an 11 blade which was extended with Potts scissors. The graft was then cut and beveled to match the arteriotomy. An anastomosis was created with a running CV-6 suture in the usual fashion. A single 6-0 Prolene patch suture was used for hemostasis. There was good pulsatile flow through the graft and the graft was then clamped. I then controlled the vein with bulldog clamps and an anterior wall venotomy was created with a #11 blade and extended with Potts scissors. The anastomosis was then created after cutting and beveling the graft to an appropriate length with a running CV-6 suture. The graft was flushed and de-aired prior to release of control. On release, there was good pulsatile flow through the graft and the wound was irrigated. Surgicel and Evicel topical hemostatic agents were placed. Hemostasis was complete. The counter incision was closed with a single 3-0 Vicryl and a single 4-0 Monocryl. The antecubital incision was closed with a running 3-0 Vicryl and 4-0 Monocryl. Dermabond was placed as stressing over both incisions. The patient was then awakened from anesthesia and taken to the recovery room in stable condition having tolerated the procedure well.    ____________________________ Annice NeedyJason S. Raoul Ciano, MD jsd:am D: 03/16/2014 15:51:49 ET T: 03/17/2014 03:00:38 ET JOB#: [id number]  cc: Annice NeedyJason S. Bristol Osentoski, MD, <Dictator> Annice NeedyJASON S Fleetwood Pierron MD ELECTRONICALLY SIGNED 03/25/2014 14:29

## 2014-12-05 NOTE — Consult Note (Signed)
PATIENT NAME:  Robert [name], Robert [name] MR#:  [id number] DATE OF BIRTH:  1955/12/04  DATE OF CONSULTATION:  02/20/2014  CONSULTING PHYSICIAN:  Ollen Gross. Willeen Cass, MD  REFERRING PHYSICIAN: Dr. Elpidio Anis  CONSULTING PHYSICIAN: Dr. Marion Downer    REASON FOR CONSULTATION: Downsize tracheostomy.   HISTORY OF PRESENT ILLNESS: A 59 year old male with a history of pneumonia who was admitted for respiratory failure. He has a history of chronic kidney disease, prior stroke, multidrug resistant urinary tract infections and peripheral vascular disease. He has had a history of recurrent respiratory failure in the past requiring previous tracheostomy placement. He underwent tracheostomy most recently with Dr. Elenore Rota on 02/03/2014. He is now off the ventilator and still has a cuffed endotracheal tube in place.  PAST MEDICAL HISTORY: Prior stroke, dysphagia, history of aspiration pneumonia, peripheral vascular disease, chronic kidney disease, reflux, hypertension, diabetes, multidrug resistant UTI with a history of fungemia, and C. difficile colitis.   PAST SURGICAL HISTORY: Previous tracheostomy placement in the past because of respiratory failure, PEG tube placement, below-the-knee amputation.   ALLERGIES: REGLAN.  MEDICATIONS: Tylenol 650 mg every 4 hours p.r.n. pain or temperature, heparin 5000 units subcutaneous every 8 hours, aspirin 81 mg per PEG daily, Trileptal 300 mg per PEG q. 12 hours, Feosol 220 mg per PEG b.i.d., fentanyl 50 mcg IV q. 1 hour p.r.n. agitation, tigecycline 100 mg infused IV q. 12 hours, docusate sodium 50 mg per PEG daily, sliding-scale insulin, Pepcid 20 mg per PEG q. 24 hours, Ativan 0.5 mg IV every 8 hours p.r.n. anxiety, hydralazine 50 mg per PEG tube t.i.d.   SOCIAL HISTORY: Unobtainable.  REVIEW OF SYSTEMS: Unobtainable as the patient is nonverbal.   PHYSICAL EXAMINATION:  VITAL SIGNS: Temperature 98.7, pulse 76, respirations 20, blood pressure 111/57, oxygen saturations have been  above 95% over the past 12 hours.  GENERAL: A nonverbal, elderly male in no acute distress, hunched over in bed, unable to follow commands. He does open his eyes to verbal commands.  HEAD AND FACE EXAM: Head is normocephalic, atraumatic. No facial or skin lesions.  EARS: External ears are unremarkable. Ear canals are free of cerumen. Tympanic membranes appear clear.  NOSE: External nose unremarkable. Nasal cavity is clear. No purulence or polyps are seen.  ORAL CAVITY AND OROPHARYNX: Lips, gums, tongue, floor of mouth, posterior pharynx appeared grossly clear, although examination is somewhat limited as he has difficulty cooperating with opening the mouth significantly.  NECK: Supple without adenopathy or mass. There is no thyromegaly. Salivary glands are soft and nontender. He has a tracheostomy tube in place which is a cuffed tube with clear secretions.  NEUROLOGIC: Unobtainable.   The cuffed tracheostomy tube was removed and replaced with a number 6 Shiley uncuffed tube. He tolerated this well. Some clear secretions were suctioned after replacement of the tracheostomy tube. The patient tolerated the procedure well and had no difficulty breathing.   ASSESSMENT: This patient has a long history of recurrent respiratory failure, likely related to aspiration pneumonia associated with his prior stroke.   PLAN: While capping the trach is certainly a reasonable option, it is very difficult for me to endorse removal of this tracheostomy tube in the long run wall given his history of recurrent respiratory failure, risk for aspiration pneumonia, and the potential for future respiratory failure. Certainly, repeated tracheostomy increases his risk of complications including laryngeal nerve paralysis and tracheal stenosis in the long run. It would probably be best to leave a tracheostomy tube in this patient long term.  ____________________________ Ollen GrossPaul S. Willeen CassBennett, MD psb:lt D: 02/20/2014 19:39:00  ET T: 02/20/2014 21:39:43 ET JOB#: [id number]  cc: Ollen GrossPaul S. Willeen CassBennett, MD, <Dictator> Sandi MealyPAUL S Kentrell Hallahan MD ELECTRONICALLY SIGNED 03/10/2014 9:48

## 2014-12-05 NOTE — Consult Note (Signed)
PATIENT NAME:  Robert [name], Robert [name] MR#:  [id number] DATE OF BIRTH:  1956-04-01  DATE OF CONSULTATION:  09/23/2013  REFERRING PHYSICIAN:   CONSULTING PHYSICIAN:  Stann Mainland. Sampson Goon, MD  REASON FOR CONSULTATION: Sepsis.   HISTORY OF PRESENT ILLNESS: This is a 59 year old gentleman who is a resident at [facility] brought in February 9th due to altered mental status as well as diarrhea. The patient in the ER was noted to be hypotensive and have acute on chronic renal failure as well as hyponatremia. He was having impressive diarrhea. The patient was admitted, started on broad-spectrum antibiotics with Zosyn, vancomycin, and azithromycin. Cultures are pending. Urinalysis was positive. Stool is negative. The patient has been relatively stable hemodynamically and is not on pressors at this time. We are consulted for further antibiotic management.   PAST MEDICAL HISTORY: Hypertension, chronic kidney disease, peripheral artery disease, GERD, CVA with residual dysphagia.   PAST SURGICAL HISTORY: PEG tube placement and left BKA.   SOCIAL HISTORY: The patient resides at [facility]. No tobacco or alcohol.   REVIEW OF SYSTEMS: Not able to be obtained due to the patient's altered mental status.   FAMILY HISTORY: Unknown.   ANTIBIOTICS SINCE ADMISSION:  1.  Zosyn. 2.  Vancomycin.  3.  Azithromycin.   ADDITIONAL MEDICATIONS: Include heparin, acetaminophen, Zofran, pantoprazole, aspirin, gabapentin, insulin, quetiapine.  PHYSICAL EXAMINATION: VITALS: T-max since admission is 97.8, pulse 92, blood pressure 105/39, and sat 89% on 4 liters.  GENERAL: He is lying in bed. He has tremor in his right side. He opens his eyes and is alert, but not really respond to questions.  HEENT: Pupils equal, round, and reactive to light and accommodation.  NECK: Supple. His oropharynx is clear.  HEART: Regular with no murmurs.  LUNGS: Clear to auscultation.  ABDOMEN: Soft, nontender. He has a PEG tube in place.  He has a rectal tube in place as well, but there is no stool noted. He has a Foley catheter in place.  EXTREMITIES: His left BKA site is well healed. Right leg has no clubbing, cyanosis, or edema.  NEUROLOGIC: He is alert but unresponsive, able to move his extremities. He has a tremor on the right.   LABORATORY AND DIAGNOSTICS: Old micro data is reviewed. Of note, the patient has had C. diff in the past with a positive test in October of 2014. He has also had multiple urine cultures growing E. coli, but I do not see any evidence of an ESBL producer in the past. Blood cultures from admission, February 9th, are no growth to date. Urine culture is pending. C. diff is negative x1. White blood count on admission was 16, current 13.3, hemoglobin 9.5, platelets 370. Urinalysis showed 192 whites, 24 reds. Chest x-ray shows increased conspicuity of the bilateral pulmonary opacities in the right upper and lower lobes. Renal function shows acute on chronic renal failure with a creatinine of 5.24 and BUN 59.  IMPRESSION: A 59 year old gentleman who resides in a nursing home with history of diabetes, peripheral vascular disease, status post below-knee amputation on the left, PEG tube placement for dysphagia from prior cerebrovascular accident, admitted with sepsis. His white blood count on admission was 16 and has come down to 13. Clinically, he has remained relatively stable. Clostridium difficile is negative. Other cultures are pending.   RECOMMENDATIONS: 1.  Change the Zosyn to meropenem in case he has an ESBL producing organism causing his urinary tract infection.  2.  Continue vancomycin and azithromycin for  now for potential healthcare-associated pneumonia.  3.  Deescalate antibiotics over the next several days as appropriate.   Thank you for the consult. Will follow with you. ____________________________ Stann Mainlandavid P. Sampson GoonFitzgerald, MD dpf:sb D: 09/23/2013 14:52:10 ET T: 09/23/2013 15:22:19  ET JOB#: [id number]  cc: Stann Mainlandavid P. Sampson GoonFitzgerald, MD, <Dictator> Chrishawna Farina Sampson GoonFITZGERALD MD ELECTRONICALLY SIGNED 09/24/2013 20:50

## 2014-12-05 NOTE — Consult Note (Signed)
Referring Physician:  Epifanio Lesches :   Primary Care Physician:  Denton Lank Amado Nash) : [address] [place], [place],  [address], [phone]  Reason for Consult: Admit Date: 22-Sep-2013  Chief Complaint: encephalopathy  Reason for Consult: encephalopathy   History of Present Illness: History of Present Illness:   Poorly responsive, no family with him, history gathered from prior notes. year old man with prior CVA and CKD presents with AMS.  Found to also have diarrhea.  Also found to have PNA, low sodium and worsening renal failure.  Symptoms are severe.  Nothing makes them better or worse.  No clear changes recently.  No clear provoking factors.  No witnessed seizure activity.  No new focal neurologic deficit to suggest new stroke.  Cannot obtain due to AMS. MEDICAL HISTORY: Significant for hypertension, CKD stage III, peripheral artery disease, GERD, history of CVA with residual dysphagia. He has a PEG tube and gets feedings through PEG tube.   PAST SURGICAL HISTORY: Significant for PEG tube placement and left BKA.   FAMILY HISTORY: According to old charts, significant for diabetes.   SOCIAL HISTORY: The patient right now is at [facility].   MEDICATIONS: He is on:  Suplena 50 mL per hour and water flushes 200 mL every 2 hours.  Acidophilus capsule via PEG tube twice daily.  Flagyl 500 mg 3 times a day for 10 days.  Neurontin 300 mg via PEG tube b.i.d.  Metoprolol 25 mg via PEG tube 1-1/2 tablets b.i.d.  Fingersticks with coverage. Seroquel 25 mg at bedtime.  Aspirin 81 mg daily.  Oxycodone 5 mg every 6 hours p.r.n. for severe pain.  Received Rocephin from February 1st.  Started on Rocephin 1 gram IM daily for 10 days, started on February 1st.  Also started on Flagyl 500 mg via PEG tube, started on February 1st. Levemir 20 units in the morning for diabetes.  Heparin flushes.    Nebulizers.  None.    (Removed):               Past Medical/Surgical Hx:  Electrolyte Imbalance:   C-Diff colitis 06/21/13:   Dialysis = Chronic renal failure:   GERD - Esophageal Reflux:   HTN:   Diabetes:   left below the knee amputation:   Home Medications: Medication Instructions Last Modified Date/Time  albuterol-ipratropium 2.5 mg-0.5 mg/3 mL inhalation solution 3 milliliter(s) inhaled every 4 hours  for 1 week (1 am, 5 am, 9 am, 1 pm, 5 pm, 9 pm) 09-Feb-15 17:52  Flagyl 500 mg oral tablet 1 tab(s) via g-tube 3 times a day for 10 days (9 am, 1 pm, 9 pm) 09-Feb-15 17:53  Rocephin 1 g injectable powder for injection 1 gram(s) intramuscular once a day (at bedtime) for 10 days (9 pm) 09-Feb-15 17:52  Acidophilus - oral capsule 1 cap(s) via g-tube 2 times a day for 14 days (9 am, 9 pm) 09-Feb-15 17:53  atropine ophthalmic 1% ophthalmic solution 3 drop(s) sublingual 3 times a day (6 am, 2 pm, 10 pm) 09-Feb-15 17:52  heparin 5000 units/mL injectable solution 0.5 milliliter(s) subcutaneous every 8 hours (6 am, 2 pm, 10 pm) 09-Feb-15 17:52  aspirin 81 mg oral tablet, chewable 1 tab(s) via g-tube once a day (8 am) 09-Feb-15 17:53  Levemir 100 units/mL subcutaneous solution 20 unit(s) subcutaneous once a day (in the morning) (8 am) 09-Feb-15 17:52  gabapentin 600 mg oral tablet 1 tab(s) via g-tube once a day (at bedtime) (  8 pm) 09-Feb-15 17:53  QUEtiapine 25 mg oral tablet 1 tab(s) via g-tube once a day (at bedtime) (8 pm) 09-Feb-15 17:53  Senna 8.6 mg oral tablet 2 tab(s) (17.2 mg) via g-tube once a day (at bedtime) (8 pm) 09-Feb-15 17:53  famotidine 20 mg oral tablet 1 tab(s) via g-tube 2 times a day (6 am, 6 pm) 09-Feb-15 17:53  Lasix 40 mg oral tablet 1 tab(s) via g-tube 2 times a day (8 am, 8 pm) 09-Feb-15 17:53  metoprolol tartrate 25 mg oral tablet 1.5 tab(s) (37.5 mg) via g-tube 2 times a day (8 am, 8 pm) 09-Feb-15 17:53  gabapentin 300 mg oral capsule 1 cap(s) via g-tube 2 times a day (8 am, 8 pm) 09-Feb-15 17:53   oxyCODONE 5 mg oral tablet 1 tab(s) via g-tube every 6 hours, As Needed for moderate to severe pain 09-Feb-15 17:53  NovoLOG 100 units/mL subcutaneous solution  subcutaneous 4 times a day, As Needed per sliding scale (12 am, 6 am, 12 pm, 6 pm): if FSBS 0-199 = 0 units if FSBS 200-249 = 2 units if FSBS 250-299 = 4 units if FSBS 300-349 = 6 units if FSBS 350-399 = 8 units if FSBS 400-449 = 10 units if FSBS 450-499 = 12 units if FSBS 500 or greater = call MD 09-Feb-15 17:52   [facility] Current Meds:  Sodium Chloride 0.9%, 1000 ml at 80 ml/hr  NORepinephrine Drip, ( Levophed ) 4 mg D5W 28m (premix)  Concentration: (16 mcg/ml) - Final Volume: 250 mL, Dose Rate: 4 mcg/min at initial rate of 15 ml/hr, Admin Instructions: **CENTRAL LINE ONLY**  Acetaminophen * tablet, ( Tylenol (325 mg) tablet)  650 mg Oral q4h PRN for pain or temp. greater than 100.4  - Indication: Pain/Fever  Azithromycin injection, 500 mg in Dextrose 5% 250 ml, IV Piggyback, q24h, Infuse over 60 minute(s)  Indication: Infection, -Then pharmacy to dose  Ondansetron injection, ( Zofran injection )  4 mg, IV push, q4h PRN for Nausea/Vomiting  Indication: Nausea/ Vomiting  Piperacillin-Tazobactam injection, ( Zosyn )  3.375 gram, IV Piggyback, q12h, Infuse over 4 hour(s)  Indication: Infection  Pharmacy dose per Zosyn Ext Inf Protocol, <<<Extended infusion protocol>>>  Pantoprazole injection,  ( Protonix injection )  40 mg, IV push, q6am  Indication: Erosive Esophagitis/ GERD, 1. Reconstitute Protonix with 131mof Normal Saline; concentration= 11m83mL. Once reconstituted; Protonix must be administered within 30 minutes.  2. I.V. line must be flushed before and after administration. Administer 46m51m0mg66mV. push over 2-3 minutes.  HePARin injection, 5000 unit(s), Subcutaneous, q12h  Indication: Anticoagulant, Monitor Anticoags per hospital protocol  Insulin SS -Novolog injection, Subcutaneous, FSBS q6h     2 unit(s) if  FSBS 200 - 249     4 unit(s) if FSBS 250 - 299     6 unit(s) if FSBS 300 - 349     8 unit(s) if FSBS 350 - 399     10 unit(s) if FSBS 400 - 449     12 unit(s) if FSBS 450 - 499, if BS >500 call MD.  [WastDustin Folks: Black]  Aspirin Chewable, 81 mg Oral daily  - Indication: Pain/Fever/Thromboembolic Disorders/Post MI/Prophylaxis MI  Gabapentin  capsule, ( Neurontin)  300 mg Oral bid  - Indication: Seizures/ Mood Stabilizer  Gabapentin  capsule, ( Neurontin)  600 mg Oral at bedtime  - Indication: Seizures/ Mood Stabilizer  QUEtiapine tablet, 25 mg Oral at bedtime  - Indication: Management of signs/symptoms of psychotic disorders/Anxiety  Levofloxacin injection, ( Levaquin injection )  500 mg, IV Piggyback, q48h, Infuse over 60 minute(s)  Indication: Infection  Allergies:  Reglan: Unknown  Vital Signs:  **Vital Signs.:   10-Feb-15 09:00  Vital Signs Type Routine  Pulse Pulse 92  Respirations Respirations 22  Systolic BP Systolic BP 494  Diastolic BP (mmHg) Diastolic BP (mmHg) 39  Mean BP 61  Pulse Ox % Pulse Ox % 89  Pulse Ox Activity Level  At rest  Oxygen Delivery 4L  Pulse Ox Heart Rate 94    12:00  Vital Signs Type Routine  Temperature Temperature (F) 98.3  Celsius 36.8  Pulse Pulse 90  Respirations Respirations 19  Systolic BP Systolic BP 496  Diastolic BP (mmHg) Diastolic BP (mmHg) 43  Mean BP 68  Pulse Ox % Pulse Ox % 94  Pulse Ox Activity Level  At rest  Oxygen Delivery 4L  Pulse Ox Heart Rate 92   EXAM: GENERAL: No sedation, wakes to sternal rub and attends, intermittent tracking, no spontaneous sounds.  EYES: PERRL b/l.  Funduscopic exam without clear evidence of papilledema.  CARDIOVASCULAR: S1 and S2 sounds are within normal limits, without murmurs, gallops, or rubs.  MUSCULOSKELETAL: BKA noted on right. Bulk - Normal Tone - Incrased in RUE, chronic Pronator Drift - Minimally responsive, unable to test. Ambulation - Minimally responsive, unable  to test.  Strength - Minimally responsive, unable to test, occasional LUE spontaneous movements, RUE and RLE weak from prior stroke, RLE BKA noted.   NEUROLOGICAL: MENTAL STATUS: Eyes open to sternal rub, no effort to speak.  Follows some simple commands like lifting up left arm.    CRANIAL NERVES: Normal    CN II - Pupils are reactive to light. Normal    CN III, IV, VI Moves eyes grossly Normal    CN V Minimally responsive, unable to test. Normal    CN VII Minimally responsive, unable to test. Normal    CN VIII Minimally responsive, unable to test. Normal    CN IX/X Minimally responsive, unable to test. Normal    CN XI Minimally responsive, unable to test. Normal    CN XII Minimally responsive, unable to test.  SENSATION: Does not grimace to noxious stim in RUE.   REFLEXES: Trace in LUE and LLE, increased in RUE.     COORDINATION/CEREBELLAR: Minimally responsive, unable to test..  Lab Results:  Hepatic:  09-Feb-15 17:12   Bilirubin, Total 0.5  Alkaline Phosphatase  196 (45-117 NOTE: New Reference Range 07/04/13)  SGPT (ALT) 28  SGOT (AST)  73  Total Protein, Serum 6.6  Albumin, Serum  1.6  Routine Micro:  09-Feb-15 17:12   Micro Text Report CLOSTRIDIUM DIFFICILE   C.DIFFICILE ANTIGEN       C.DIFFICILE GDH ANTIGEN : POSITIVE   C.DIFFICILE TOXIN A/B     C.DIFFICILE TOXINS A AND B : NEGATIVE   PCR FOR TOXIGENIC C.DIFF  PCR FOR TOXIGENIC C.DIFFICILE : NEGATIVE   INTERPRETATION Negative for toxigenic  C. difficile. Toxin gene and active toxin production not detected. May be a nontoxigenic strain of C. difficile bacteria present, lacking the ability to produce toxin.    ANTIBIOTIC                          18:45   Micro Text Report BLOOD CULTURE   COMMENT                   NO GROWTH IN  8-12 HOURS   ANTIBIOTIC                       Culture Comment NO GROWTH IN 8-12 HOURS  Result(s) reported on 23 Sep 2013 at 02:00AM.    18:53   Micro Text Report BLOOD CULTURE    COMMENT                   NO GROWTH IN 8-12 HOURS   ANTIBIOTIC                       Culture Comment NO GROWTH IN 8-12 HOURS  Result(s) reported on 23 Sep 2013 at 03:00AM.  Lab:  09-Feb-15 17:49   pH (ABG) 7.41  PCO2 42  PO2  54  FiO2 28  Base Excess 1.8  HCO3  26.6  O2 Saturation 92.7  O2 Device nasal cannula  Specimen Site (ABG) RT BRACHIAL  Specimen Type (ABG) ARTERIAL  Patient Temp (ABG) 37.0 (Result(s) reported on 22 Sep 2013 at 06:01PM.)  Lactic Acid, Cardiopulmonary  2.0 (Result(s) reported on 22 Sep 2013 at 06:01PM.)  Routine Chem:  09-Feb-15 17:12   Glucose, Serum  214  BUN  55  Creatinine (comp)  4.96  Sodium, Serum  115  Potassium, Serum  5.4  Chloride, Serum  80  CO2, Serum 28  Calcium (Total), Serum  8.1  Anion Gap 7  Osmolality (calc) 254  eGFR (African American)  14  eGFR (Non-African American)  12 (eGFR values <16m/min/1.73 m2 may be an indication of chronic kidney disease (CKD). Calculated eGFR is useful in patients with stable renal function. The eGFR calculation will not be reliable in acutely ill patients when serum creatinine is changing rapidly. It is not useful in  patients on dialysis. The eGFR calculation may not be applicable to patients at the low and high extremes of body sizes, pregnant women, and vegetarians.)  Result Comment SODIUM - RESULTS VERIFIED BY REPEAT TESTING.  - NOTIFIED OF CRITICAL VALUE  - CALLED TO LINDA MCLAMB @ 1839 ON 09/22/13  - READ-BACK PROCESS PERFORMED. TROPONIN - RESULTS VERIFIED BY REPEAT TESTING.  - CALLED TO LINDA MCLAMB @ 16789ON 09/22/13  - READ-BACK PROCESS PERFORMED.  - CAF  Result(s) reported on 22 Sep 2013 at 06:11PM.  Ethanol, S. < 3  Ethanol % (comp) < 0.003 (Result(s) reported on 22 Sep 2013 at 06:11PM.)    18:45   Ammonia, Plasma < 10 (Result(s) reported on 22 Sep 2013 at 07:24PM.)    21:41   Result Comment TROPONIN - RESULTS VERIFIED BY REPEAT TESTING.  - PREV. C/ 09-22-13 @1839  BY CAF. AJO   Result(s) reported on 22 Sep 2013 at 10:54PM.  10-Feb-15 02:14   Result Comment TROPONIN - RESULTS VERIFIED BY REPEAT TESTING.  - PREV. C/ 09-22-13 @1839  BY CAF. AJO  Result(s) reported on 23 Sep 2013 at 03:05AM.    04:36   Glucose, Serum  295  BUN  58  Creatinine (comp)  4.86  Sodium, Serum  114  Potassium, Serum 5.1  Chloride, Serum  80  CO2, Serum 24  Calcium (Total), Serum 8.5  Anion Gap 10  Osmolality (calc) 258  eGFR (African American)  14  eGFR (Non-African American)  12 (eGFR values <632mmin/1.73 m2 may be an indication of chronic kidney disease (CKD). Calculated eGFR is useful in patients with stable renal function. The eGFR calculation will not be reliable in acutely ill patients when serum  creatinine is changing rapidly. It is not useful in  patients on dialysis. The eGFR calculation may not be applicable to patients at the low and high extremes of body sizes, pregnant women, and vegetarians.)  Result Comment SODIUM - RESULTS VERIFIED BY REPEAT TESTING.  - NOTIFIED OF CRITICAL VALUE  - C/ MICHELLE WILLIAMS @0503  09-23-13. AJO  - READ-BACK PROCESS PERFORMED. POTASSIUM/MAGNESIUM - Slight hemolysis, interpret results with  - caution.  Result(s) reportedon 23 Sep 2013 at 05:12AM.  Magnesium, Serum 2.1 (1.8-2.4 THERAPEUTIC RANGE: 4-7 mg/dL TOXIC: > 10 mg/dL  -----------------------)  Urine Drugs:  07-EML-54 49:20   Tricyclic Antidepressant, Ur Qual (comp) NEGATIVE (Result(s) reported on 22 Sep 2013 at 06:27PM.)  Amphetamines, Urine Qual. NEGATIVE  MDMA, Urine Qual. NEGATIVE  Cocaine Metabolite, Urine Qual. NEGATIVE  Opiate, Urine qual NEGATIVE  Phencyclidine, Urine Qual. NEGATIVE  Cannabinoid, Urine Qual. NEGATIVE  Barbiturates, Urine Qual. NEGATIVE  Benzodiazepine, Urine Qual. NEGATIVE (----------------- The URINE DRUG SCREEN provides only a preliminary, unconfirmed analytical test result and should not be used for non-medical  purposes.  Clinical consideration  and professional judgment should be  applied to any positive drug screen result due to possible interfering substances.  A more specific alternate chemical method must be used in order to obtain a confirmed analytical result.  Gas chromatography/mass spectrometry (GC/MS) is the preferred confirmatory method.)  Methadone, Urine Qual. NEGATIVE  Cardiac:  09-Feb-15 17:12   Troponin I  0.10 (0.00-0.05 0.05 ng/mL or less: NEGATIVE  Repeat testing in 3-6 hrs  if clinically indicated. >0.05 ng/mL: POTENTIAL  MYOCARDIAL INJURY. Repeat  testing in 3-6 hrs if  clinically indicated. NOTE: An increase or decrease  of 30% or more on serial  testing suggests a  clinically important change)  CK, Total  1022 (39-308 NOTE: NEW REFERENCE RANGE  09/15/2013)  CPK-MB, Serum 3.2 (Result(s) reported on 22 Sep 2013 at 10:01PM.)    21:41   Troponin I  0.10 (0.00-0.05 0.05 ng/mL or less: NEGATIVE  Repeat testing in 3-6 hrs  if clinically indicated. >0.05 ng/mL: POTENTIAL  MYOCARDIAL INJURY. Repeat  testing in 3-6 hrs if  clinically indicated. NOTE: An increase or decrease  of 30% or more on serial  testing suggests a  clinically important change)  CK, Total  960 (39-308 NOTE: NEW REFERENCE RANGE  09/15/2013)  CPK-MB, Serum 3.5 (Result(s) reported on 22 Sep 2013 at 10:46PM.)  10-Feb-15 02:14   Troponin I  0.08 (0.00-0.05 0.05 ng/mL or less: NEGATIVE  Repeat testing in 3-6 hrs  if clinically indicated. >0.05 ng/mL: POTENTIAL  MYOCARDIAL INJURY. Repeat  testing in 3-6 hrs if  clinically indicated. NOTE: An increase or decrease  of 30% or more on serial  testing suggests a  clinically important change)  CK, Total  872 (39-308 NOTE: NEW REFERENCE RANGE  09/15/2013)  CPK-MB, Serum  3.7 (Result(s) reported on 23 Sep 2013 at 03:02AM.)  Routine UA:  09-Feb-15 17:12   Color (UA) Amber  Clarity (UA) Cloudy  Glucose (UA) Negative  Bilirubin (UA) Negative  Ketones (UA) Negative  Specific  Gravity (UA) 1.015  Blood (UA) 2+  pH (UA) 5.0  Protein (UA) >=500  Nitrite (UA) Negative  Leukocyte Esterase (UA) 3+ (Result(s) reported on 22 Sep 2013 at 06:49PM.)  RBC (UA) 24 /HPF  WBC (UA) 192 /HPF  Bacteria (UA) 3+  Epithelial Cells (UA) 22 /HPF  Amorphous Crystal (UA) PRESENT (Result(s) reported on 22 Sep 2013 at 06:49PM.)  Routine Hem:  09-Feb-15 17:12   WBC (  CBC)  16.0  RBC (CBC)  3.63  Hemoglobin (CBC)  8.7  Hematocrit (CBC)  26.5  Platelet Count (CBC) 334 (Result(s) reported on 22 Sep 2013 at 05:48PM.)  MCV  73  MCH  23.8  MCHC 32.7  RDW  16.8  10-Feb-15 04:36   WBC (CBC)  13.3  RBC (CBC)  3.87  Hemoglobin (CBC)  9.5  Hematocrit (CBC)  28.3  Platelet Count (CBC) 370  MCV  73  MCH  24.6  MCHC 33.7  RDW  16.9  Neutrophil % 86.0  Lymphocyte % 8.4  Monocyte % 4.5  Eosinophil % 0.8  Basophil % 0.3  Neutrophil #  11.5  Lymphocyte # 1.1  Monocyte # 0.6  Eosinophil # 0.1  Basophil # 0.0 (Result(s) reported on 23 Sep 2013 at 05:12AM.)   Radiology Results: CT:    09-Feb-15 18:32, CT Head Without Contrast  CT Head Without Contrast   REASON FOR EXAM:    AMS  COMMENTS:   May transport without cardiac monitor    PROCEDURE: CT  - CT HEAD WITHOUT CONTRAST  - Sep 22 2013  6:32PM     CLINICAL DATA:  Altered mental status    EXAM:  CT HEAD WITHOUT CONTRAST    TECHNIQUE:  Contiguous axial images were obtained from the base of the skull  through the vertex without intravenous contrast.    COMPARISON:  Prior CT from 05/15/2013  FINDINGS:  Encephalomalacia within the right frontotemporal region again seen,  compatible with remote right MCA territory infarct. Overall size and  distribution of the area affected is not significantly changed  relative to prior exam.    No acute intracranial hemorrhage or large vessel territory infarct  identified. No mass lesion, mass effect, or midlineshift.  Ventricles are normal in size without evidence hydrocephalus.  Mild  chronic microvascular ischemic changes noted. No extra-axial fluid  collection.    Calvarium is intact.  Orbits are normal.    Chronic mucoperiosteal thickening noted within the partially  visualized right maxillary sinus. Paranasal sinuses are otherwise  clear. No mastoid effusion.     IMPRESSION:  1. No acute intracranial process identified.  2. Sequelae of remote right MCA territory infarct.  3. Blood chronic microvascular ischemic changes.  4. Chronic right maxillary sinus disease.      Electronically Signed    By: Jeannine Boga M.D.    On: 09/22/2013 18:48       Verified By: Neomia Glass, M.D.,   Impression/Recommendations: Recommendations:   59 year old man with prior CVA and CKD presents with AMS.  with numerous medical causes for AMS including electrolyte disturbances, fluid imbalance, low sodium (into 110s), PNA, acute on chronic renal failure.  At this point, no additional neurologic workup indicated since HCT unremarkable and no clear new focal neurologic deficit.  No clear history of seizure with this event.     have reviewed the results of the most recent imaging studies, tests and labs as outlined above and answered all related questions. have personally viewed the patient's HCT and it shows old stroke but no new changes. and coordinated plan of care with hospitalist.   Electronic Signatures: Anabel Bene (MD)  (Signed 11-Feb-15 01:49)  Authored: REFERRING PHYSICIAN, Primary Care Physician, Consult, History of Present Illness, Review of Systems, PAST MEDICAL/SURGICAL HISTORY, HOME MEDICATIONS, Current Medications, ALLERGIES, NURSING VITAL SIGNS, Physical Exam-, LAB RESULTS, RADIOLOGY RESULTS, Recommendations   Last Updated: 11-Feb-15 01:49 by Anabel Bene (MD)

## 2014-12-05 NOTE — Consult Note (Signed)
Brief Consult Note: Diagnosis: necrosis of sacral decubitus.   Patient was seen by consultant.   Recommend to proceed with surgery or procedure.   Comments: Sacral decubitus indeed has significant necrosis and will require sharp debridement in the OR. May benefit from wound VAC. Pt to go to OR later tonight because of need to stop TFs for a few hours because of aspiration risk (discussed with anesthesia).  Electronic Signatures: Claude MangesMarterre, Jonavan Vanhorn F (MD)  (Signed 03-Sep-15 17:32)  Authored: Brief Consult Note   Last Updated: 03-Sep-15 17:32 by Claude MangesMarterre, Makyi Ledo F (MD)

## 2014-12-05 NOTE — H&P (Signed)
PATIENT NAME:  Robert [name], Robert [name] MR#:  [id number] DATE OF BIRTH:  02/24/1956  DATE OF ADMISSION:  09/22/2013  PRIMARY CARE PHYSICIAN: Dr. Denton Lank.  EMERGENCY ROOM PHYSICIAN:  Dr. Carrie Mew.     CHIEF COMPLAINT: Altered mental status.   HISTORY OF PRESENT ILLNESS: A 59 year old male patient brought in from [facility] because of altered mental. The patient is a 59 year old male with history of diabetes, CVA and chronic kidney disease, stage III. The patient on PEG tube feeding, brought from nursing home because of altered mental status, diarrhea. The patient noted to have pneumonia, along with septic shock, hyponatremia, acute on chronic renal failure. The patient has diarrhea.  Here, in the Emergency Room, stool for C. diff was sent. Rectal tube was placed, and unable to get any history from the patient and history obtained from the chart, ER  physician and ER staff.   PAST MEDICAL HISTORY: Significant for hypertension, CKD stage III, peripheral artery disease, GERD, history of CVA with residual dysphagia. He has a PEG tube and gets feedings through PEG tube.   PAST SURGICAL HISTORY: Significant for PEG tube placement and left BKA.   SOCIAL HISTORY: The patient right now is at [facility].   MEDICATIONS: He is on:  1.  Suplena 50 mL per hour and water flushes 200 mL every 2 hours.  2.  Acidophilus capsule via PEG tube twice daily.  3.  Flagyl 500 mg 3 times a day for 10 days.  4.  Neurontin 300 mg via PEG tube b.i.d.  5.  Metoprolol 25 mg via PEG tube 1-1/2 tablets b.i.d.  6.  Fingersticks with coverage. 7.  Seroquel 25 mg at bedtime.  8.  Aspirin 81 mg daily.  9.  Oxycodone 5 mg every 6 hours p.r.n. for severe pain.  10.  Received Rocephin from February 1st.  Started on Rocephin 1 gram IM daily for 10 days, started on February 1st.  11.  Also started on Flagyl 500 mg via PEG tube, started on February 1st. 12.  Levemir 20 units in the morning for diabetes.  13.  Heparin  flushes.    14.  Nebulizers.   PAST MEDICAL HISTORY: Significant for hypertension, diabetes, history of C. diff colitis before.   REVIEW OF SYSTEMS:  Not obtainable at this time because of his altered mental status, but his baseline is he is able to tell yes or no by shaking his head.   FAMILY HISTORY: According to old charts, significant for diabetes.   ALLERGIES: None.   PHYSICAL EXAMINATION: VITAL SIGNS: Temperature 98.6 here, but when he was sent he had temperature of 101 Fahrenheit, and the patient's blood pressure was 98/68 at [facility]. Here, it is 87/44. Sats 84% on room air, but he is right now on 4 liters, saturating at 92%.  GENERAL:  The patient is a 59 year old male who is seen in the ER, very lethargic, unable to answer any questions.  HEENT:  Head:  Normocephalic, atraumatic.  Eyes: Pupils equally reacting to light. No conjunctival pallor. No scleral icterus. Nose: No nasal lesions. No drainage. Mouth: The patient clinically appears very dry mucosa. No exudates.  NECK: Supple. No masses. The patient has no lymphadenopathy.  RESPIRATORY: Bilateral breath sounds present. Diffuse rhonchi in both lung fields. Not using accessory muscles of respiration.  CARDIOVASCULAR: S1, S2 regular, tachycardic. The patient has no murmurs. Carotid upstroke 2+ bilaterally. No bruit.  EXTREMITIES: The patient has a dorsalis pedal pulse on  the right foot. Has left BKA.  ABDOMEN: Soft. PEG tube in place. No signs of infection and no masses felt. Bowel sounds are present.  SKIN: The patient has no ulcers, clinically dehydrated.  NEUROLOGIC: Unable to exam because the patient is lethargic and not able to participate on neurological exam. PSYCHIATRIC: Right now, he is lethargic.   LABORATORY DATA: Shows electrolytes, sodium is 115, potassium 5.4, chloride 80, bicarb 28, BUN 55, creatinine 4.96, glucose 214. LFTs: Alk phos is 119 and ALT 73.   Troponin 0.1.   UA: Leukocyte esterase 3+, 192 WBC  and 3+ bacteria.   PH 7.41,, pCO2 of 42, pO2 of 54 and bicarb is 26.6.   CT head shows no acute intracranial process. Remote right MCA stroke. Chronic right maxillary sinusitis. Chronic microvascular ischemic changes.   Chest x-ray shows increased bilateral pulmonary opacities in the right upper lobe, right lower lobe concerning for pneumonia. Ammonia level is less than 10.   EKG shows normal sinus, 76 beats per minute.   ASSESSMENT AND PLAN: This patient is a 59 year old male with multiple medical problems, comes in with fever, hypotension, diarrhea. The patient is found to have altered mental status. His diagnoses include septic shock, multifactorial, likely secondary to pneumonia, UTI and possible C. diff colitis. The patient is going to be admitted to ICU for septic shock, and the patient is on broad-spectrum antibiotics, but should cover pneumonia and UTI. Follow the blood cultures. The patient is on vanco, Zosyn and Zithromax. The patient is on IV fluids, normal saline at 80 mL an hour. Follow blood cultures and urine cultures.  1.  Acute respiratory failure secondary to pneumonia. ABG is obtained, shows hypoxia on room air. Right now, he is on 4 liters, sats around 92%. Continue DuoNebs and oxygen, along with antibiotics and follow repeat x-ray tomorrow.  2.  Hyponatremia. The patient is very dehydrated. Has acute on chronic renal failure. The patient's creatinine is around 2.5. The patient's creatinine January 17 was 2.8. I am going to continue IV fluids, normal saline at 60. Because of hyponatremia, rapid correction is not a good idea, so I am doing normal saline at 60 an hour. I spoke with Dr. Candiss Norse, who agrees with the same.  If needed, we can start him on Levophed for hypotension and sepsis.  3.  Urinary tract infection with white blood cells of 192 and 3+ bacteria and possibly has urosepsis, and he is also on vanco and Zosyn. Follow cultures.  4.  Altered mental status secondary to  metabolic and toxic encephalopathy. CT head unremarkable,  5.  Diabetes mellitus type 2. The patient has a PEG tube and probably can start PEG tube feedings and start insulin sliding scale coverage.  6.  History of stroke.  7.  Hyperkalemia. The patient did receive Kayexalate.  8.  Diarrhea. The patient's C. diff has been sent. Please follow the C. diff test results.  9.  The patient does have isolated alk phos elevation. Monitor LFTs closely. At this time, I think it is probably due to sepsis.  10.  Troponin is elevated at 0.10, likely demand ischemia versus renal failure. Send out troponins and also get an echocardiogram.   TIME SPENT: About 60 minutes. This is a critical care history and physical.        ____________________________ Epifanio Lesches, MD sk:dmm D: 09/22/2013 19:42:56 ET T: 09/22/2013 21:41:10 ET JOB#: [id number]  cc: Epifanio Lesches, MD, <Dictator> Epifanio Lesches MD ELECTRONICALLY SIGNED 09/23/2013 18:46

## 2014-12-05 NOTE — Discharge Summary (Signed)
PATIENT NAME:  Robert [name], Robert [name] MR#:  [id number] DATE OF BIRTH:  [birth year]  DATE OF ADMISSION:  01/26/2014 DATE OF DISCHARGE:  03/17/2014 (anticipated)  Please refer the patient (Dictation Anomaly)<<MISSING TEXT>> interim discharge summary dictated by me on June 21.  Also, discharge summary done by Dr. Luberta MutterKonidena on 01/31/2014, and then a discharge summary done by Dr. Alford Highlandichard Wieting on July 28.  From the time of July 28, when I assumed the patients care, patients tracheostomy was capped, and he has done well.  He is recommended to remain N.P.O., and receive tube feeds; however, he keeps reporting that he is going to be eating on his own, which will lead, definitely, for him to be readmitted.  The patient is strongly recommended not to eat.  Also, during on August 3, he underwent an opened dialysis access created right loop forearm by Dr. Wyn Quakerew.  Other than that, the patient has been stable without any significant changes.  His secretions are much improved.    FINAL DIAGNOSIS:   1.  Acute respiratory failure status post tracheostomy, currently on room air.  Will need to follow up with ENT for further care of the tracheostomy. 2.  Clinic sepsis, Citrobacter pneumonia, status post treatment. 3.  End-stage renal disease.  Dialysis Monday, Wednesday, and Friday. 4.  Diabetes. 5.  Anemia of chronic disease. 6.  History of cerebrovascular accident with failed swallow evaluation multiple times.  Patient needs to remain strict N.P.O. and tube feeds only, and I have reiterated this to the patient.    DISCHARGE MEDICATIONS 1.  Albuterol ipratropium 3 mL inhalation every 4 hours as needed. 2.  Oxcarbazepine 300 mg PEG every q. 12. 3.  Aspirin 81 one tab p.o. daily down the PEG. 4.  Humulin R sliding scale. 5.  Scopolamine patch.  Change every 3 days. 6.  Collagenase 250 units/g topically ointment to the sacral area daily. 7.  Glycopyrrolate 1 mg PEG every 8 hours. 8.  Iron sulfate 5 mL PEG b.i.d. 9.  Colace  5 mL PEG daily. 10.  Hydralazine 50 one tab p.o. t.i.d. 11.  Nephrocap. 12.  Pro-Stat 1 pack daily.  Flush with 50 mL of water. 13.  Acetylcysteine 3 mL b.i.d.  DISCHARGE INSTRUCTIONS: 1.  Diet:  Strict N.P.O.  Tube feeds with Nephro-Carb steady RTH 50 mL per hour. 2.  Activity:  As tolerated. 3.  Referral:  Dialysis Monday, Wednesday, and Friday. 4.  Follow up:  At rehabilitation, at doctor in 1-2 days.  Follow with Dr. Willeen CassBennett, ENT, for tracheostomy followup.  TIME SPENT:  35 minutes.   ____________________________ Viviano SimasLauren [name]. Jadia Capers, MD lje:ts D: 03/17/2014 12:53:10 ET T: 03/17/2014 13:06:00 ET JOB#: [id number]  cc: Viviano SimasLauren [name]. Lavita Pontius, MD, <Dictator>

## 2014-12-05 NOTE — Consult Note (Signed)
PATIENT NAME:  Robert [name], Robert [name] MR#:  [id number] DATE OF BIRTH:  September 23, 1955  DATE OF CONSULTATION:  09/26/2013  REFERRING PHYSICIAN:   CONSULTING PHYSICIAN:  Ezzard StandingPaul Y. Bluford Kaufmannh, MD  REASON FOR REFERRAL:  G-tube placement assessment.   DISCUSSION:  The patient is a 59 year old black male who was admitted on 02/09 for altered mental status. He was found to be in overwhelming sepsis. He now has Clostridium difficile. He has respiratory failure and is on ventilation. He also has a history of stroke and chronic kidney disease. He apparently had a PEG placed in the past and was using it at the nursing home. According to the report, it fell out before he was admitted. It is unclear who reinsert the G-tube back in or not. It was probably one of the nursing staff at the skilled facility.   Anyway I was consulted to make sure that it was in correct position. According to the nurses, the patient was given 2 feedings through the G-tube after the patient was admitted without any problems. There may have been a few days where the residual was relatively high. I got called to see whether the G-tube appears to be in the correct position or not.   Currently he is not doing well but family wants heroic measures still. He is on azithromycin, gabapentin, meropenem, Protonix, insulin drip, baby aspirin, vancomycin, heparin injection, Lasix drip, norepinephrine drip and propofol drip.   PHYSICAL EXAMINATION: VITAL SIGNS:  Afebrile, pulse is 68, respirations 18, blood pressure is low at 81/42.  HEAD AND NECK:  Within normal limits.  CARDIAC:  Regular rhythm and rate.  LUNGS:  Clear anteriorly.  ABDOMEN:  Soft, nontender. Some decreased bowel sounds. The G-tube site appears to be intact. I do not see any redness or leakage of tube feedings.  EXTREMITIES:  No clubbing, cyanosis or edema.   LABORATORY DATA:  Most recent labs showed a creatinine of 6.11, sodium 125, potassium 3.4. White count is 16.4, hemoglobin is 7.5, MCV 72.    ASSESSMENT AND PLAN:  This is a patient who has had G-tube that apparently fell out then it got reinserted. The question was whether the tube was in correct position or not. I recommended that we have the radiologist do an x-ray after contrast given through the G-tube and check it under fluoroscopy to make sure it is in the correct position. I then talked to Radiology people. They mentioned that this was actually done on Tuesday. When I reviewed the report, the G-tube was already in correct position. Since it was in correct position after the G-tube was reinserted, there is really no need to repeat the study at this time. I recommend resuming the tube feeding and see how he does. If there is any problems, please contact us again. Thank you for the referral.    ____________________________ Ezzard StandingPaul Y. Bluford Kaufmannh, MD pyo:jm D: 09/26/2013 09:45:54 ET T: 09/26/2013 09:57:02 ET JOB#: [id number]  cc: Ezzard StandingPaul Y. Bluford Kaufmannh, MD, <Dictator> Ezzard StandingPAUL Y Maxie Debose MD ELECTRONICALLY SIGNED 09/28/2013 8:35

## 2014-12-05 NOTE — Consult Note (Signed)
PATIENT NAME:  Robert [name], Robert [name] MR#:  [id number] DATE OF BIRTH:  07-Sep-1955  DATE OF CONSULTATION:  02/12/2014  REFERRING PHYSICIAN:  Dr. Luberta Mutter. CONSULTING PHYSICIAN:  Keturah Barre, NP  REASON FOR CONSULTATION:  Consult ordered by Dr. Luberta Mutter for evaluation of malfunctioning PEG.   HISTORY OF PRESENT ILLNESS: I appreciate consult for a 59 year old [place] man with complicated health history requiring tracheostomy PEG placement for evaluation of a malfunctioning PEG tube. External PEG placed on the 14th by Dr. Servando Snare.  In review, this later fell out at nursing home and was replaced; however, these details are not available as to what type, how, who did it. It currently has 3 ports, 1 labeled jejunum, 1 presumed to be gastric, and 1 presumed inflation port. There was concern at  hospitalization about it in which Dr. Bluford Kaufmann evaluated the tube with KUB and contrast injection and it was still functioning well, so no further recommendations were made.  Patient's nurse today reports the tube was leaking significantly at 1 time.  The jejunal port would not flush.  Gastric port was still functioning with tube feeds, which apparently he is tolerating well.   PAST MEDICAL HISTORY: CVA with residual dysphasia and aspiration pneumonia necessitating PEG placement, PVD status post left BKA, chronic kidney stage III, GERD, hypertension, diabetes, pneumonia, acute respiratory failure status post trach, multi-drug-resistant urinary tract infection including VRE and Candida urinary tract infection fungemia last hospitalization, C. difficile colitis, and PEG placement after stroke.   ALLERGIES: REGLAN.   CURRENT MEDICATIONS: DuoNeb 3 mL q. 4 h. p.r.n., ASA 81 mg p.o. daily, atorvastatin 5 mg p.o. at bedtime, Pepcid 20 mg p.o. daily, Levemir 20 units subcutaneous b.i.d., sodium chloride tablets 3 grams q. 6 h., Trileptal 300 mg per 5 L twice a day, vitamin D3 at 2000 international units q. week.   SOCIAL  HISTORY: Resident of [facility].  Dorann Lodge, his sister, is primary Management consultant. No current alcohol or EtOH.   FAMILY HISTORY: Significant for diabetes.   REVIEW OF SYSTEMS: Unable to obtain as patient is trached and has a PEG tube.  He does have a complicated health history.  Appreciate a multidisciplinary note.  MOST RECENT LABORATORIES: Glucose 162, BUN 59, creatinine 2.27, sodium 142, potassium 3.7, GFR 36, calcium 7.8, phosphorus 4.3, albumin 1.4. WBC 13.6, hemoglobin 9.5, hematocrit 30.7, platelet count 399,000.   PHYSICAL EXAMINATION:  VITAL SIGNS:  Most recent vital signs: Temperature 97.7, pulse 98, respiratory rate 19, blood pressure 159/75, oxygen saturation of 97% on room air via his trach.  GENERAL: Comfortable -appearing man resting in bed.  He tracks with eyes at times, but does not attempt to communicate.  HEENT: Normocephalic, atraumatic. Sclerae clear.  Trach secure, midline anterior neck.   RESPIRATORY:  Respiratory rate regular, unlabored. Breath sounds somewhat coarse.  CARDIOVASCULAR: Regular rate, mild generalized edema.  ABDOMEN: Soft bowel sounds. Feeding tube secure and patent via the gastric port, nondistended. No apparent tenderness. No guarding, rigidity, peritoneal signs.  SKIN: Warm, dry, and pink. Bandage to right heel intact, somewhat loose. EXTREMITIES: Some contractures.  Noticed some tremors to the right hand at times, otherwise, no spontaneous purposeful movement at this time.  PSYCHIATRIC: Unable to assess due to his condition.  NEUROLOGIC: Tracks with eyes at times.  Does not follow commands.  Does not respond to questions. Pupils equal and reactive, 3 mm.   IMPRESSION AND PLAN: Partially occluded feeding tube discussed with Dr. Alycia Rossetti. We will evaluate with a KUB  in contrast to assess placement and function.   Thank you very much for this consult.   These services were provided by Vevelyn Pathristiane Lavone Barrientes, MSN, [facility], in collaboration with Trixie DeisMatthew Ryan,  M.D., with whom I have discussed this patient in full.    ____________________________ Keturah Barrehristiane H. Cranston Koors, NP chl:ts D: 02/12/2014 17:21:07 ET T: 02/12/2014 18:19:56 ET JOB#: [id number]  cc: Keturah Barrehristiane H. Kento Gossman, NP, <Dictator> Eustaquio MaizeHRISTIANE H Farran Amsden FNP ELECTRONICALLY SIGNED 02/13/2014 7:51

## 2014-12-05 NOTE — Consult Note (Signed)
PATIENT NAME:  Robert [name], Robert [name] MR#:  [id number] DATE OF BIRTH:  11-08-1955  DATE OF CONSULTATION:  01/30/2014  CONSULTING PHYSICIAN:  Stann Mainlandavid P. Sampson GoonFitzgerald, MD  REQUESTING PHYSICIAN: Dr. [name].   REASON FOR CONSULTATION: Vent-dependent respiratory failure, multidrug resistant Acinetobacter pneumonia.   HISTORY OF PRESENT ILLNESS: This is a 59 year old chronically ill gentleman who [facility] for a history of chronic respiratory failure and pneumonia. He also has chronic kidney disease. He was admitted on June 15th with worsening dyspnea and found to have pneumonia on chest x-ray as well as worsening renal failure. The patient was intubated. Since that time, he has been covered with antibiotics but has grown multiple organisms from his sputum including pan-resistant Acinetobacter baumannii. Currently he is intubated, sedated and unable to provide any further history. History is obtained from review of his charts and discussion with medical staff.   PAST MEDICAL HISTORY:  1. CVA with dysphagia.  2. Recurrent aspiration pneumonia.  3. Peripheral vascular disease status post left BKA. 4. Chronic kidney disease.  5. GERD.  6. Hypertension.  7. Diabetes.  8. Recent admission for respiratory failure requiring trach.  9. Multi-drug-resistant UTI with VRE. 10. Fungemia during last hospitalization. 11. Clostridium difficile colitis.  PAST SURGICAL HISTORY:  1. Left below-knee amputation.  2. Tracheostomy followed by reversal. 3. PEG tube.   ALLERGIES: HE IS ALLERGIC TO REGLAN.   SOCIAL HISTORY: [facility]. Family is involved but he has no children.   FAMILY HISTORY: Positive for diabetes.   REVIEW OF SYSTEMS: Unable to be obtained.   ANTIBIOTICS SINCE ADMISSION: Include cefepime given June 15th through the 18th, erythromycin June 16th through the 18th, levofloxacin June 14th, Zosyn June 14th,  vancomycin  level since June 14th. Currently he is on no antibiotics.   PHYSICAL EXAMINATION: VITALS SIGNS: Temperature is 97.5, pulse 77, blood pressure 131/70, respirations 16, sat 99% on ventilator. He is intubated, sedated.  HEENT: Pupils equal, round, and reactive to light and accommodation. Extraocular movements are intact. Sclerae are anicteric. Oropharynx: ET tube in place.  NECK: Supple. He has a right IJ line in place.  HEART: Regular.  LUNGS: Coarse breath sounds bilaterally with crackles.  ABDOMEN: Soft, distended PEG tube in place.  EXTREMITIES: She has a left BKA which the site is well-healed. Right lower extremity has 1+ edema.  NEUROLOGIC: He is sedated.   LABORATORY DATA: Chest x-ray June 15th reveals worsening patchy airspace disease over the right lung and minimally over the left perihilar region. CT of the head showed no acute intracranial abnormalities.   White blood count on admission was 9.2, hemoglobin 12.7, platelets 248,000, currently white count is 6.8, hemoglobin 8.4, platelets 151,000. Renal function: He has a creatinine of 3.36, BUN 94. On admission, his BUN was 69, creatinine 3.54. Liver tests on admission showed a slightly elevated alkaline phosphatase. Blood cultures from the 16th, one of 2 with cns  Staphylococcus, sputum culture done June 16th shows heavy growth of Acinetobacter which was multi-drug-resistant as well as yeast and Klebsiella pneumonia.   IMPRESSION: A 59 year old with chronic kidney disease currently nearing dialysis with chronic illness, as well as recent prolonged hospitalization. He now has an admission with multifocal pneumonia with sputum cultures growing Klebsiella as well as pan-resistant Acinetobacter.   RECOMMENDATIONS:  1. At this point, I would recommend empirically restarting tigecycline and Unasyn. This combination may be effective against the Acinetobacter, but will  have to await final sensitivities. I have asked the microbiology lab  to test for sensitivities to tigecycline, Unasyn, as well as colistin. 2. If he worsens, we may need to add colistin as that is the most likely agent to be effective. However, this has very high rates for renal issues and would likely put him into end-stage renal disease.  3. Continue pulmonary toilet and ventilation management.  4. Thank you for the consult. I will be glad to follow with you.    ____________________________ Stann Mainland. Sampson Goon, MD dpf:lt D: 01/30/2014 21:18:59 ET T: 01/30/2014 21:44:35 ET JOB#: [id number]  cc: Stann Mainland. Sampson Goon, MD, <Dictator> DAVID Sampson Goon MD ELECTRONICALLY SIGNED 02/05/2014 13:30

## 2015-08-29 IMAGING — CT CT HEAD WITHOUT CONTRAST
1 series · 15 of 30 positions shown, 19 images · non-contrast
Comparison: none

REASON FOR EXAM: CVA
COMMENTS:

PROCEDURE:     CT  - CT HEAD WITHOUT CONTRAST  - May 15, 2013 [DATE]
RESULT:
TECHNIQUE: Helical noncontrast 3 mm sections were obtained from the skull
base to the vertex.
Comparison is made to a previous study dated 05/13/2013.

[Series 2: soft tissue · axial · 0.43mm/px · z∈[-66,+74]mm · 15 of 32 slices shown, 19 images]
[im 2/32  brain]
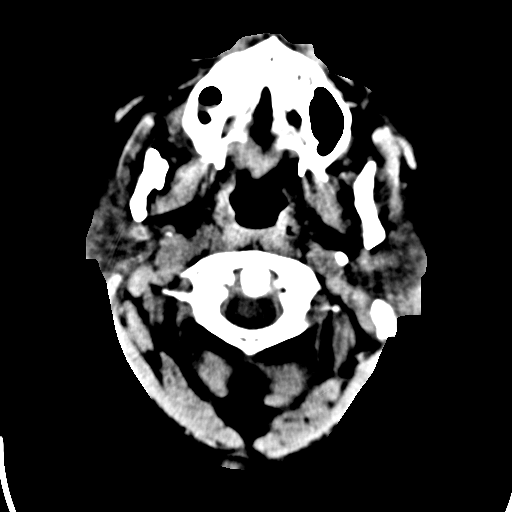
[im 2/32  bone]
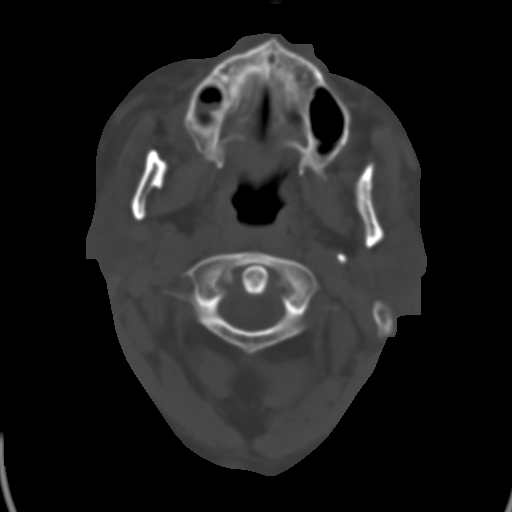
[im 4/32  brain]
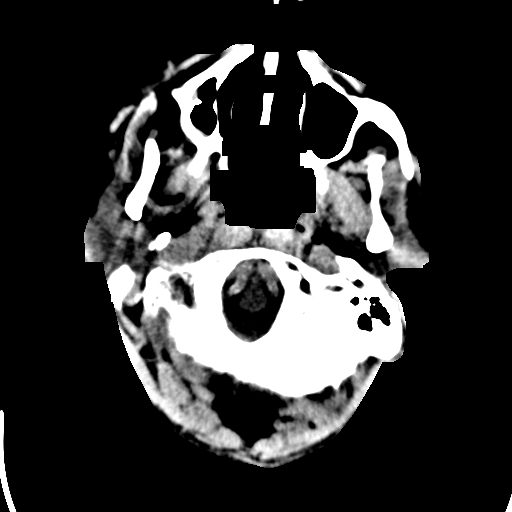
[im 6/32  brain]
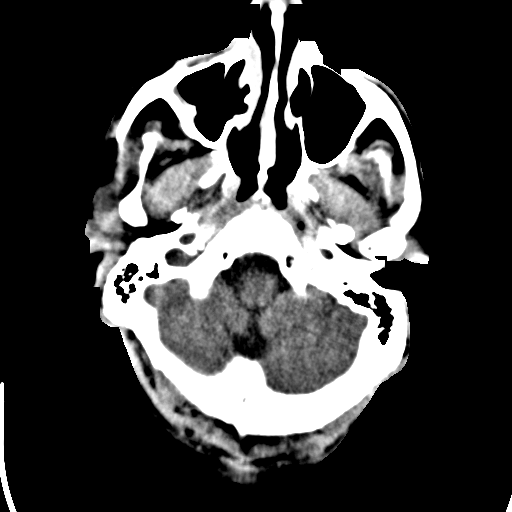
[im 8/32  brain]
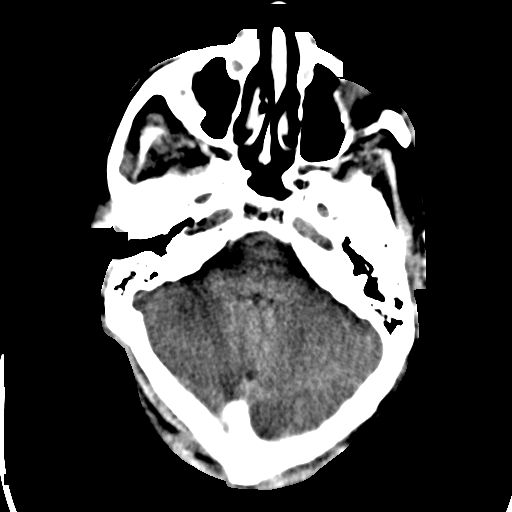
[im 10/32  brain]
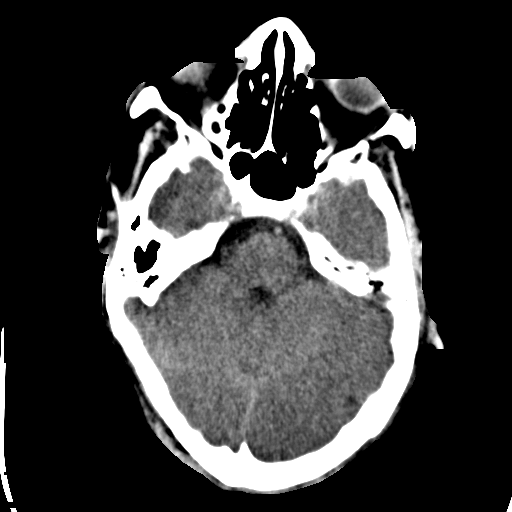
[im 10/32  bone]
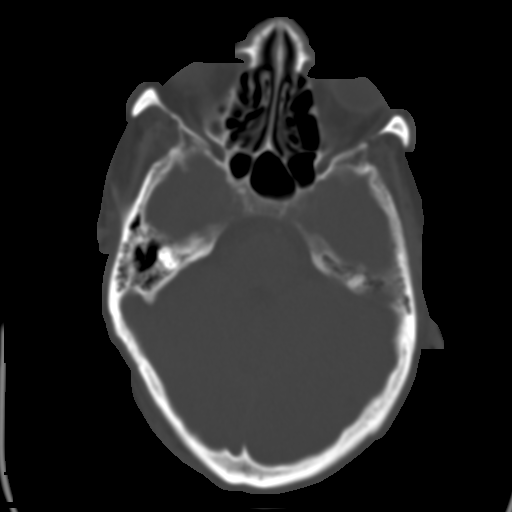
[im 12/32  brain]
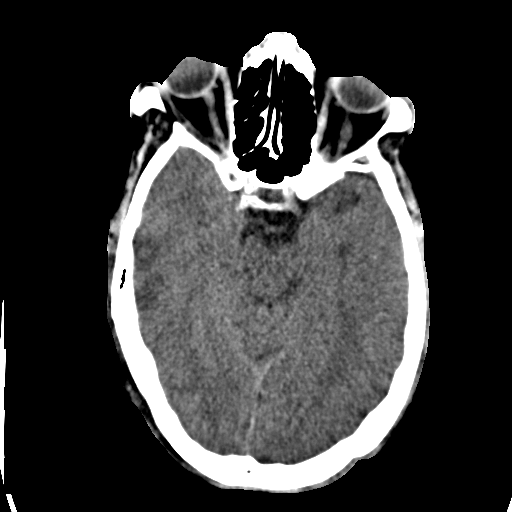
[im 14/32  brain]
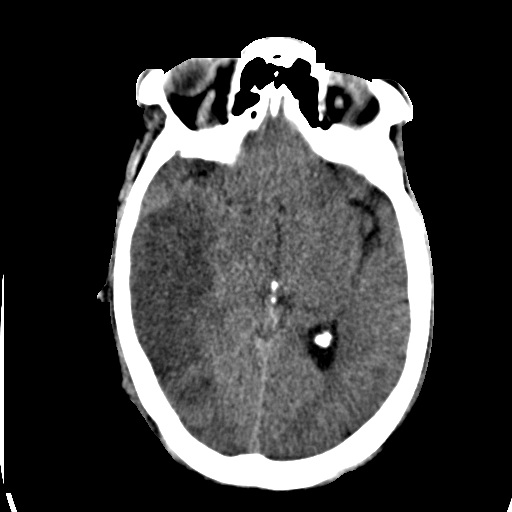
[im 17/32  brain]
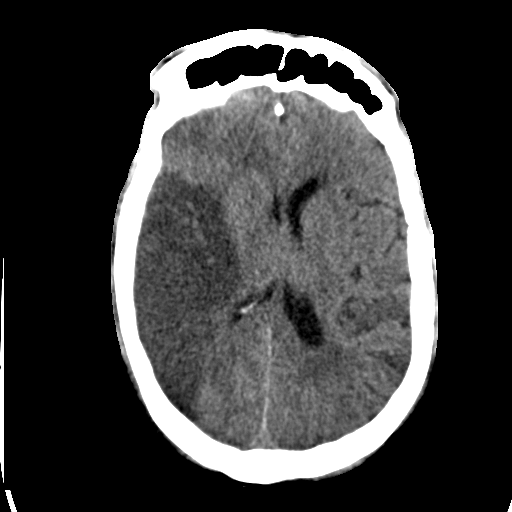
[im 18/32  brain]
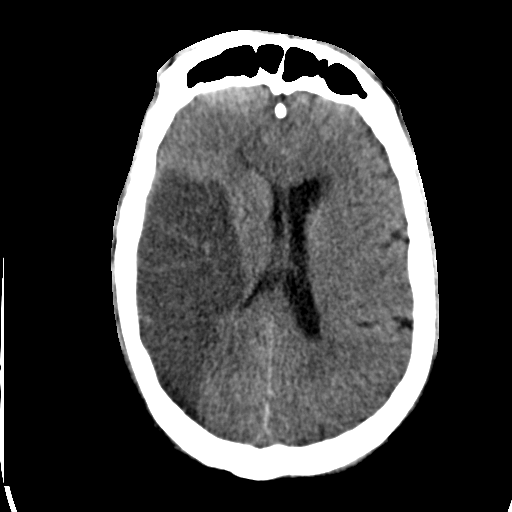
[im 18/32  bone]
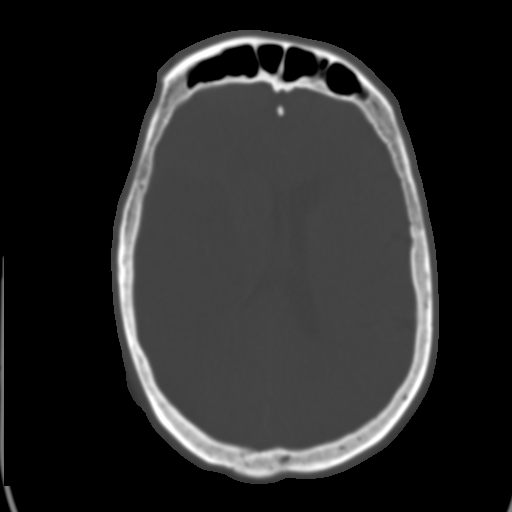
[im 20/32  brain]
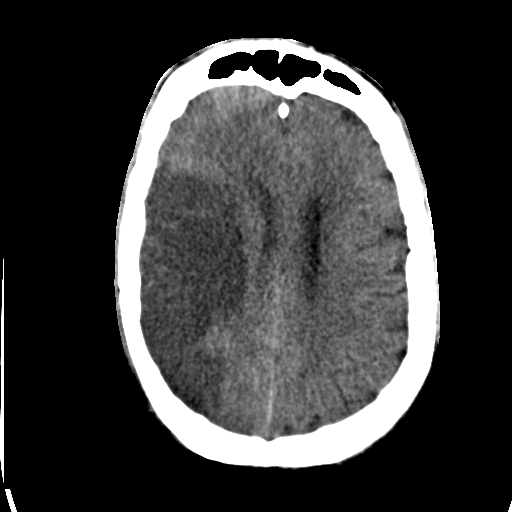
[im 22/32  brain]
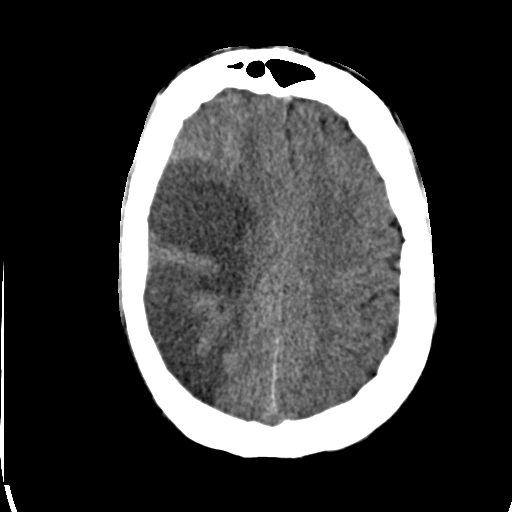
[im 24/32  brain]
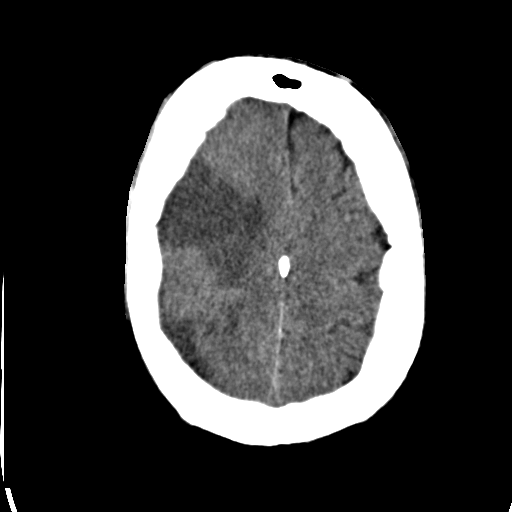
[im 26/32  brain]
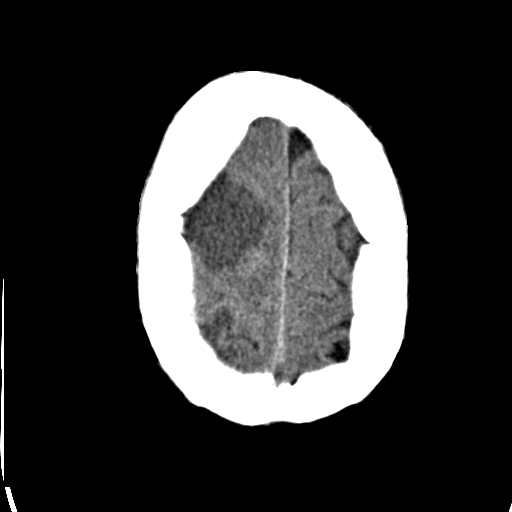
[im 26/32  bone]
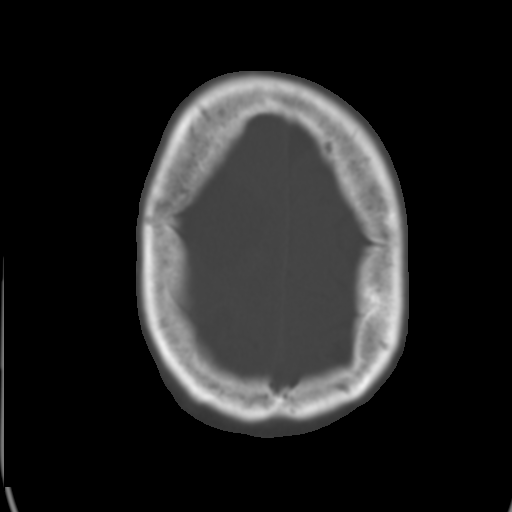
[im 28/32  brain]
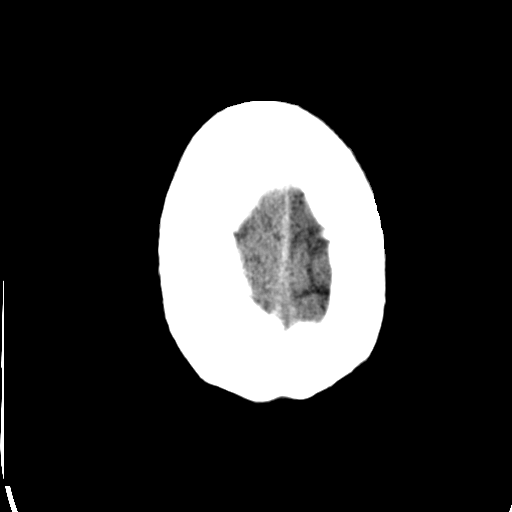
[im 30/32  brain]
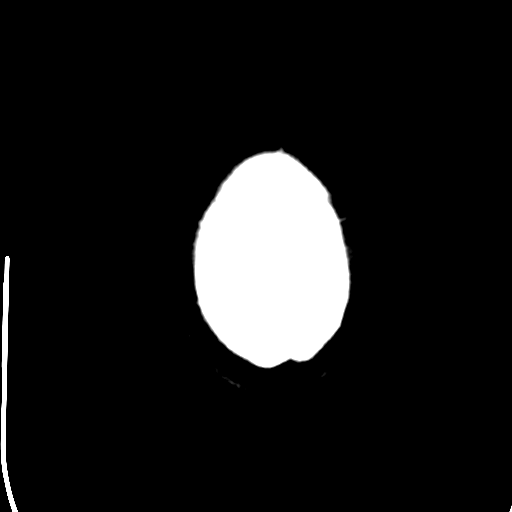

[15 of 30 positions shown; findings below may reference images not displayed]

FINDINGS: A diffuse area of low attenuation is appreciated involving the
temporoparietal region on the right. This is in an MCA distribution
consistent with an area of subacute infarction. There is evidence of partial
effacement of the right lateral ventricle and mild subfalcine herniation of
5.5 mm, consistent with the sequelae of cytotoxic and vasogenic edema. There
is no evidence of acute hemorrhage or evidence of intra-axial or extra-axial
fluid collections. Areas of low attenuation project within the subcortical,
deep, and periventricular white matter regions. These findings are diffuse.
There is no evidence of tonsillar herniation. There is no evidence of a
depressed skull fracture. The visualized paranasal sinuses and mastoid air
cells are patent.
IMPRESSION: Findings consistent with an area of infarction, MCA
distribution on the right minimal, with minimal subfalcine herniation. This
finding has the appearance of a subacute infarct particularly when compared
to previous study.

## 2015-10-04 IMAGING — CR DG CHEST 1V PORT
1 series · 1 of 1 positions shown · non-contrast
Comparison: Portable exam 3565 hr compared to 06/07/2013

CLINICAL DATA: Vomiting, recent aspiration pneumonia

EXAM:
PORTABLE CHEST - 1 VIEW

[ap]
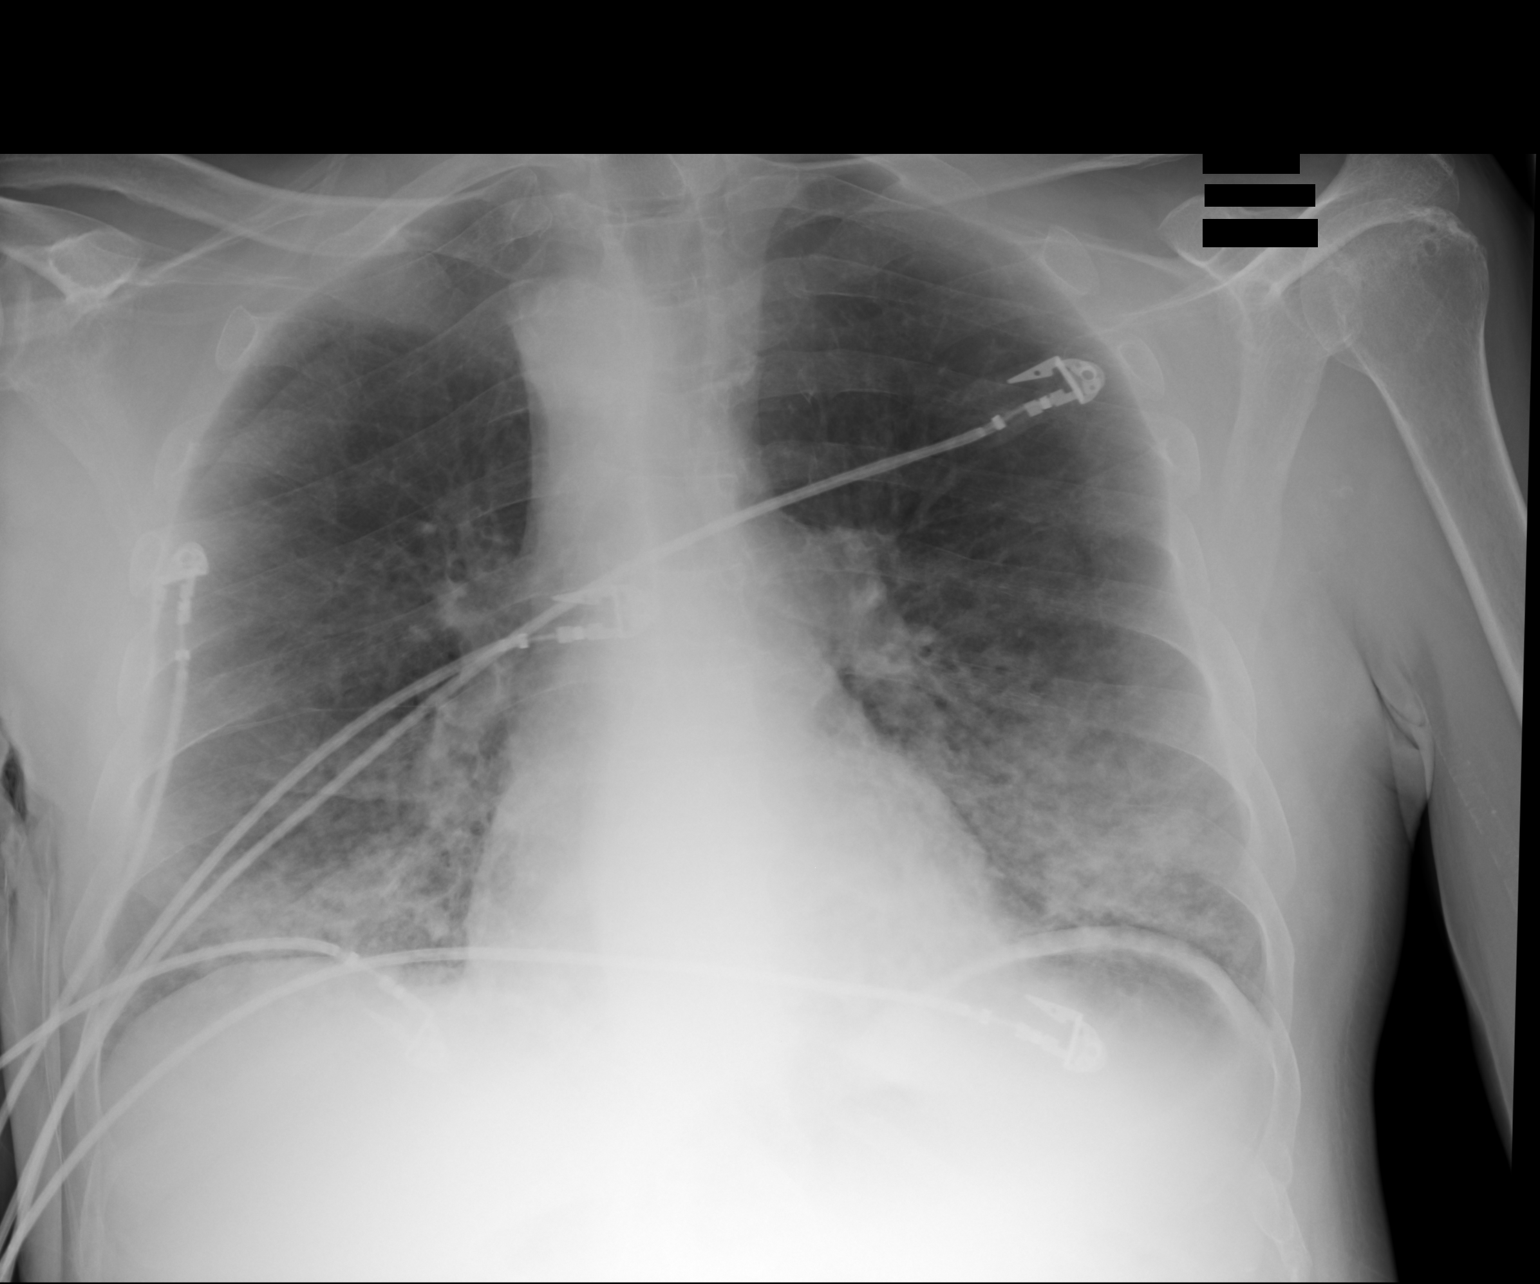

[1 of 1 positions shown; findings below may reference images not displayed]

FINDINGS: Normal heart size, mediastinal contours and pulmonary vascularity
for technique.

Bibasilar airspace infiltrates which could represent pneumonia or
aspiration.

Upper lungs clear.

Skin fold projects over upper right chest.

No pneumothorax.

Central peribronchial thickening noted.

Bones demineralized.
IMPRESSION: Bibasilar airspace infiltrates increased since previous exam
question pneumonia versus aspiration pneumonitis.

Bronchitic changes.

## 2015-10-04 IMAGING — CR DG ABDOMEN 2V
1 series · 4 of 4 positions shown · non-contrast
Comparison: 06/11/2013

CLINICAL DATA: Vomiting. Abdominal pain.

EXAM:
ABDOMEN - 2 VIEW

[Series 1: erect ap · 0.17mm/px · 4 of 4 slices shown]
[im 1/4]
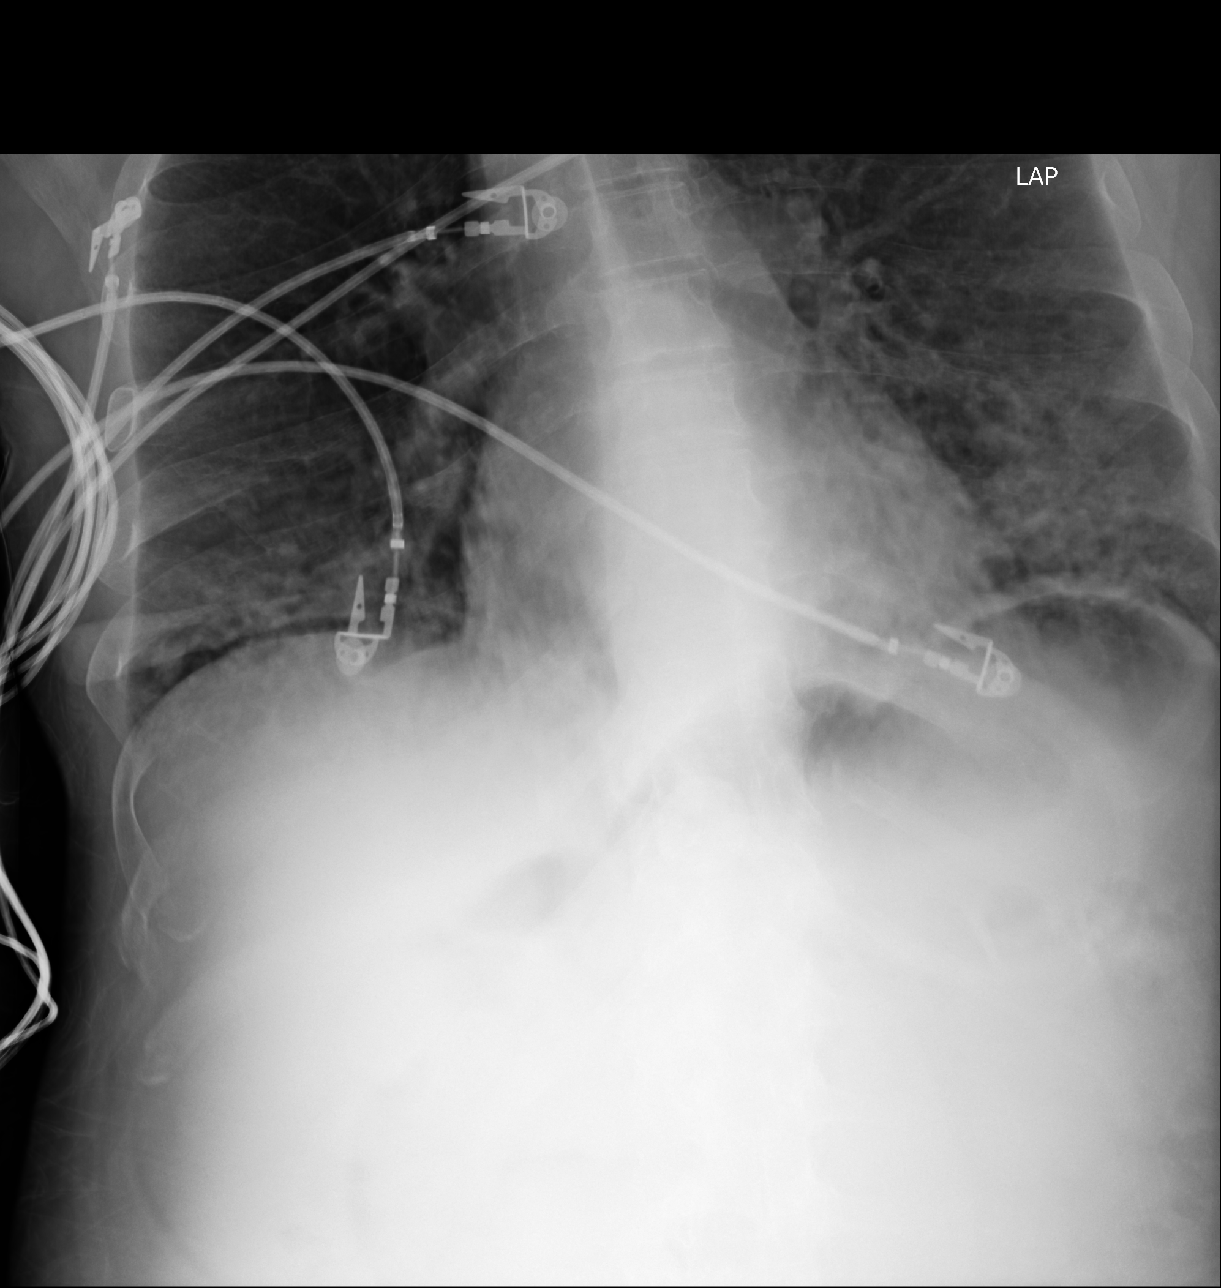
[im 2/4]
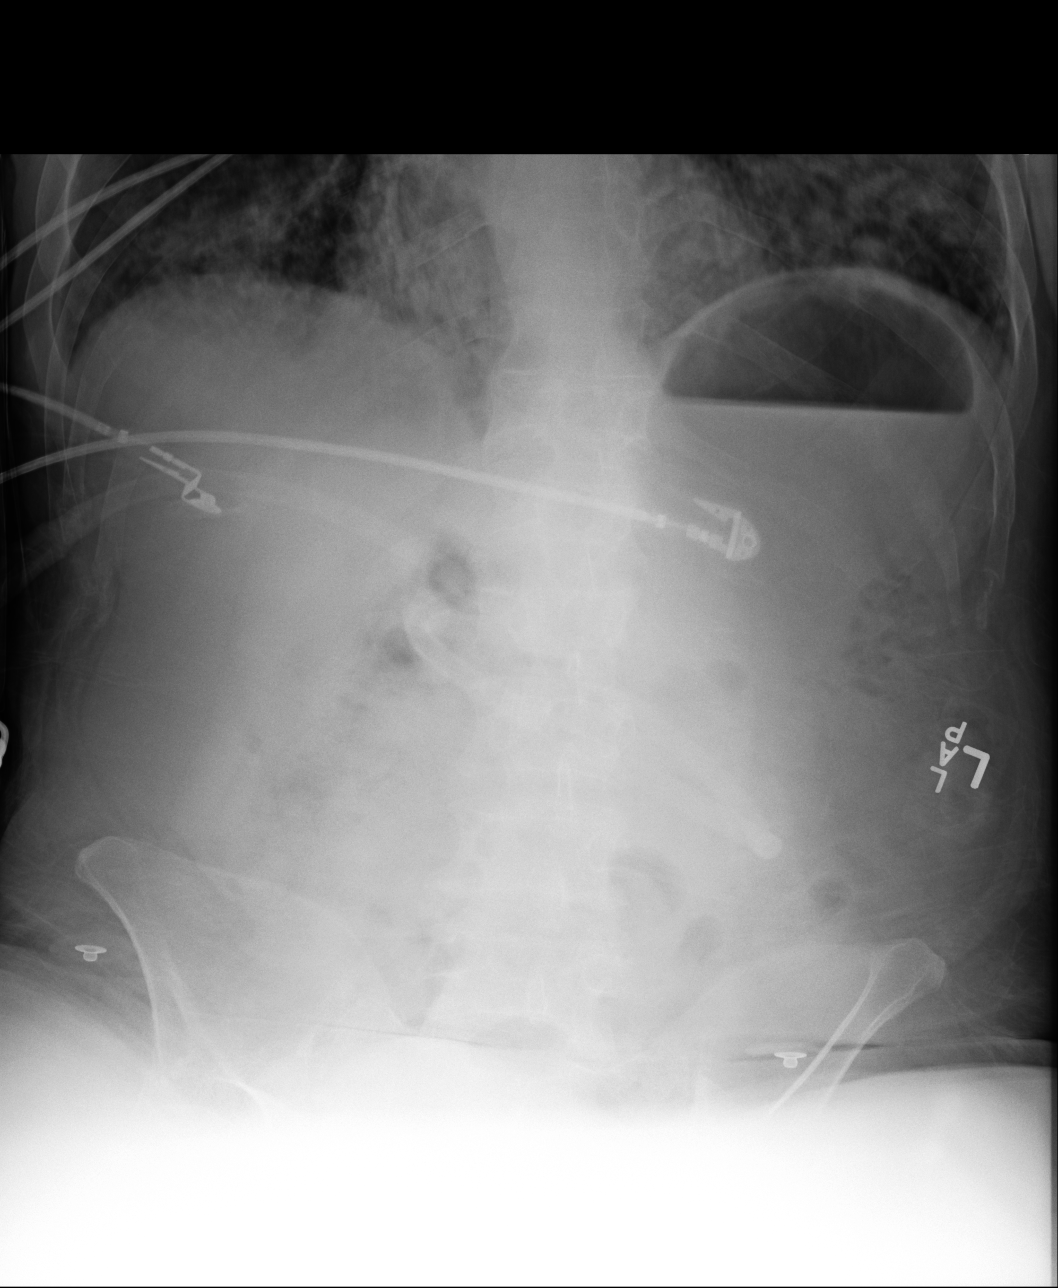
[im 3/4]
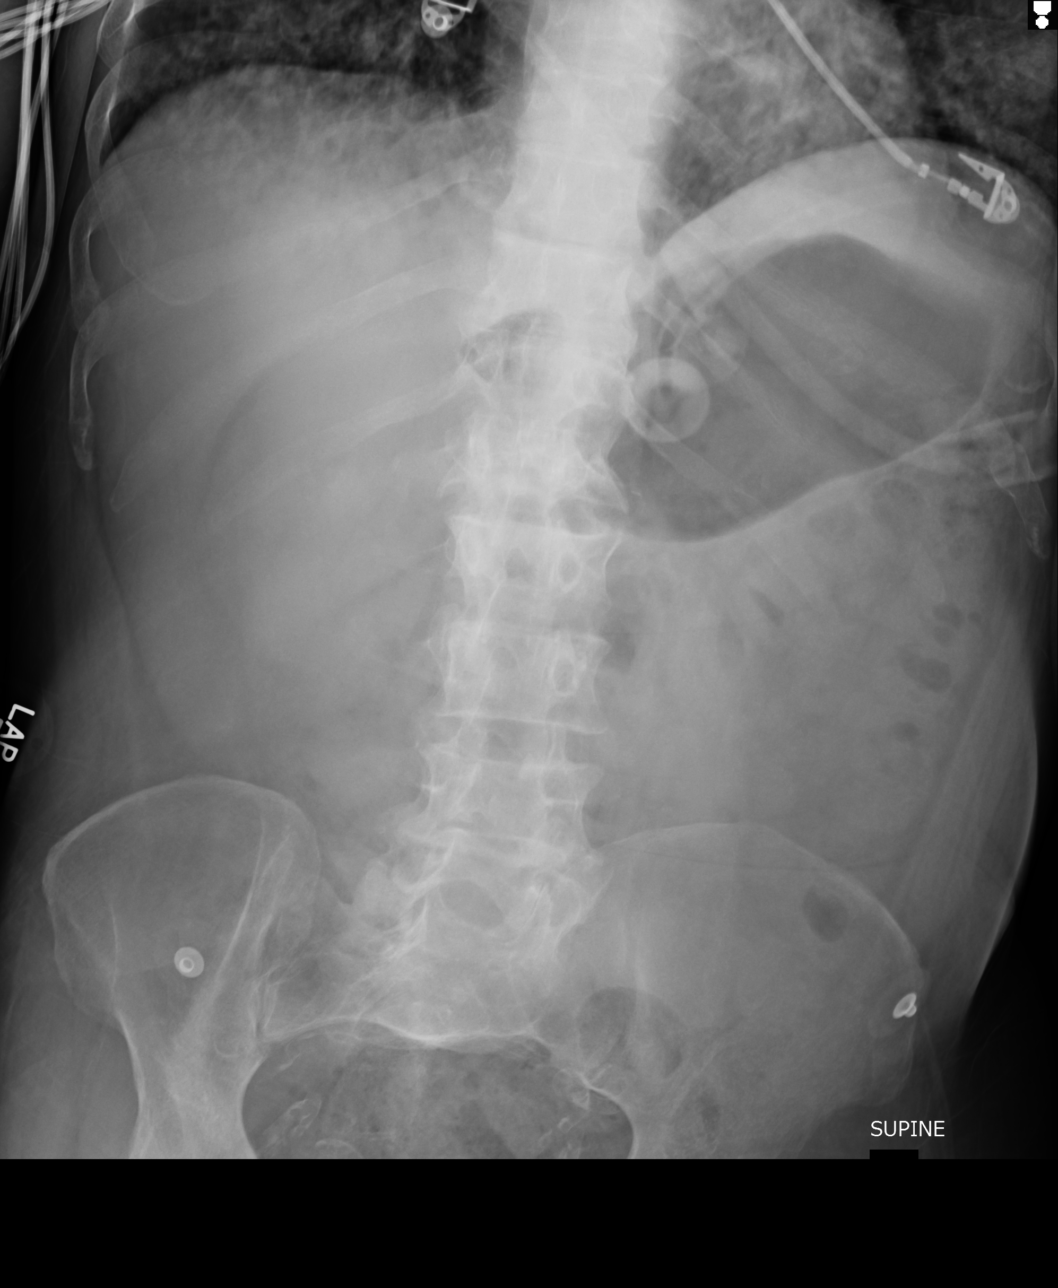
[im 4/4]
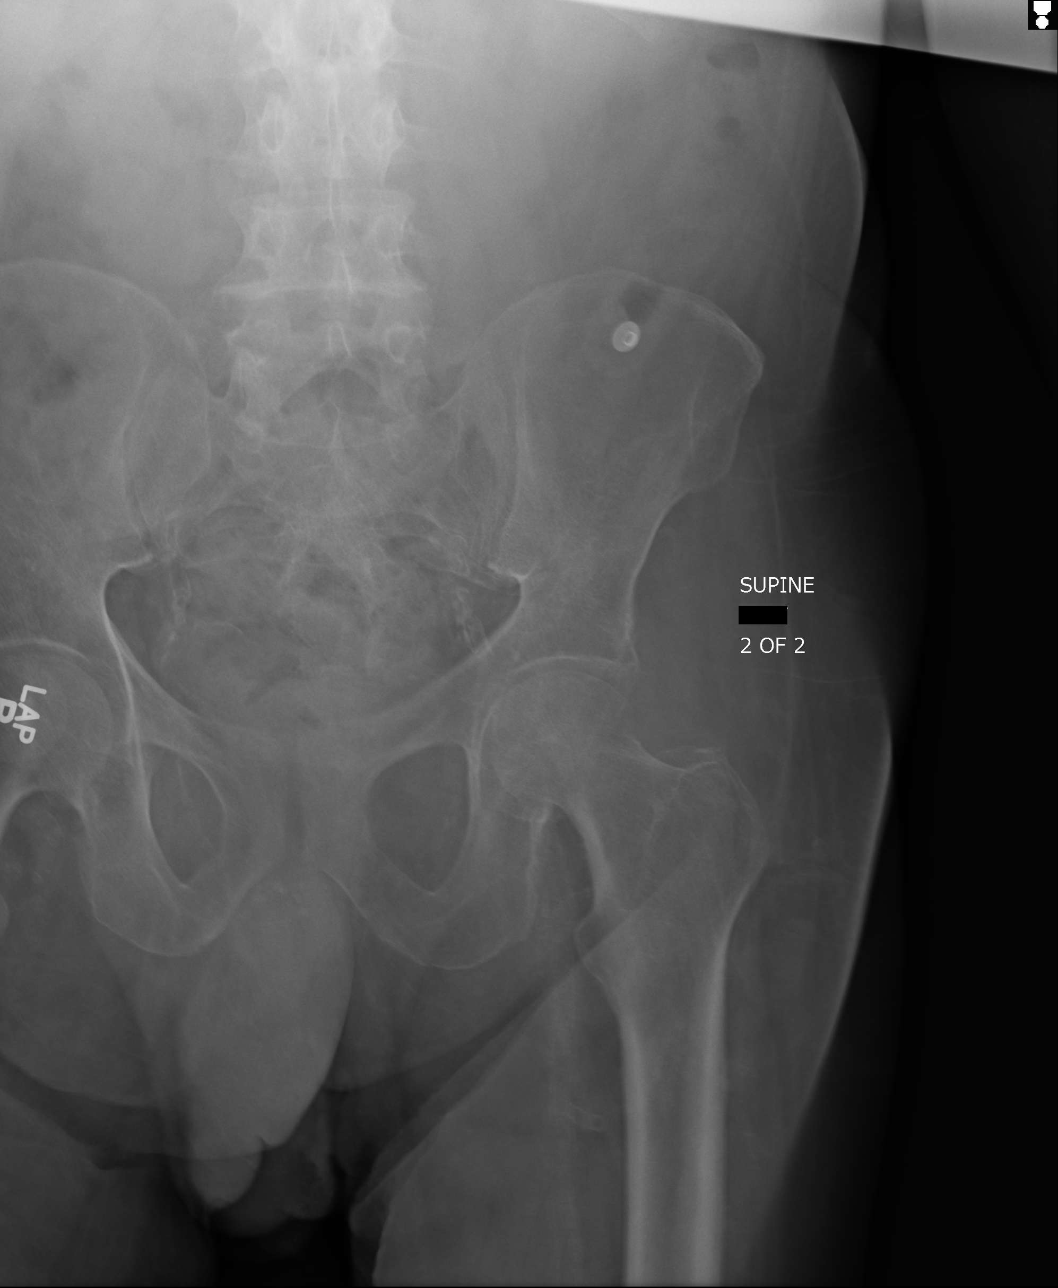

[4 of 4 positions shown; findings below may reference images not displayed]

FINDINGS: Upright film shows no evidence for intraperitoneal free air. Upright
film shows bibasilar atelectasis or infiltrate in the lower lobes.

Supine film shows a gastrostomy tube overlying the medial upper left
abdomen. There is no gaseous bowel dilatation to suggest
obstruction. Vascular calcification is evident over the anatomic
pelvis. Bones are diffusely demineralized.
IMPRESSION: No evidence for bowel obstruction or perforation.
